# Patient Record
Sex: Male | Born: 1945 | Race: White | Hispanic: No | Marital: Married | Smoking: Never smoker
Health system: Southern US, Community
[De-identification: ages and names within clinical notes are randomized; demographics above are authoritative.]

## PROBLEM LIST (undated history)

## (undated) DIAGNOSIS — C801 Malignant (primary) neoplasm, unspecified: Secondary | ICD-10-CM

## (undated) DIAGNOSIS — I8290 Acute embolism and thrombosis of unspecified vein: Secondary | ICD-10-CM

## (undated) DIAGNOSIS — Z973 Presence of spectacles and contact lenses: Secondary | ICD-10-CM

## (undated) DIAGNOSIS — R600 Localized edema: Secondary | ICD-10-CM

## (undated) DIAGNOSIS — N189 Chronic kidney disease, unspecified: Secondary | ICD-10-CM

## (undated) DIAGNOSIS — M1990 Unspecified osteoarthritis, unspecified site: Secondary | ICD-10-CM

## (undated) DIAGNOSIS — D649 Anemia, unspecified: Secondary | ICD-10-CM

## (undated) DIAGNOSIS — I10 Essential (primary) hypertension: Secondary | ICD-10-CM

## (undated) DIAGNOSIS — N400 Enlarged prostate without lower urinary tract symptoms: Secondary | ICD-10-CM

## (undated) DIAGNOSIS — K219 Gastro-esophageal reflux disease without esophagitis: Secondary | ICD-10-CM

## (undated) HISTORY — DX: Localized edema: R60.0

## (undated) HISTORY — PX: COLONOSCOPY: SHX174

## (undated) HISTORY — PX: CYSTOSCOPY WITH BIOPSY: SHX5122

## (undated) HISTORY — PX: TONSILLECTOMY: SUR1361

## (undated) HISTORY — PX: SHOULDER ARTHROSCOPY W/ ROTATOR CUFF REPAIR: SHX2400

---

## 2001-10-24 HISTORY — PX: TOTAL KNEE ARTHROPLASTY: SHX125

## 2002-02-26 ENCOUNTER — Encounter: Payer: Self-pay | Admitting: Orthopedic Surgery

## 2002-02-28 ENCOUNTER — Inpatient Hospital Stay (HOSPITAL_COMMUNITY): Admission: RE | Admit: 2002-02-28 | Discharge: 2002-03-03 | Payer: Self-pay | Admitting: Orthopedic Surgery

## 2002-02-28 ENCOUNTER — Encounter: Payer: Self-pay | Admitting: Orthopedic Surgery

## 2002-07-23 ENCOUNTER — Ambulatory Visit (HOSPITAL_COMMUNITY): Admission: RE | Admit: 2002-07-23 | Discharge: 2002-07-23 | Payer: Self-pay | Admitting: Orthopedic Surgery

## 2002-07-23 ENCOUNTER — Encounter: Payer: Self-pay | Admitting: Orthopedic Surgery

## 2003-01-07 ENCOUNTER — Encounter: Payer: Self-pay | Admitting: Orthopedic Surgery

## 2003-01-07 ENCOUNTER — Ambulatory Visit (HOSPITAL_COMMUNITY): Admission: RE | Admit: 2003-01-07 | Discharge: 2003-01-07 | Payer: Self-pay | Admitting: Orthopedic Surgery

## 2003-09-08 ENCOUNTER — Ambulatory Visit (HOSPITAL_COMMUNITY): Admission: RE | Admit: 2003-09-08 | Discharge: 2003-09-08 | Payer: Self-pay | Admitting: Orthopedic Surgery

## 2005-10-24 HISTORY — PX: HERNIA REPAIR: SHX51

## 2006-10-24 HISTORY — PX: TOTAL KNEE ARTHROPLASTY: SHX125

## 2006-10-30 ENCOUNTER — Inpatient Hospital Stay (HOSPITAL_COMMUNITY): Admission: RE | Admit: 2006-10-30 | Discharge: 2006-11-02 | Payer: Self-pay | Admitting: Orthopedic Surgery

## 2008-10-03 ENCOUNTER — Encounter: Admission: RE | Admit: 2008-10-03 | Discharge: 2008-10-03 | Payer: Self-pay | Admitting: Orthopedic Surgery

## 2010-10-24 HISTORY — PX: CARPAL TUNNEL RELEASE: SHX101

## 2011-03-11 NOTE — Discharge Summary (Signed)
NAMEJENS, SIEMS                ACCOUNT NO.:  [id number]   MEDICAL RECORD NO.:  [id number]          PATIENT TYPE:  INP   LOCATION:  5033                         FACILITY:  [facility]   PHYSICIAN:  Loreta Ave, M.D. DATE OF BIRTH:  [birth year]   DATE OF ADMISSION:  10/30/2006  DATE OF DISCHARGE:  11/02/2006                               DISCHARGE SUMMARY   FINAL DIAGNOSES:  1. Status post right total knee replacement for end-stage degenerative      joint disease.  2. Hypertension.   HISTORY OF PRESENT ILLNESS:  A 65 year old white male with history of  end-stage DJD, right knee, and chronic pain, who presented to our office  for preop evaluation for a total knee replacement.  He had progressively  worsening pain with failed response with conservative treatment.  Significant decrease in his daily activities due to the ongoing  complaint.   HOSPITAL COURSE:  On October 30, 2006, the patient was taken to the [facility] operating room and a right total knee replacement procedure  performed.  Surgeon Loreta Ave, M.D., and assistant Genene Churn.  Barry Dienes, P.A.-C.  Anesthesia general with femoral nerve block.  There were  no specimens.  EBL minimal.  Tourniquet time 2 hours.  One Hemovac drain  placed.  No surgical or anesthesia complications, and the patient was  transferred to recovery in stable condition.  October 31, 2006, patient  doing well with good pain control.  Vital signs stable and afebrile.  Hemoglobin 11.3, hematocrit 32.2.  Sodium 136, potassium 4.0, chloride  105, CO2 28, BUN 14, creatinine 1.0, glucose 155.  INR 1.3.  The  dressing was clean, dry, and intact.  Calf nontender, neurovascularly  intact distally.  Started on Pepcid 20 mg p.o. daily.  Discontinued  Foley.  PT and OT consults.  Pharmacy protocol Coumadin started.  01 November 2006, the patient looked great, progressing well with therapy.  Good pain control.  Sodium 137, potassium 3.7, chloride 102, CO2 31,  BUN  14, creatinine 1.0, glucose 107.  INR 1.4.  The wound looked good with  staples intact.  No drainage or signs of infection.  Hemovac drain fell  out the night before.  Calf nontender, neurovascularly intact.  Discontinued PCA, O2.  Saline-locked IV.  November 02, 2006, the patient  doing great without complaints.  States he is ready to go home.  Vital  signs stable, afebrile.  WBC 6.3, hemoglobin 9.9, hematocrit 28.2,  platelets 118.  Sodium 140, potassium 4.0, chloride 104, CO2 30, BUN 10,  creatinine 1.0, glucose of 99.  INR 1.5.  The wound looked good with  staples intact.  No drainage or signs of infection.  Calf nontender,  neurovascularly intact.   CONDITION:  Good and stable.   DISPOSITION:  Discharge home.   DISCHARGE MEDICATIONS:  1. Percocet 7.5/325 mg one to two tablets p.o. q.4-6h. p.r.n. pain.  2. Lovenox 40 mg subcutaneous injection daily x3 days.  3. Coumadin pharmacy protocol.  4. Robaxin 500 mg one tablet p.o. q.6h. p.r.n. for spasms.  5. Resume previous home medications.  INSTRUCTIONS:  The patient will work with home health PT/OT to improve  range of motion, strengthening and ambulation.  He will be on Coumadin  x4 weeks postop for DVT prophylaxis.  Daily dry dressing changes.  He is  weightbearing as tolerated.  He will follow up in the office 2 weeks  postop for recheck and possible staple removal.  Return sooner if  needed.      Genene Churn. Denton Meek.      Loreta Ave, M.D.  Electronically Signed    JMO/MEDQ  D:  12/26/2006  T:  12/26/2006  Job:  [id number]

## 2011-03-11 NOTE — Op Note (Signed)
Marc Maldonado, Marc Maldonado NO.:  [id number]   MEDICAL RECORD NO.:  [id number]          PATIENT TYPE:  INP   LOCATION:  5033                         FACILITY:  [facility]   PHYSICIAN:  Loreta Ave, M.D. DATE OF BIRTH:  1946-01-23   DATE OF PROCEDURE:  10/30/2006  DATE OF DISCHARGE:                               OPERATIVE REPORT   PREOPERATIVE DIAGNOSIS:  1. Degenerative arthritis, right knee.  2. Mild flexion contracture.   POSTOPERATIVE DIAGNOSIS:  1. Degenerative arthritis, right knee.  2. Mild flexion contracture.   OPERATIVE PROCEDURE:  Right total knee replacement, minimally invasive  system.  Stryker triathlon component.  Cemented #6 femoral component,  pegged posterior stabilized.  Cemented #7 tibial component with a 9-mm  posterior stabilized polyethelene insert.  Resurfacing 38 x 10 mm medial  offset patellar component.  Soft tissue balancing and medial capsular  release.   SURGEON:  Loreta Ave, M.D.   ASSISTANT:  Genene Churn. Denton Meek., present throughout the entire case.   ANESTHESIA:  General.   BLOOD LOSS:  Minimal.   TOURNIQUET TIME:  Two hours.   SPECIMENS:  None.   CULTURES:  None.   COMPLICATIONS:  None.   DRESSINGS:  Soft compressive with knee immobilizer.   DRAINS:  Hemovac x1.   PROCEDURE:  Patient brought to the operating room and after adequate  anesthesia had been obtained, the right knee examined.  A 3-5 degree  flexion contracture, further flexion 100.  Stable ligaments but very  stiff.  Tourniquet applied.  Prepped and draped in the usual, sterile  fashion.  Exsanguinated with elevation Esmarch.  Tourniquet inflated to  330 mmHg.  A longitudinal incision above the patella down to the tibial  tubercle.  A medial arthrotomy up to the superomedial aspect of the  patella and then a vastus splitting incision for the minimally invasive  system.  Knee exposed.  Grade 4 changes throughout.  Ran into some  menisci and cruciate  ligaments periarticular spurs and loose bodies  removed throughout.  Distal femur exposed.  Intramedullary guide placed.  Twelve millimeter dissection set at 5 degrees of valgus, which restored  mechanical access.  Femur was sized to a #6 component.  Epicondylar  access marked.  The jig was put in place for distal femoral resection  after appropriate sizing for a #6 component.  Definitive cuts made with  appropriate jigs.  When this was complete, the trial was put in place  and found to fit well with excellent capturing and alignment of the  femur.   Attention turned to the tibia.  Extramedullary guide placed.  A 3-degree  posterior slope cut removing 15 mm off the peek of the spine.  Sized to  a #7 component.  All recess examined.  All loose fragments removed.  All  hypertrophic synovial tissue removed.  Care was taken to go up  posteriorly as well.  Patella was then measured and 10 mm resected off  of the posterior aspect.  Sized for a 38 mm component and drill holes  made for that component.  Trials were  then put in place throughout.  A  #6 on the femur, #7 on the tibia and the 38 mm on the patella.  With the  9 mm polyethylene insert attached to the tibia, I had full extension,  full flexion, nicely balanced knee and excellent patellofemoral  tracking.  The tibia was marked for rotation and then hand reamed.  All  trials were removed.  Copious irrigation with the posterior irrigating  device.  Cement prepared, placed on all components which was then firmly  seated removing excessive cement.  Polyethylene attached to the tibia.  Once the cement hardened, the knee was examined.  Full extension, full  flexion.  Good patellofemoral tracking.  As we had reached two hours,  the tourniquet was deflated.  Hemostasis with cautery.  Hemovac placed  and brought out through a separate stab wound.  Wound irrigated once  again.  Arthrotomy closed with #1 Vicryl.  Skin and subcutaneous tissue  with  Vicryl and staples.  Knee injected with Marcaine.  Hemovac clamped.  Sterile compressive dressing applied.  Tourniquet removed.  Knee  immobilizer applied.  Anesthesia reversed.  Brought to recovery room.  Tolerated the surgery well.  No complications.      Loreta Ave, M.D.  Electronically Signed     DFM/MEDQ  D:  10/30/2006  T:  10/31/2006  Job:  [id number]

## 2011-03-11 NOTE — Op Note (Signed)
[place]. [facility]  Patient:    Marc Maldonado, Marc Maldonado Visit Number: [id number] MRN: [id number]          Service Type: SUR Location: 5000 5010 [id number] Attending Physician:  Colbert Ewing Dictated by:   Kaylyn Layer Michelle Piper, M.D. Proc. Date: 02/28/02 Admit Date:  02/28/2002   CC:         Loreta Ave, M.D.   Operative Report  PROCEDURE:  Epidural catheter placement for postoperative pain relief.  DESCRIPTION OF PROCEDURE:  I was consulted by Dr. Eulah Pont to place an epidural catheter into [name] for postoperative pain relief.  I had the opportunity to discuss the risks and benefits with the patient preoperatively, and he agreed to the catheter placement.  At the end of the procedure prior to emergence from anesthesia, the patient was turned in the left lateral decubitus position.  His back was prepped with Betadine.  Using a #17 Tuohy needle, the epidural space was cannulated at the L3-4 interspace in the midline using loss of resistance technique.  The catheter was then threaded 5 cm into the epidural space and the needle removed.  After negative aspiration, 100 mcg of fentanyl and 10 cc of 0.5% Xylocaine was incrementally injected.  The catheter was firmly affixed to the patients back.  He was turned supine, extubated, and taken to recovery in stable condition.  In the PACU, a fentanyl/Marcaine infusion was begun.  He will be followed on the floor by the anesthesiology service. Dictated by:   Kaylyn Layer Michelle Piper, M.D. Attending Physician:  Colbert Ewing DD:  02/28/02 TD:  03/02/02 Job: [phone]

## 2011-03-11 NOTE — Discharge Summary (Signed)
[place]. [facility]  Patient:    Marc Maldonado, Marc Maldonado Visit Number: [id number] MRN: [id number]          Service Type: SUR Location: 5000 5010 [id number] Attending Physician:  Colbert Ewing Dictated by:   Oris Drone Petrarca, P.A.-C. Admit Date:  02/28/2002 Discharge Date: 03/03/2002                             Discharge Summary  ADMISSION DIAGNOSIS:  Advanced degenerative joint disease of the left knee.  DISCHARGE DIAGNOSES: 1. Advanced degenerative joint disease of the left knee. 2. Hypertension. 3. Posthemorrhagic anemia.  OPERATIVE PROCEDURE:  Left total knee replacement.  HISTORY:  A 65 year old male with progressive pain secondary to post-traumatic DJD of the left knee.  He is now having pain with activities of daily living, as well as with every step.  He has significant bone-on-bone, end-stage DJD radiographically.  Now indicated for left total knee replacement.  HOSPITAL COURSE:  This 65 year old white male admitted Feb 28, 2002 after appropriate laboratory studies were obtained as well as 1 gram Ancef IV on call to the operating room.  He was taken to the operating room where he underwent a left total knee replacement.  He tolerated the procedure well.  A Foley was placed intraoperatively.  Continued on Ancef 1 g IV q.8h. x3 doses. Epidural was placed for postoperative pain management.  Heparin 5000 units subcu q.12h. until his Coumadin became therapeutic.  Continued on Tiazac 240 mg daily for his blood pressure.  Hemovacs were placed.  CPM 0-30 degrees 8-10 hours a daily, increasing daily by 10 degrees.  Knee immobilizer for ambulation.  PT, OT, and rehabilitation consults were given.  He is allowed to be weight bearing as tolerated in physical therapy.  He was allowed out of bed to a chair the following day.  His epidural was discontinued on the 10th.  Dressing changes at that time revealed the wound to be benign.  No signs of infection.  The  remainder of his hospital course was uneventful, and he was discharged on May 11, to return back to the office in 10 days for stable removal.  EKG revealed sinus bradycardia with occasional premature atrial complexes, otherwise normal.  Preoperative hemoglobin 14.8, hematocrit 42.0%, white count 6500, platelets 193,000.  Discharge hemoglobin 9.5, hematocrit 26.6%. Chemistries Preop:  Sodium 140, potassium 4.3, chloride 107, CO2 27, glucose 96, BUN 15, creatinine 1.1, calcium 10.0, total protein 6.2, albumin 4.1, AST 27, ALT 24, ALP 56, bilirubin 0.9.  Discharge sodium 138, potassium 3.6, chloride 103, CO2 29, glucose 116, BUN 8, creatinine 1.1, calcium 8.2. Urinalysis was benign for a voided urine.  DISCHARGE MEDICATIONS: 1. He is to continue with his Tiazac as previous. 2. Percocet 5 mg 1-2 tabs q.4-6h. p.r.n. pain. 3. Trinsicon 1 tab 3 times a day with meals. 4. Coumadin 7.5 mg daily; Genevieve Norlander will continue to monitor this.  DISCHARGE INSTRUCTIONS:  He is allowed out of the bed and ambulate with his crutches, weight bearing as tolerated.  No restriction in his diet.  He is to keep his wound clean and dry.  Call if there are any signs of infection. Physical therapy will be started at home.  Blood draws by [place].  He will call is he has any difficulties.  Otherwise will follow up back in the office in about 10 days for staple removal. Dictated by:   Oris Drone. Petrarca, P.A.-C. Attending  Physician:  Colbert Ewing DD:  03/10/02 TD:  03/13/02 Job: [id number]

## 2011-03-11 NOTE — Op Note (Signed)
[place]. [facility]  Patient:    Marc Maldonado, Marc Maldonado Visit Number: [id number] MRN: [id number]          Service Type: SUR Location: 5000 5010 01 Attending Physician:  Colbert Ewing Dictated by:   Loreta Ave, M.D. Proc. Date: 02/28/02 Admit Date:  02/28/2002                             Operative Report  PREOPERATIVE DIAGNOSIS:  End-stage degenerative arthritis - left knee.  POSTOPERATIVE DIAGNOSIS:   End-stage degenerative arthritis - left knee.  OPERATIVE PROCEDURE:  Left total knee replacement with Osteonics prosthesis, spur and loose body removal, appropriate soft tissue balancing, a #11 posterior stabilizing femoral component, cemented #11 tibial component with 12 mm polyethylene insert, and cemented, recessed nonmetal-backed 30 mm patellar component.  SURGEON:  Loreta Ave, M.D.  ASSISTANT:  Arlys John D. Petrarca, P.A.-C.  ANESTHESIA:  General.  BLOOD LOSS:  Minimal.  SPECIMENS:  Excised bone and soft tissue.  CULTURES:  None.  COMPLICATIONS:  None.  DRESSING:  Soft compressive with knee immobilizer.  TOURNIQUET TIME:  80 minutes.  DRAINS:  Hemovac x 2.  DESCRIPTION OF PROCEDURE:  The patient was brought to the operating room and after adequate anesthesia had been obtained, the left knee was examined.  Five degree or more flexion contracture.  Alignment 5-10 degrees of varus barely correctable to neutral.  Stable ligaments.  Further flexion 100 degrees.  The tourniquet was applied, and prepped and draped in usual sterile fashion. Exsanguinated with elevation and Esmarch.  The tourniquet was inflated to 350 mmHg.  Straight incision above the patella down to the tibial tubercle. Medial parapatellar arthrotomy.  The knee was exposed.  Marked reactive effusion was drained.  Numerous loose bodies, hypertrophic synovitis, periarticular spurs were removed throughout.  Grade IV changes throughout. Medial capsular release in  order to achieve a balanced knee taking down the medial capsule off the tibia for the most part.  Distal femur was exposed. The intramedullary guide was placed.  Distal cut set at 5 degrees of valgus removing 10 mm.  Sized for a #11 component.  Jigs put in place, definitive cuts made for the posterior stabilizing component.  Trial put in place and found to fit well.  Trial removed.  Tibial spines were removed with the saw. Collaterals and posterior structures were protected.  The tibia was then cut proximally with the intramedullary guide placed removing 6 mm off the deficient medial side.  A parallel cut.  Five degree posterior slope.  Once complete, this was sized for a #11 component.  The patella was then sized, reamed, and drilled for a 30 mm component.  Marked spurring throughout debrided.  Trials were put on the femur, tibia, and patella, #11 on the femur, #11 on the tibia, and 30 mm on the patella.  A 12 mm spacer was placed.  Full extension, full flexion, good alignment, good stability, good tracking, nicely balanced knee.  The tibia was marked for appropriate rotation and then hand reamed for the component.  All trials were removed.  Copious irrigation with a pulse irrigating device.  Cement was prepared and placed on the tibial component, which was hammered in place.  Excess cement was removed. Polyethylene attached.  The femoral component was seated.  The patella component had cement applied and then was seated down into place and held with a clamp.  All excessive cement had been  removed.  The entire knee was once again was irrigated.  Once the cement had hardened, the knee was irrigated again.  Hemovacs were placed and brought out through separate stab wounds. Reexamined with good motion, good stability, good alignment with full extension and full flexion.  Hemovacs which had been placed were clamped. Arthrotomy closed with #1 Vicryl.  Skin and subcutaneous tissue with  Vicryl and staples.  Margins of the wound and knee injected with Marcaine.  Sterile compressive dressing applied.  Tourniquet deflated and removed.  Knee immobilizer applied.  Anesthesia was reversed, brought to recovery room. Tolerated surgery well, no complications. Dictated by:   Loreta Ave, M.D. Attending Physician:  Colbert Ewing DD:  02/28/02 TD:  03/02/02 Job: [id number]

## 2011-12-06 ENCOUNTER — Other Ambulatory Visit (HOSPITAL_COMMUNITY): Payer: Self-pay | Admitting: Orthopedic Surgery

## 2011-12-06 DIAGNOSIS — M25569 Pain in unspecified knee: Secondary | ICD-10-CM

## 2011-12-09 ENCOUNTER — Encounter (HOSPITAL_COMMUNITY)
Admission: RE | Admit: 2011-12-09 | Discharge: 2011-12-09 | Disposition: A | Discharge status: Home / Self Care | Payer: Medicare Other | Source: Ambulatory Visit | Attending: Orthopedic Surgery | Admitting: Orthopedic Surgery

## 2011-12-09 DIAGNOSIS — M25569 Pain in unspecified knee: Secondary | ICD-10-CM | POA: Insufficient documentation

## 2011-12-09 MED ORDER — TECHNETIUM TC 99M MEDRONATE IV KIT
25.6000 | PACK | Freq: Once | INTRAVENOUS | Status: AC | PRN
  Administered 2011-12-09: 25.6 via INTRAVENOUS

## 2012-05-14 ENCOUNTER — Other Ambulatory Visit: Payer: Self-pay | Admitting: Orthopedic Surgery

## 2012-05-14 DIAGNOSIS — M25512 Pain in left shoulder: Secondary | ICD-10-CM

## 2012-05-18 ENCOUNTER — Other Ambulatory Visit: Payer: Medicare Other

## 2012-05-21 ENCOUNTER — Ambulatory Visit
Admission: RE | Admit: 2012-05-21 | Discharge: 2012-05-21 | Disposition: A | Discharge status: Home / Self Care | Payer: Medicare Other | Source: Ambulatory Visit | Attending: Orthopedic Surgery | Admitting: Orthopedic Surgery

## 2012-05-21 DIAGNOSIS — M25512 Pain in left shoulder: Secondary | ICD-10-CM

## 2012-05-22 ENCOUNTER — Other Ambulatory Visit: Payer: Medicare Other

## 2012-06-01 ENCOUNTER — Encounter (HOSPITAL_BASED_OUTPATIENT_CLINIC_OR_DEPARTMENT_OTHER): Payer: Self-pay | Admitting: *Deleted

## 2012-06-01 NOTE — Progress Notes (Signed)
Pt has had multiple ortho procedures with dr Coralie Common last shoulder SCG-did well To come in early for istat and maybe ekg

## 2012-06-06 NOTE — H&P (Signed)
[place] [address]   [address] [place], [place] [address] [phone] A Division of [facility]  Loreta Ave, M.D.     Robert A. Thurston Hole, M.D.     Lunette Stands, M.D. Eulas Post, M.D.    Buford Dresser, M.D. Estell Harpin, M.D. Genene Churn. Barry Dienes, PA-C            Kirstin A. Shepperson, PA-C [place], OPA-C   RE: [place], [place]                                [phone]      DOB: 04-26-1946 PROGRESS NOTE: 04-24-12 [age] year-old white male who is one week status post right carpal tunnel release.  Returns.  States that his hand is doing well.  He does have a new complaint today of left shoulder pain.  About three weeks ago he put his left hand against a wall to help support himself and he felt a sharp pain in the anterior and lateral aspect of his shoulder.  No previous problems before onset.  Currently has pain with overhead activity and reaching behind his back.  It does bother him at night when he lies on his left side.  No cervical spine or radicular component.  He does have some feeling of popping in his shoulder with movement, but no feeling of instability.    EXAMINATION: Pleasant white male, alert and oriented x 3 and in no acute distress.  Cervical spine unremarkable.  Left shoulder he has good range of motion, but with discomfort.  Positive impingement test.  He is tender over the proximal biceps tendon, but I do not feel an obvious defect.  No swelling or bruising noted.  Right hand wound looks good and sutures intact.  No drainage or signs of infection.  Not much swelling.      X-RAYS: Left shoulder, AP, outlet and axillary views, show a Type II acromion with some AC degenerative changes.  No acute findings.    DISPOSITION:  To try to help give him some relief with his shoulder pain we offered injection.  He will follow up in one week for recheck of his right hand and suture removal.  We will  reevaluate his left shoulder at that time.  States that he does have a wrist splint at home and he will use this.  Do gentle range of motion of his wrist and fingers.    PROCEDURE NOTE: The patient's clinical condition is marked by substantial pain and/or significant functional disability.  Other conservative therapy has not provided relief, is contraindicated, or not appropriate.  There is a reasonable likelihood that injection will significantly improve the patient's pain and/or functional disability. After patient consent the left shoulder was prepped with Betadine after using 3 cc of 1% Xylocaine for local anesthetic, subacromial 1:4 Depo-Medrol/Marcaine injection performed from a posterolateral approach.  Tolerated procedure well without complication.   Genene Churn. Barry Dienes, PA-C   Electronically verified by Loreta Ave, M.D. JMO:jjh   [facility] [address]   [address] , [place] [address] [phone] A Division of [facility]  Loreta Ave, M.D.     Robert A. Thurston Hole, M.D.     Lunette Stands, M.D. Eulas Post, M.D.    Buford Dresser, M.D. Estell Harpin, M.D. Genene Churn. Barry Dienes, PA-C  Kirstin A. Shepperson, PA-C [place], OPA-C   RE: [place], [place]                                [phone]      DOB: 10-Jul-1946 PROGRESS NOTE: 05-29-12 Kinsley comes in for follow up.  Left carpal tunnel release by me on May 14, 2012.  Doing well.  No complaints.  Coming in for suture removal.   The other issue is his left shoulder.  Documented issues in the past.  I have gone over his MRI and report.  Shown and discussed it with him.  Full thickness cuff tear, 2 centimeters retraction, but only mild atrophy.  Of note, when we did his right shoulder back in 2009 he had a similar picture, but 4 centimeters of atrophy.  That was repaired arthroscopically with very good outcome.  No symptoms there.  Good motion.   Good strength.  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  He has no symptoms or complaints there.  I have discussed elective excision at the time of his rotator cuff repair, but he and I both agree there are not enough symptoms to warrant adding another incision there.     DISPOSITION:  1. In regards to carpal tunnel, sutures have been removed.  His wound looks good.  Rehab on his own.  Formal recheck in four weeks. 2. In regards to his shoulder, more than 25 minutes spent face-to-face covering his workup and treatment to date.  Going over his scan and report and showing it to him.  Going through his exam as well.  Proceed with definitive treatment.  Exam under anesthesia, arthroscopy, decompression and cuff repair.  We are not going to do an open excision of his lipoma, as it is not indicated.  He understands and agrees.    Loreta Ave, M.D.   Electronically verified by Loreta Ave, M.D. DFM:jjh

## 2012-06-07 ENCOUNTER — Encounter (HOSPITAL_BASED_OUTPATIENT_CLINIC_OR_DEPARTMENT_OTHER)
Admission: RE | Disposition: A | Discharge status: Home / Self Care | Payer: Self-pay | Source: Ambulatory Visit | Attending: Orthopedic Surgery

## 2012-06-07 ENCOUNTER — Encounter (HOSPITAL_BASED_OUTPATIENT_CLINIC_OR_DEPARTMENT_OTHER): Payer: Self-pay | Admitting: Anesthesiology

## 2012-06-07 ENCOUNTER — Ambulatory Visit (HOSPITAL_BASED_OUTPATIENT_CLINIC_OR_DEPARTMENT_OTHER)
Admission: RE | Admit: 2012-06-07 | Discharge: 2012-06-07 | Disposition: A | Discharge status: Home / Self Care | Payer: Medicare Other | Source: Ambulatory Visit | Attending: Orthopedic Surgery | Admitting: Orthopedic Surgery

## 2012-06-07 ENCOUNTER — Ambulatory Visit (HOSPITAL_BASED_OUTPATIENT_CLINIC_OR_DEPARTMENT_OTHER): Payer: Medicare Other | Admitting: Anesthesiology

## 2012-06-07 ENCOUNTER — Encounter (HOSPITAL_BASED_OUTPATIENT_CLINIC_OR_DEPARTMENT_OTHER): Payer: Self-pay

## 2012-06-07 DIAGNOSIS — M19019 Primary osteoarthritis, unspecified shoulder: Secondary | ICD-10-CM | POA: Insufficient documentation

## 2012-06-07 DIAGNOSIS — I10 Essential (primary) hypertension: Secondary | ICD-10-CM | POA: Insufficient documentation

## 2012-06-07 DIAGNOSIS — M75120 Complete rotator cuff tear or rupture of unspecified shoulder, not specified as traumatic: Secondary | ICD-10-CM | POA: Insufficient documentation

## 2012-06-07 DIAGNOSIS — M25819 Other specified joint disorders, unspecified shoulder: Secondary | ICD-10-CM | POA: Insufficient documentation

## 2012-06-07 DIAGNOSIS — Z4789 Encounter for other orthopedic aftercare: Secondary | ICD-10-CM

## 2012-06-07 DIAGNOSIS — M66329 Spontaneous rupture of flexor tendons, unspecified upper arm: Secondary | ICD-10-CM | POA: Insufficient documentation

## 2012-06-07 HISTORY — DX: Essential (primary) hypertension: I10

## 2012-06-07 HISTORY — DX: Benign prostatic hyperplasia without lower urinary tract symptoms: N40.0

## 2012-06-07 HISTORY — DX: Presence of spectacles and contact lenses: Z97.3

## 2012-06-07 HISTORY — DX: Unspecified osteoarthritis, unspecified site: M19.90

## 2012-06-07 SURGERY — SHOULDER ARTHROSCOPY WITH ROTATOR CUFF REPAIR AND SUBACROMIAL DECOMPRESSION
Anesthesia: General | Site: Shoulder | Laterality: Left | Wound class: Clean

## 2012-06-07 MED ORDER — ONDANSETRON HCL 4 MG/2ML IJ SOLN
INTRAMUSCULAR | Status: DC | PRN
  Administered 2012-06-07: 4 mg via INTRAVENOUS

## 2012-06-07 MED ORDER — FENTANYL CITRATE 0.05 MG/ML IJ SOLN
50.0000 ug | INTRAMUSCULAR | Status: DC | PRN
  Administered 2012-06-07: 100 ug via INTRAVENOUS

## 2012-06-07 MED ORDER — EPHEDRINE SULFATE 50 MG/ML IJ SOLN
INTRAMUSCULAR | Status: DC | PRN
  Administered 2012-06-07 (×3): 10 mg via INTRAVENOUS

## 2012-06-07 MED ORDER — PROPOFOL 10 MG/ML IV EMUL
INTRAVENOUS | Status: DC | PRN
  Administered 2012-06-07: 250 mg via INTRAVENOUS

## 2012-06-07 MED ORDER — MIDAZOLAM HCL 2 MG/2ML IJ SOLN
1.0000 mg | INTRAMUSCULAR | Status: DC | PRN
  Administered 2012-06-07: 2 mg via INTRAVENOUS

## 2012-06-07 MED ORDER — GLYCOPYRROLATE 0.2 MG/ML IJ SOLN
INTRAMUSCULAR | Status: DC | PRN
  Administered 2012-06-07: 0.2 mg via INTRAVENOUS

## 2012-06-07 MED ORDER — CEFAZOLIN SODIUM 1-5 GM-% IV SOLN
1.0000 g | INTRAVENOUS | Status: AC
  Administered 2012-06-07: 2 g via INTRAVENOUS

## 2012-06-07 MED ORDER — LACTATED RINGERS IV SOLN
INTRAVENOUS | Status: DC
  Administered 2012-06-07 (×2): via INTRAVENOUS

## 2012-06-07 MED ORDER — OXYCODONE HCL 5 MG PO TABS: 5.0000 mg | ORAL_TABLET | Freq: Once | ORAL | Status: DC | PRN

## 2012-06-07 MED ORDER — METOCLOPRAMIDE HCL 5 MG/ML IJ SOLN: 10.0000 mg | Freq: Once | INTRAMUSCULAR | Status: DC | PRN

## 2012-06-07 MED ORDER — HYDROMORPHONE HCL PF 1 MG/ML IJ SOLN: 0.2500 mg | INTRAMUSCULAR | Status: DC | PRN

## 2012-06-07 MED ORDER — OXYCODONE HCL 5 MG/5ML PO SOLN: 5.0000 mg | Freq: Once | ORAL | Status: DC | PRN

## 2012-06-07 MED ORDER — DEXAMETHASONE SODIUM PHOSPHATE 4 MG/ML IJ SOLN
INTRAMUSCULAR | Status: DC | PRN
  Administered 2012-06-07: 10 mg via INTRAVENOUS

## 2012-06-07 MED ORDER — SODIUM CHLORIDE 0.9 % IR SOLN
Status: DC | PRN
  Administered 2012-06-07: 15000 mL

## 2012-06-07 MED ORDER — BUPIVACAINE HCL (PF) 0.5 % IJ SOLN
INTRAMUSCULAR | Status: DC | PRN
  Administered 2012-06-07: 15 mL

## 2012-06-07 SURGICAL SUPPLY — 73 items
ANCH SUT SWLK 19.1X5.5 CLS EL (Anchor) ×2 IMPLANT
ANCHOR PEEK SWIVEL LOCK 5.5 (Anchor) ×2 IMPLANT
APL SKNCLS STERI-STRIP NONHPOA (GAUZE/BANDAGES/DRESSINGS)
BENZOIN TINCTURE PRP APPL 2/3 (GAUZE/BANDAGES/DRESSINGS) IMPLANT
BLADE CUTTER GATOR 3.5 (BLADE) ×3 IMPLANT
BLADE CUTTER MENIS 5.5 (BLADE) IMPLANT
BLADE GREAT WHITE 4.2 (BLADE) ×2 IMPLANT
BLADE SURG 15 STRL LF DISP TIS (BLADE) IMPLANT
BLADE SURG 15 STRL SS (BLADE)
BUR OVAL 6.0 (BURR) ×2 IMPLANT
CANISTER OMNI JUG 16 LITER (MISCELLANEOUS) ×2 IMPLANT
CANISTER SUCTION 2500CC (MISCELLANEOUS) IMPLANT
CANNULA TWIST IN 8.25X7CM (CANNULA) ×1 IMPLANT
CLOTH BEACON ORANGE TIMEOUT ST (SAFETY) ×2 IMPLANT
DECANTER SPIKE VIAL GLASS SM (MISCELLANEOUS) IMPLANT
DRAPE OEC MINIVIEW 54X84 (DRAPES) IMPLANT
DRAPE STERI 35X30 U-POUCH (DRAPES) ×2 IMPLANT
DRAPE U-SHAPE 47X51 STRL (DRAPES) ×2 IMPLANT
DRAPE U-SHAPE 76X120 STRL (DRAPES) ×4 IMPLANT
DRSG PAD ABDOMINAL 8X10 ST (GAUZE/BANDAGES/DRESSINGS) ×2 IMPLANT
DURAPREP 26ML APPLICATOR (WOUND CARE) ×2 IMPLANT
ELECT MENISCUS 165MM 90D (ELECTRODE) ×2 IMPLANT
ELECT NDL TIP 2.8 STRL (NEEDLE) IMPLANT
ELECT NEEDLE TIP 2.8 STRL (NEEDLE) IMPLANT
ELECT REM PT RETURN 9FT ADLT (ELECTROSURGICAL) ×2
ELECTRODE REM PT RTRN 9FT ADLT (ELECTROSURGICAL) ×1 IMPLANT
GAUZE XEROFORM 1X8 LF (GAUZE/BANDAGES/DRESSINGS) ×2 IMPLANT
GLOVE BIOGEL M STRL SZ7.5 (GLOVE) ×1 IMPLANT
GLOVE BIOGEL PI IND STRL 8 (GLOVE) ×1 IMPLANT
GLOVE BIOGEL PI INDICATOR 8 (GLOVE) ×2
GLOVE ORTHO TXT STRL SZ7.5 (GLOVE) ×4 IMPLANT
GOWN PREVENTION PLUS XLARGE (GOWN DISPOSABLE) ×2 IMPLANT
GOWN PREVENTION PLUS XXLARGE (GOWN DISPOSABLE) ×1 IMPLANT
GOWN STRL REIN 2XL XLG LVL4 (GOWN DISPOSABLE) ×2 IMPLANT
NDL SCORPION MULTI FIRE (NEEDLE) IMPLANT
NDL SUT 6 .5 CRC .975X.05 MAYO (NEEDLE) IMPLANT
NEEDLE MAYO TAPER (NEEDLE)
NEEDLE SCORPION MULTI FIRE (NEEDLE) ×2 IMPLANT
NS IRRIG 1000ML POUR BTL (IV SOLUTION) IMPLANT
PACK ARTHROSCOPY DSU (CUSTOM PROCEDURE TRAY) ×2 IMPLANT
PACK BASIN DAY SURGERY FS (CUSTOM PROCEDURE TRAY) ×2 IMPLANT
PASSER SUT SWANSON 36MM LOOP (INSTRUMENTS) IMPLANT
PENCIL BUTTON HOLSTER BLD 10FT (ELECTRODE) ×2 IMPLANT
SET ARTHROSCOPY TUBING (MISCELLANEOUS) ×2
SET ARTHROSCOPY TUBING LN (MISCELLANEOUS) ×1 IMPLANT
SLEEVE SCD COMPRESS KNEE MED (MISCELLANEOUS) ×1 IMPLANT
SLING ARM FOAM STRAP LRG (SOFTGOODS) ×1 IMPLANT
SLING ARM FOAM STRAP MED (SOFTGOODS) IMPLANT
SLING ARM FOAM STRAP XLG (SOFTGOODS) IMPLANT
SLING ARM IMMOBILIZER LRG (SOFTGOODS) IMPLANT
SLING ARM IMMOBILIZER MED (SOFTGOODS) IMPLANT
SPONGE GAUZE 4X4 12PLY (GAUZE/BANDAGES/DRESSINGS) ×3 IMPLANT
SPONGE LAP 4X18 X RAY DECT (DISPOSABLE) IMPLANT
STRIP CLOSURE SKIN 1/2X4 (GAUZE/BANDAGES/DRESSINGS) IMPLANT
SUCTION FRAZIER TIP 10 FR DISP (SUCTIONS) IMPLANT
SUT ETHIBOND 2 OS 4 DA (SUTURE) IMPLANT
SUT ETHILON 2 0 FS 18 (SUTURE) IMPLANT
SUT ETHILON 3 0 PS 1 (SUTURE) ×1 IMPLANT
SUT FIBERWIRE #2 38 T-5 BLUE (SUTURE)
SUT RETRIEVER MED (INSTRUMENTS) IMPLANT
SUT STEEL 4 (SUTURE) IMPLANT
SUT STEEL 5 (SUTURE) IMPLANT
SUT TIGER TAPE 7 IN WHITE (SUTURE) ×1 IMPLANT
SUT VIC AB 0 CT1 27 (SUTURE)
SUT VIC AB 0 CT1 27XBRD ANBCTR (SUTURE) IMPLANT
SUT VIC AB 2-0 SH 27 (SUTURE)
SUT VIC AB 2-0 SH 27XBRD (SUTURE) IMPLANT
SUT VIC AB 3-0 FS2 27 (SUTURE) IMPLANT
SUTURE FIBERWR #2 38 T-5 BLUE (SUTURE) IMPLANT
TAPE FIBER 2MM 7IN #2 BLUE (SUTURE) ×1 IMPLANT
TOWEL OR 17X24 6PK STRL BLUE (TOWEL DISPOSABLE) ×2 IMPLANT
WATER STERILE IRR 1000ML POUR (IV SOLUTION) ×2 IMPLANT
YANKAUER SUCT BULB TIP NO VENT (SUCTIONS) IMPLANT

## 2012-06-07 NOTE — Anesthesia Postprocedure Evaluation (Signed)
Anesthesia Post Note  Patient: Marc Maldonado  Procedure(s) Performed: Procedure(s) (LRB): SHOULDER ARTHROSCOPY WITH ROTATOR CUFF REPAIR AND SUBACROMIAL DECOMPRESSION (Left)  Anesthesia type: General  Patient location: PACU  Post pain: Pain level controlled  Post assessment: Patient's Cardiovascular Status Stable  Last Vitals:  Filed Vitals:   06/07/12 1315  BP: 112/67  Pulse: 62  Temp:   Resp: 10    Post vital signs: Reviewed and stable  Level of consciousness: alert  Complications: No apparent anesthesia complications

## 2012-06-07 NOTE — Anesthesia Preprocedure Evaluation (Signed)
Anesthesia Evaluation  Patient identified by MRN, date of birth, ID band Patient awake    Reviewed: Allergy & Precautions, H&P , NPO status , Patient's Chart, lab work & pertinent test results, reviewed documented beta blocker date and time   Airway Mallampati: II TM Distance: >3 FB Neck ROM: full    Dental   Pulmonary neg pulmonary ROS,  breath sounds clear to auscultation        Cardiovascular hypertension, Pt. on medications Rhythm:regular     Neuro/Psych negative neurological ROS  negative psych ROS   GI/Hepatic negative GI ROS, Neg liver ROS,   Endo/Other  negative endocrine ROS  Renal/GU negative Renal ROS  negative genitourinary   Musculoskeletal   Abdominal   Peds  Hematology negative hematology ROS (+)   Anesthesia Other Findings See surgeon's H&P   Reproductive/Obstetrics negative OB ROS                           Anesthesia Physical Anesthesia Plan  ASA: II  Anesthesia Plan: General   Post-op Pain Management:    Induction: Intravenous  Airway Management Planned: Oral ETT  Additional Equipment:   Intra-op Plan:   Post-operative Plan: Extubation in OR  Informed Consent: I have reviewed the patients History and Physical, chart, labs and discussed the procedure including the risks, benefits and alternatives for the proposed anesthesia with the patient or authorized representative who has indicated his/her understanding and acceptance.   Dental Advisory Given  Plan Discussed with: CRNA and Surgeon  Anesthesia Plan Comments:         Anesthesia Quick Evaluation

## 2012-06-07 NOTE — Transfer of Care (Signed)
Immediate Anesthesia Transfer of Care Note  Patient: Marc Maldonado  Procedure(s) Performed: Procedure(s) (LRB): SHOULDER ARTHROSCOPY WITH ROTATOR CUFF REPAIR AND SUBACROMIAL DECOMPRESSION (Left)  Patient Location: PACU  Anesthesia Type: GA combined with regional for post-op pain  Level of Consciousness: sedated  Airway & Oxygen Therapy: Patient Spontanous Breathing and Patient connected to face mask oxygen  Post-op Assessment: Report given to PACU RN and Post -op Vital signs reviewed and stable  Post vital signs: Reviewed and stable  Complications: No apparent anesthesia complications

## 2012-06-07 NOTE — Anesthesia Procedure Notes (Addendum)
Anesthesia Regional Block:  Interscalene brachial plexus block  Pre-Anesthetic Checklist: ,, timeout performed, Correct Patient, Correct Site, Correct Laterality, Correct Procedure, Correct Position, site marked, Risks and benefits discussed,  Surgical consent,  Pre-op evaluation,  At surgeon's request and post-op pain management  Laterality: Left  Prep: chloraprep       Needles:   Needle Type: Other   (Arrow Echogenic)   Needle Length: 9cm  Needle Gauge: 21    Additional Needles:  Procedures: ultrasound guided Interscalene brachial plexus block Narrative:  Start time: 06/07/2012 10:41 AM End time: 06/07/2012 10:48 AM Injection made incrementally with aspirations every 5 mL.  Performed by: Personally  Anesthesiologist: Aldona Lento, MD  Additional Notes: Ultrasound guidance used to: id relevant anatomy, confirm needle position, local anesthetic spread, avoidance of vascular puncture. Picture saved. No complications. Block performed personally by Janetta Hora. Gelene Mink, MD    Interscalene brachial plexus block Procedure Name: Intubation Date/Time: 06/07/2012 11:04 AM Performed by: Gar Gibbon Pre-anesthesia Checklist: Patient identified, Emergency Drugs available, Suction available and Patient being monitored Patient Re-evaluated:Patient Re-evaluated prior to inductionOxygen Delivery Method: Circle System Utilized Preoxygenation: Pre-oxygenation with 100% oxygen Intubation Type: IV induction Ventilation: Mask ventilation without difficulty Laryngoscope Size: Miller and 3 Grade View: Grade III Tube type: Oral Number of attempts: 1 Airway Equipment and Method: stylet and oral airway Placement Confirmation: ETT inserted through vocal cords under direct vision,  positive ETCO2 and breath sounds checked- equal and bilateral Secured at: 22 cm Tube secured with: Tape Dental Injury: Teeth and Oropharynx as per pre-operative assessment

## 2012-06-07 NOTE — Progress Notes (Signed)
Assisted Dr. Frederick with left, ultrasound guided, supraclavicular block. Side rails up, monitors on throughout procedure. See vital signs in flow sheet. Tolerated Procedure well. 

## 2012-06-07 NOTE — Interval H&P Note (Signed)
History and Physical Interval Note:  06/07/2012 7:39 AM  Marc Maldonado  has presented today for surgery, with the diagnosis of left shoulder impengement syndrome, degenerative arthristis, ac joint complete rupture of rotator cuff  The various methods of treatment have been discussed with the patient and family. After consideration of risks, benefits and other options for treatment, the patient has consented to  Procedure(s) (LRB): SHOULDER ARTHROSCOPY WITH ROTATOR CUFF REPAIR AND SUBACROMIAL DECOMPRESSION (Left) as a surgical intervention .  The patient's history has been reviewed, patient examined, no change in status, stable for surgery.  I have reviewed the patient's chart and labs.  Questions were answered to the patient's satisfaction.     [name]

## 2012-06-07 NOTE — Brief Op Note (Signed)
06/07/2012  12:31 PM  PATIENT:  Marc Maldonado  66 y.o. male  PRE-OPERATIVE DIAGNOSIS:  left shoulder impengement syndrome, degenerative arthristis, ac joint complete rupture of rotator cuff  POST-OPERATIVE DIAGNOSIS:  Left Shoulder Impingement, Degenerative ARthritis, Rotator Cuff Tear  PROCEDURE:  Procedure(s) (LRB): SHOULDER ARTHROSCOPY WITH ROTATOR CUFF REPAIR AND SUBACROMIAL DECOMPRESSION (Left), DCE  SURGEON:  Surgeon(s) and Role:    * Loreta Ave, MD - Primary  PHYSICIAN ASSISTANT: Zonia Kief M   ANESTHESIA:   regional and general  EBL:  Total I/O In: 1000 [I.V.:1000] Out: -   SPECIMEN:  No Specimen  DISPOSITION OF SPECIMEN:  N/A  COUNTS:  YES  TOURNIQUET:  * No tourniquets in log *   PATIENT DISPOSITION:  PACU - hemodynamically stable.

## 2012-06-08 NOTE — Op Note (Signed)
Marc Maldonado, Marc Maldonado NO.:  [id number]  MEDICAL RECORD NO.:  [id number]  LOCATION:                                 FACILITY:  PHYSICIAN:  Loreta Ave, M.D. DATE OF BIRTH:  Jul 11, 1946  DATE OF PROCEDURE:  06/07/2012 DATE OF DISCHARGE:                              OPERATIVE REPORT   PREOPERATIVE DIAGNOSES:  Left shoulder full-thickness tear rotator cuff. Impingement.  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 Degenerative joint disease acromioclavicular joint.  POSTOPERATIVE DIAGNOSES:  Left shoulder full-thickness tear rotator cuff.  Impingement.  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 Degenerative joint disease acromioclavicular joint with a torn partially retracted supraspinatus tendon throughout the crescent region, and anterior cable unroofing the biceps.  Marked intertendinous tearing, but reparable.  Degenerative circumferential tearing labrum.  Grade 4 changes Left shoulder full-thickness tear rotator cuff.  Impingement. 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  Degenerative joint disease acromioclavicular joint with spurring.  PROCEDURE:  Left shoulder exam under anesthesia, arthroscopy. Debridement of rotator cuff with mobilization.  Debridement of labrum. Bursectomy, acromioplasty, CA ligament release.  Excision of distal clavicle.  Arthroscopic-assisted rotator cuff repair, fiber weave suture x2, SwiveLock anchors x2.  SURGEON:  Loreta Ave, M.D.  ASSISTANT:  Genene Churn. Denton Meek., present throughout the entire case, necessary for timely completion of procedure.  ANESTHESIA:  General.  BLOOD LOSS:  Minimal.  SPECIMENS:  None.  CULTURES:  None.  COMPLICATIONS:  None.  DRESSINGS:  Soft compressive with shoulder immobilizer.  PROCEDURE:  The patient was brought to operating room, placed on the operating table in supine position.  After adequate anesthesia had been obtained, shoulder examined.  Full  motion, stable shoulder.  Placed in beach-chair position on the shoulder positioner, prepped and draped in usual sterile fashion.  Three portals anterior, posterior, and lateral. Arthroscope introduced, shoulder distended and inspected. Circumferential tearing labrum debrided.  Biceps tendon completely absent from the shoulder.  Rotator cuff torn throughout the entire crescent region, anterior cable, supraspinatus debrided.  Articular cartilage and the shoulder otherwise looked good.  Cannula redirected subacromially.  Cuff was debrided and mobilized.  Type 2 acromion. Bursa was resected.  Acromioplasty to a type 1 acromion releasing CA ligament.  Distal clavicle exposed.  Grade 4 changes spurring. Periarticular spurs lateral centimeter of the clavicle resected. Adequacy of decompression clavicle excision confirmed viewing from all portals.  I then placed a cannula in the lateral portal.  Cuff was captured with 2 horizontal mattress sutures.  Tuberosity roughened. Both sutures were then firmly anchored down into the tuberosity with 2 SwiveLock anchors yielding a nice firm watertight closure of the cuff. Entire shoulder examined.  No other findings appreciated.  I did add a forth posterolateral portal to facilitate visualization during cuff repair.  Portals were injected with Marcaine and closed with nylon. Sterile compressive dressing applied.  Shoulder immobilizer applied. Anesthesia reversed.  Brought to the recovery room.  Tolerated surgery well.  No complications.     Loreta Ave, M.D.     DFM/MEDQ  D:  06/07/2012  T:  06/08/2012  Job:  [id number]

## 2013-07-09 ENCOUNTER — Other Ambulatory Visit: Payer: Self-pay | Admitting: Orthopedic Surgery

## 2013-07-15 ENCOUNTER — Encounter (HOSPITAL_COMMUNITY): Payer: Self-pay | Admitting: Pharmacy Technician

## 2013-07-15 ENCOUNTER — Other Ambulatory Visit: Payer: Self-pay | Admitting: Orthopedic Surgery

## 2013-07-15 NOTE — H&P (Signed)
TOTAL KNEE REVISION ADMISSION H&P  Patient is being admitted for right revision total knee arthroplasty.  Subjective:  Chief Complaint:right knee pain.  HPI: Marc Maldonado, 67 y.o. male, has a history of pain and functional disability in the right knee(s) due to failed previous arthroplasty and patient has failed non-surgical conservative treatments for greater than 12 weeks to include NSAID's and/or analgesics, corticosteriod injections, supervised PT with diminished ADL's post treatment, use of assistive devices and activity modification. The indications for the revision of the total knee arthroplasty are loosening of one or more components. Onset of symptoms was gradual starting 2 years ago with gradually worsening course since that time.  Prior procedures on the right knee(s) include arthroplasty.  Patient currently rates pain in the right knee(s) at 10 out of 10 with activity. There is night pain, worsening of pain with activity and weight bearing, pain that interferes with activities of daily living and joint swelling.  Patient has evidence of prosthetic loosening by imaging studies. This condition presents safety issues increasing the risk of falls.  There is no current active infection.  There are no active problems to display for this patient.  Past Medical History  Diagnosis Date  . Arthritis   . BPH (benign prostatic hyperplasia)   . DJD (degenerative joint disease)   . Wears glasses   . Hypertension     Past Surgical History  Procedure Laterality Date  . Total knee arthroplasty  2008    rt  . Total knee arthroplasty  2003    left  . Tonsillectomy    . Carpal tunnel release  2012    rt and lt  . Hernia repair  2007    umb hr  . Cystoscopy with biopsy      bladder tumor  . Colonoscopy    . Shoulder arthroscopy w/ rotator cuff repair  2010    rt     (Not in a hospital admission) No Known Allergies  History  Substance Use Topics  . Smoking status: Never Smoker   .  Smokeless tobacco: Not on file  . Alcohol Use: No     Comment: not in 48yr    No family history on file.    Review of Systems  Musculoskeletal: Positive for joint pain.  All other systems reviewed and are negative.     Objective:  Physical Exam  Constitutional: He is oriented to person, place, and time. He appears well-developed and well-nourished.  HENT:  Head: Normocephalic and atraumatic.  Eyes: Conjunctivae and EOM are normal. Pupils are equal, round, and reactive to light.  Neck: Neck supple.  Cardiovascular: Normal rate, regular rhythm and normal heart sounds.   Respiratory: Effort normal and breath sounds normal.  GI: Soft. Bowel sounds are normal.  Musculoskeletal:  Examination of the right knee reveals good range of motion.  There is some tenderness to palpation of the right knee.  Good strength bilaterally.  Collateral ligaments appear stable.  Patient is neurovascularly intact.    Neurological: He is alert and oriented to person, place, and time.  Skin: Skin is warm and dry.  Psychiatric: He has a normal mood and affect. His behavior is normal.    Vital signs in last 24 hours: Temperature: 97.7.  Blood pressure: 143/84.  Pulse: 73.  O2 SAT: 97% on room air.   Labs:  Estimated body mass index is 35.63 kg/(m^2) as calculated from the following:   Height as of 06/07/12: 6\' 1"  (1.854 m).  Weight as of 06/07/12: 122.471 kg (270 lb).  Imaging Review Plain radiographs demonstrate severe degenerative joint disease of the right knee(s). The overall alignment is mild varus.There is evidence of loosening of the tibial components. The bone quality appears to be good for age and reported activity level.   Assessment/Plan:  End stage arthritis, right knee(s) with failed previous arthroplasty.   The patient history, physical examination, clinical judgment of the provider and imaging studies are consistent with end stage degenerative joint disease of the right knee(s),  previous total knee arthroplasty. Revision total knee arthroplasty is deemed medically necessary. The treatment options including medical management, injection therapy, arthroscopy and revision arthroplasty were discussed at length. The risks and benefits of revision total knee arthroplasty were presented and reviewed. The risks due to aseptic loosening, infection, stiffness, patella tracking problems, thromboembolic complications and other imponderables were discussed. The patient acknowledged the explanation, agreed to proceed with the plan and consent was signed. Patient is being admitted for inpatient treatment for surgery, pain control, PT, OT, prophylactic antibiotics, VTE prophylaxis, progressive ambulation and ADL's and discharge planning.The patient is planning to be discharged home with home health services

## 2013-07-16 ENCOUNTER — Ambulatory Visit (HOSPITAL_COMMUNITY)
Admission: RE | Admit: 2013-07-16 | Discharge: 2013-07-16 | Disposition: A | Discharge status: Home / Self Care | Payer: Medicare Other | Source: Ambulatory Visit | Attending: Orthopedic Surgery | Admitting: Orthopedic Surgery

## 2013-07-16 ENCOUNTER — Encounter (HOSPITAL_COMMUNITY): Payer: Self-pay

## 2013-07-16 ENCOUNTER — Encounter (HOSPITAL_COMMUNITY)
Admission: RE | Admit: 2013-07-16 | Discharge: 2013-07-16 | Disposition: A | Discharge status: Home / Self Care | Payer: Medicare Other | Source: Ambulatory Visit | Attending: Orthopedic Surgery | Admitting: Orthopedic Surgery

## 2013-07-16 DIAGNOSIS — Z01818 Encounter for other preprocedural examination: Secondary | ICD-10-CM | POA: Insufficient documentation

## 2013-07-16 DIAGNOSIS — Z01810 Encounter for preprocedural cardiovascular examination: Secondary | ICD-10-CM | POA: Insufficient documentation

## 2013-07-16 DIAGNOSIS — M25569 Pain in unspecified knee: Secondary | ICD-10-CM | POA: Insufficient documentation

## 2013-07-16 DIAGNOSIS — Z96659 Presence of unspecified artificial knee joint: Secondary | ICD-10-CM | POA: Insufficient documentation

## 2013-07-16 DIAGNOSIS — N189 Chronic kidney disease, unspecified: Secondary | ICD-10-CM | POA: Insufficient documentation

## 2013-07-16 DIAGNOSIS — Z01812 Encounter for preprocedural laboratory examination: Secondary | ICD-10-CM | POA: Insufficient documentation

## 2013-07-16 DIAGNOSIS — I129 Hypertensive chronic kidney disease with stage 1 through stage 4 chronic kidney disease, or unspecified chronic kidney disease: Secondary | ICD-10-CM | POA: Insufficient documentation

## 2013-07-16 HISTORY — DX: Gastro-esophageal reflux disease without esophagitis: K21.9

## 2013-07-16 HISTORY — DX: Malignant (primary) neoplasm, unspecified: C80.1

## 2013-07-16 HISTORY — DX: Chronic kidney disease, unspecified: N18.9

## 2013-07-16 LAB — CBC WITH DIFFERENTIAL/PLATELET
Eosinophils Relative: 1 % (ref 0–5)
HCT: 40.4 % (ref 39.0–52.0)
Hemoglobin: 14.4 g/dL (ref 13.0–17.0)
Lymphocytes Relative: 18 % (ref 12–46)
Lymphs Abs: 1.2 10*3/uL (ref 0.7–4.0)
MCV: 95.3 fL (ref 78.0–100.0)
Monocytes Absolute: 0.5 10*3/uL (ref 0.1–1.0)
Monocytes Relative: 7 % (ref 3–12)
Neutro Abs: 5.1 10*3/uL (ref 1.7–7.7)
RBC: 4.24 MIL/uL (ref 4.22–5.81)
WBC: 6.8 10*3/uL (ref 4.0–10.5)

## 2013-07-16 LAB — COMPREHENSIVE METABOLIC PANEL
ALT: 19 U/L (ref 0–53)
AST: 23 U/L (ref 0–37)
Alkaline Phosphatase: 69 U/L (ref 39–117)
CO2: 28 mEq/L (ref 19–32)
Calcium: 9.9 mg/dL (ref 8.4–10.5)
Chloride: 103 mEq/L (ref 96–112)
Creatinine, Ser: 1.21 mg/dL (ref 0.50–1.35)
GFR calc Af Amer: 70 mL/min — ABNORMAL LOW (ref >90)
GFR calc non Af Amer: 61 mL/min — ABNORMAL LOW (ref >90)
Glucose, Bld: 103 mg/dL — ABNORMAL HIGH (ref 70–99)
Sodium: 141 mEq/L (ref 135–145)
Total Bilirubin: 0.8 mg/dL (ref 0.3–1.2)

## 2013-07-16 LAB — APTT: aPTT: 30 seconds (ref 24–37)

## 2013-07-16 LAB — SURGICAL PCR SCREEN: MRSA, PCR: NEGATIVE

## 2013-07-16 LAB — URINE MICROSCOPIC-ADD ON

## 2013-07-16 LAB — PROTIME-INR: INR: 1.28 (ref 0.00–1.49)

## 2013-07-16 LAB — URINALYSIS, ROUTINE W REFLEX MICROSCOPIC
Bilirubin Urine: NEGATIVE
Ketones, ur: 15 mg/dL — AB
Leukocytes, UA: NEGATIVE
Nitrite: NEGATIVE
Specific Gravity, Urine: 1.027 (ref 1.005–1.030)
Urobilinogen, UA: 0.2 mg/dL (ref 0.0–1.0)
pH: 5.5 (ref 5.0–8.0)

## 2013-07-16 LAB — TYPE AND SCREEN: ABO/RH(D): A POS

## 2013-07-16 NOTE — Pre-Procedure Instructions (Signed)
LEVELL TAVANO  07/16/2013   Your procedure is scheduled on:  07/24/2013  Report to [facility] , entrance A   at 8:00 AM.  Call this number if you have problems the morning of surgery: [phone]   Remember:   Do not eat food or drink liquids after midnight. On Tuesday  Take these medicines the morning of surgery with A SIP OF WATER: Flomax, pepcid, if needed    Do not wear jewelry  Do not wear lotions, powders, or perfumes. You may wear deodorant.             Men may shave face and neck.  Do not bring valuables to the hospital.  [facility] is not responsible                   for any belongings or valuables.  Contacts, dentures or bridgework may not be worn into surgery.  Leave suitcase in the car. After surgery it may be brought to your room.  For patients admitted to the hospital, checkout time is 11:00 AM the day of  discharge.   Patients discharged the day of surgery will not be allowed to drive  home.  Name and phone number of your driver: with spouse   Special Instructions: Shower using CHG 2 nights before surgery and the night before surgery.  If you shower the day of surgery use CHG.  Use special wash - you have one bottle of CHG for all showers.  You should use approximately 1/3 of the bottle for each shower.   Please read over the following fact sheets that you were given: Pain Booklet, Coughing and Deep Breathing, Blood Transfusion Information, MRSA Information and Surgical Site Infection Prevention

## 2013-07-17 NOTE — Progress Notes (Signed)
Anesthesia chart review:  Patient is a 67 year old male scheduled for revision of right TKR on 07/24/13 by Dr. Eulah Pont.  History includes non-smoker, obesity, BPH, HTN, GERD, CKD, arthritis, DJD, colon cancer s/p resection (stage 1?), left TKR '03, right TKR '08, right rotator cuff repair '10, left rotator cuff repair '13, bilateral CTR '13. OSA screen was 4. PCP is Dr. Lesli Albee in [place].  Preoperative EKG, CXR, and labs noted. Urine culture is pending.  Preoperative studies appear acceptable from an anesthesia standpoint.  Velna Ochs [facility] Short Stay Center/Anesthesiology Phone [phone] 07/17/2013 12:24 PM

## 2013-07-18 LAB — URINE CULTURE
Colony Count: NO GROWTH
Culture: NO GROWTH

## 2013-07-23 MED ORDER — CEFAZOLIN SODIUM-DEXTROSE 2-3 GM-% IV SOLR
2.0000 g | INTRAVENOUS | Status: AC
  Administered 2013-07-24: 2 g via INTRAVENOUS
  Filled 2013-07-23: qty 50

## 2013-07-24 ENCOUNTER — Inpatient Hospital Stay (HOSPITAL_COMMUNITY): Payer: Medicare Other

## 2013-07-24 ENCOUNTER — Encounter (HOSPITAL_COMMUNITY): Payer: Self-pay | Admitting: Vascular Surgery

## 2013-07-24 ENCOUNTER — Encounter (HOSPITAL_COMMUNITY): Payer: Self-pay | Admitting: *Deleted

## 2013-07-24 ENCOUNTER — Inpatient Hospital Stay (HOSPITAL_COMMUNITY): Payer: Medicare Other | Admitting: Anesthesiology

## 2013-07-24 ENCOUNTER — Encounter (HOSPITAL_COMMUNITY)
Admission: RE | Disposition: A | Discharge status: Home Health | Payer: Self-pay | Source: Ambulatory Visit | Attending: Orthopedic Surgery

## 2013-07-24 ENCOUNTER — Inpatient Hospital Stay (HOSPITAL_COMMUNITY)
Admission: RE | Admit: 2013-07-24 | Discharge: 2013-07-25 | DRG: 468 | Disposition: A | Discharge status: Home Health | Payer: Medicare Other | Source: Ambulatory Visit | Attending: Orthopedic Surgery | Admitting: Orthopedic Surgery

## 2013-07-24 DIAGNOSIS — N529 Male erectile dysfunction, unspecified: Secondary | ICD-10-CM | POA: Diagnosis present

## 2013-07-24 DIAGNOSIS — T84039A Mechanical loosening of unspecified internal prosthetic joint, initial encounter: Principal | ICD-10-CM | POA: Diagnosis present

## 2013-07-24 DIAGNOSIS — M1710 Unilateral primary osteoarthritis, unspecified knee: Secondary | ICD-10-CM | POA: Diagnosis present

## 2013-07-24 DIAGNOSIS — Z96659 Presence of unspecified artificial knee joint: Secondary | ICD-10-CM

## 2013-07-24 DIAGNOSIS — Z79899 Other long term (current) drug therapy: Secondary | ICD-10-CM

## 2013-07-24 DIAGNOSIS — Y831 Surgical operation with implant of artificial internal device as the cause of abnormal reaction of the patient, or of later complication, without mention of misadventure at the time of the procedure: Secondary | ICD-10-CM | POA: Diagnosis present

## 2013-07-24 DIAGNOSIS — K5900 Constipation, unspecified: Secondary | ICD-10-CM | POA: Diagnosis not present

## 2013-07-24 DIAGNOSIS — N400 Enlarged prostate without lower urinary tract symptoms: Secondary | ICD-10-CM | POA: Diagnosis present

## 2013-07-24 DIAGNOSIS — I10 Essential (primary) hypertension: Secondary | ICD-10-CM | POA: Diagnosis present

## 2013-07-24 DIAGNOSIS — Z7982 Long term (current) use of aspirin: Secondary | ICD-10-CM

## 2013-07-24 DIAGNOSIS — K219 Gastro-esophageal reflux disease without esophagitis: Secondary | ICD-10-CM | POA: Diagnosis present

## 2013-07-24 HISTORY — PX: TOTAL KNEE REVISION: SHX996

## 2013-07-24 SURGERY — TOTAL KNEE REVISION
Anesthesia: General | Site: Knee | Laterality: Right | Wound class: Clean

## 2013-07-24 MED ORDER — ONDANSETRON HCL 4 MG/2ML IJ SOLN
INTRAMUSCULAR | Status: DC | PRN
  Administered 2013-07-24: 4 mg via INTRAVENOUS

## 2013-07-24 MED ORDER — PROPOFOL 10 MG/ML IV BOLUS
INTRAVENOUS | Status: DC | PRN
  Administered 2013-07-24: 200 mg via INTRAVENOUS

## 2013-07-24 MED ORDER — NEOSTIGMINE METHYLSULFATE 1 MG/ML IJ SOLN
INTRAMUSCULAR | Status: DC | PRN
  Administered 2013-07-24: 3 mg via INTRAVENOUS

## 2013-07-24 MED ORDER — ROCURONIUM BROMIDE 100 MG/10ML IV SOLN
INTRAVENOUS | Status: DC | PRN
  Administered 2013-07-24: 50 mg via INTRAVENOUS

## 2013-07-24 MED ORDER — ASPIRIN EC 325 MG PO TBEC
325.0000 mg | DELAYED_RELEASE_TABLET | Freq: Every day | ORAL | Status: DC
  Administered 2013-07-25: 325 mg via ORAL
  Filled 2013-07-24 (×2): qty 1

## 2013-07-24 MED ORDER — FAMOTIDINE-CA CARB-MAG HYDROX 10-800-165 MG PO CHEW: 1.0000 | CHEWABLE_TABLET | Freq: Every morning | ORAL | Status: DC

## 2013-07-24 MED ORDER — METOCLOPRAMIDE HCL 5 MG/ML IJ SOLN: 10.0000 mg | Freq: Once | INTRAMUSCULAR | Status: DC | PRN

## 2013-07-24 MED ORDER — BUPIVACAINE LIPOSOME 1.3 % IJ SUSP
20.0000 mL | Freq: Once | INTRAMUSCULAR | Status: DC
  Filled 2013-07-24: qty 20

## 2013-07-24 MED ORDER — HYDROMORPHONE HCL PF 1 MG/ML IJ SOLN
INTRAMUSCULAR | Status: AC
  Filled 2013-07-24: qty 1

## 2013-07-24 MED ORDER — FENTANYL CITRATE 0.05 MG/ML IJ SOLN
INTRAMUSCULAR | Status: DC | PRN
  Administered 2013-07-24: 50 ug via INTRAVENOUS
  Administered 2013-07-24 (×2): 100 ug via INTRAVENOUS

## 2013-07-24 MED ORDER — HYDROMORPHONE HCL PF 1 MG/ML IJ SOLN
0.5000 mg | INTRAMUSCULAR | Status: DC | PRN
  Administered 2013-07-25: 1 mg via INTRAVENOUS
  Filled 2013-07-24: qty 1

## 2013-07-24 MED ORDER — LACTATED RINGERS IV SOLN
INTRAVENOUS | Status: DC
  Administered 2013-07-24: 09:00:00 via INTRAVENOUS

## 2013-07-24 MED ORDER — ZOLPIDEM TARTRATE 5 MG PO TABS: 5.0000 mg | ORAL_TABLET | Freq: Every evening | ORAL | Status: DC | PRN

## 2013-07-24 MED ORDER — BUPIVACAINE LIPOSOME 1.3 % IJ SUSP
INTRAMUSCULAR | Status: DC | PRN
  Administered 2013-07-24: 20 mL

## 2013-07-24 MED ORDER — MENTHOL 3 MG MT LOZG: 1.0000 | LOZENGE | OROMUCOSAL | Status: DC | PRN

## 2013-07-24 MED ORDER — HYDROMORPHONE HCL PF 1 MG/ML IJ SOLN
INTRAMUSCULAR | Status: DC | PRN
  Administered 2013-07-24: .2 mg via INTRAVENOUS

## 2013-07-24 MED ORDER — OXYCODONE HCL 5 MG PO TABS
5.0000 mg | ORAL_TABLET | Freq: Once | ORAL | Status: AC | PRN
  Administered 2013-07-24: 5 mg via ORAL

## 2013-07-24 MED ORDER — CEFAZOLIN SODIUM-DEXTROSE 2-3 GM-% IV SOLR
2.0000 g | Freq: Four times a day (QID) | INTRAVENOUS | Status: AC
  Administered 2013-07-24 – 2013-07-25 (×2): 2 g via INTRAVENOUS
  Filled 2013-07-24 (×2): qty 50

## 2013-07-24 MED ORDER — DOCUSATE SODIUM 100 MG PO CAPS
100.0000 mg | ORAL_CAPSULE | Freq: Two times a day (BID) | ORAL | Status: DC
  Administered 2013-07-24 – 2013-07-25 (×2): 100 mg via ORAL
  Filled 2013-07-24 (×2): qty 1

## 2013-07-24 MED ORDER — LACTATED RINGERS IV SOLN
INTRAVENOUS | Status: DC | PRN
  Administered 2013-07-24 (×2): via INTRAVENOUS

## 2013-07-24 MED ORDER — ACETAMINOPHEN 325 MG PO TABS: 650.0000 mg | ORAL_TABLET | Freq: Four times a day (QID) | ORAL | Status: DC | PRN

## 2013-07-24 MED ORDER — ARTIFICIAL TEARS OP OINT
TOPICAL_OINTMENT | OPHTHALMIC | Status: DC | PRN
  Administered 2013-07-24: 1 via OPHTHALMIC

## 2013-07-24 MED ORDER — METOCLOPRAMIDE HCL 5 MG/ML IJ SOLN: 5.0000 mg | Freq: Three times a day (TID) | INTRAMUSCULAR | Status: DC | PRN

## 2013-07-24 MED ORDER — OXYCODONE HCL 5 MG PO TABS
ORAL_TABLET | ORAL | Status: AC
  Filled 2013-07-24: qty 3

## 2013-07-24 MED ORDER — ONDANSETRON HCL 4 MG/2ML IJ SOLN
4.0000 mg | Freq: Four times a day (QID) | INTRAMUSCULAR | Status: DC | PRN
  Administered 2013-07-24: 4 mg via INTRAVENOUS
  Filled 2013-07-24: qty 2

## 2013-07-24 MED ORDER — CALCIUM-MAGNESIUM-VITAMIN D 500-250-125 MG-MG-UNIT PO TABS: 1.0000 | ORAL_TABLET | Freq: Every day | ORAL | Status: DC

## 2013-07-24 MED ORDER — HYDROMORPHONE HCL PF 1 MG/ML IJ SOLN
0.2500 mg | INTRAMUSCULAR | Status: DC | PRN
  Administered 2013-07-24: 1 mg via INTRAVENOUS

## 2013-07-24 MED ORDER — LISINOPRIL 20 MG PO TABS
20.0000 mg | ORAL_TABLET | Freq: Every day | ORAL | Status: DC
  Administered 2013-07-25: 20 mg via ORAL
  Filled 2013-07-24: qty 1

## 2013-07-24 MED ORDER — CELECOXIB 200 MG PO CAPS
200.0000 mg | ORAL_CAPSULE | Freq: Two times a day (BID) | ORAL | Status: DC
  Administered 2013-07-24 – 2013-07-25 (×2): 200 mg via ORAL
  Filled 2013-07-24 (×3): qty 1

## 2013-07-24 MED ORDER — POTASSIUM CHLORIDE IN NACL 20-0.9 MEQ/L-% IV SOLN
INTRAVENOUS | Status: DC
  Administered 2013-07-24 – 2013-07-25 (×2): via INTRAVENOUS
  Filled 2013-07-24 (×7): qty 1000

## 2013-07-24 MED ORDER — LIDOCAINE HCL (CARDIAC) 20 MG/ML IV SOLN
INTRAVENOUS | Status: DC | PRN
  Administered 2013-07-24: 80 mg via INTRAVENOUS
  Administered 2013-07-24: 20 mg via INTRAVENOUS

## 2013-07-24 MED ORDER — CALCIUM CARBONATE-VITAMIN D 500-200 MG-UNIT PO TABS
1.0000 | ORAL_TABLET | Freq: Every day | ORAL | Status: DC
  Administered 2013-07-25: 08:00:00 1 via ORAL
  Filled 2013-07-24 (×2): qty 1

## 2013-07-24 MED ORDER — DIPHENHYDRAMINE HCL 12.5 MG/5ML PO ELIX
12.5000 mg | ORAL_SOLUTION | ORAL | Status: DC | PRN
  Administered 2013-07-24: 25 mg via ORAL
  Filled 2013-07-24: qty 10

## 2013-07-24 MED ORDER — GLYCOPYRROLATE 0.2 MG/ML IJ SOLN
INTRAMUSCULAR | Status: DC | PRN
  Administered 2013-07-24: 0.2 mg via INTRAVENOUS
  Administered 2013-07-24: .6 mg via INTRAVENOUS

## 2013-07-24 MED ORDER — EPHEDRINE SULFATE 50 MG/ML IJ SOLN
INTRAMUSCULAR | Status: DC | PRN
  Administered 2013-07-24 (×2): 5 mg via INTRAVENOUS

## 2013-07-24 MED ORDER — METOCLOPRAMIDE HCL 10 MG PO TABS: 5.0000 mg | ORAL_TABLET | Freq: Three times a day (TID) | ORAL | Status: DC | PRN

## 2013-07-24 MED ORDER — OXYCODONE HCL 5 MG PO TABS
5.0000 mg | ORAL_TABLET | ORAL | Status: DC | PRN
  Administered 2013-07-24 (×2): 10 mg via ORAL
  Administered 2013-07-25: 5 mg via ORAL
  Filled 2013-07-24: qty 2
  Filled 2013-07-24: qty 1

## 2013-07-24 MED ORDER — BUPIVACAINE HCL (PF) 0.25 % IJ SOLN
INTRAMUSCULAR | Status: DC | PRN
  Administered 2013-07-24: 10 mL

## 2013-07-24 MED ORDER — DEXAMETHASONE SODIUM PHOSPHATE 10 MG/ML IJ SOLN
INTRAMUSCULAR | Status: DC | PRN
  Administered 2013-07-24: 10 mg via INTRAVENOUS

## 2013-07-24 MED ORDER — ACETAMINOPHEN 650 MG RE SUPP: 650.0000 mg | Freq: Four times a day (QID) | RECTAL | Status: DC | PRN

## 2013-07-24 MED ORDER — SODIUM CHLORIDE 0.9 % IR SOLN
Status: DC | PRN
  Administered 2013-07-24: 3000 mL

## 2013-07-24 MED ORDER — FAMOTIDINE 20 MG PO TABS
20.0000 mg | ORAL_TABLET | Freq: Every day | ORAL | Status: DC
  Administered 2013-07-25: 20 mg via ORAL
  Filled 2013-07-24: qty 1

## 2013-07-24 MED ORDER — ONDANSETRON HCL 4 MG PO TABS
4.0000 mg | ORAL_TABLET | Freq: Four times a day (QID) | ORAL | Status: DC | PRN
  Administered 2013-07-25: 4 mg via ORAL
  Filled 2013-07-24: qty 1

## 2013-07-24 MED ORDER — CHLORHEXIDINE GLUCONATE 4 % EX LIQD: 60.0000 mL | Freq: Once | CUTANEOUS | Status: DC

## 2013-07-24 MED ORDER — MULTI-VITAMIN/MINERALS PO TABS: 1.0000 | ORAL_TABLET | Freq: Every day | ORAL | Status: DC

## 2013-07-24 MED ORDER — PHENOL 1.4 % MT LIQD: 1.0000 | OROMUCOSAL | Status: DC | PRN

## 2013-07-24 MED ORDER — METHOCARBAMOL 100 MG/ML IJ SOLN: 500.0000 mg | Freq: Four times a day (QID) | INTRAVENOUS | Status: DC | PRN

## 2013-07-24 MED ORDER — TAMSULOSIN HCL 0.4 MG PO CAPS
0.4000 mg | ORAL_CAPSULE | Freq: Every day | ORAL | Status: DC
  Administered 2013-07-25: 0.4 mg via ORAL
  Filled 2013-07-24 (×2): qty 1

## 2013-07-24 MED ORDER — VERAPAMIL HCL ER 240 MG PO TBCR
240.0000 mg | EXTENDED_RELEASE_TABLET | Freq: Every day | ORAL | Status: DC
  Administered 2013-07-25: 240 mg via ORAL
  Filled 2013-07-24 (×2): qty 1

## 2013-07-24 MED ORDER — OXYCODONE HCL 5 MG/5ML PO SOLN: 5.0000 mg | Freq: Once | ORAL | Status: AC | PRN

## 2013-07-24 MED ORDER — ADULT MULTIVITAMIN W/MINERALS CH
1.0000 | ORAL_TABLET | Freq: Every day | ORAL | Status: DC
  Administered 2013-07-25: 1 via ORAL
  Filled 2013-07-24 (×2): qty 1

## 2013-07-24 MED ORDER — MIDAZOLAM HCL 5 MG/5ML IJ SOLN
INTRAMUSCULAR | Status: DC | PRN
  Administered 2013-07-24: 2 mg via INTRAVENOUS

## 2013-07-24 MED ORDER — METHOCARBAMOL 500 MG PO TABS
500.0000 mg | ORAL_TABLET | Freq: Four times a day (QID) | ORAL | Status: DC | PRN
  Administered 2013-07-24: 500 mg via ORAL
  Filled 2013-07-24: qty 1

## 2013-07-24 SURGICAL SUPPLY — 72 items
APL SKNCLS STERI-STRIP NONHPOA (GAUZE/BANDAGES/DRESSINGS) ×1
BANDAGE ELASTIC 4 VELCRO ST LF (GAUZE/BANDAGES/DRESSINGS) ×1 IMPLANT
BANDAGE ELASTIC 6 VELCRO ST LF (GAUZE/BANDAGES/DRESSINGS) ×2 IMPLANT
BANDAGE ESMARK 6X9 LF (GAUZE/BANDAGES/DRESSINGS) ×1 IMPLANT
BEARING TIBIAL INSERT SZ7 (Orthopedic Implant) IMPLANT
BENZOIN TINCTURE PRP APPL 2/3 (GAUZE/BANDAGES/DRESSINGS) ×1 IMPLANT
BLADE SAG 18X100X1.27 (BLADE) ×2 IMPLANT
BLADE SAW SGTL 13.0X1.19X90.0M (BLADE) ×2 IMPLANT
BLADE SURG ROTATE 9660 (MISCELLANEOUS) IMPLANT
BNDG CMPR 9X6 STRL LF SNTH (GAUZE/BANDAGES/DRESSINGS) ×1
BNDG ESMARK 6X9 LF (GAUZE/BANDAGES/DRESSINGS) ×2
BOOTCOVER CLEANROOM LRG (PROTECTIVE WEAR) ×4 IMPLANT
BOWL SMART MIX CTS (DISPOSABLE) IMPLANT
CEMENT BONE SIMPLEX SPEEDSET (Cement) ×3 IMPLANT
CLOTH BEACON ORANGE TIMEOUT ST (SAFETY) ×2 IMPLANT
COVER BACK TABLE 24X17X13 BIG (DRAPES) IMPLANT
COVER SURGICAL LIGHT HANDLE (MISCELLANEOUS) ×2 IMPLANT
CUFF TOURNIQUET SINGLE 34IN LL (TOURNIQUET CUFF) IMPLANT
DRAPE EXTREMITY T 121X128X90 (DRAPE) ×2 IMPLANT
DRAPE U-SHAPE 47X51 STRL (DRAPES) ×2 IMPLANT
DRSG PAD ABDOMINAL 8X10 ST (GAUZE/BANDAGES/DRESSINGS) ×1 IMPLANT
DURAPREP 26ML APPLICATOR (WOUND CARE) ×2 IMPLANT
ELECT REM PT RETURN 9FT ADLT (ELECTROSURGICAL) ×2
ELECTRODE REM PT RTRN 9FT ADLT (ELECTROSURGICAL) ×1 IMPLANT
EVACUATOR 1/8 PVC DRAIN (DRAIN) IMPLANT
FACESHIELD LNG OPTICON STERILE (SAFETY) ×6 IMPLANT
GAUZE XEROFORM 1X8 LF (GAUZE/BANDAGES/DRESSINGS) ×2 IMPLANT
GAUZE XEROFORM 5X9 LF (GAUZE/BANDAGES/DRESSINGS) ×1 IMPLANT
GLOVE BIO SURGEON STRL SZ 6.5 (GLOVE) ×1 IMPLANT
GLOVE BIO SURGEON STRL SZ8 (GLOVE) ×3 IMPLANT
GLOVE BIOGEL PI IND STRL 8 (GLOVE) ×1 IMPLANT
GLOVE BIOGEL PI INDICATOR 8 (GLOVE) ×1
GLOVE ORTHO TXT STRL SZ7.5 (GLOVE) ×4 IMPLANT
GOWN PREVENTION PLUS XXLARGE (GOWN DISPOSABLE) ×2 IMPLANT
GOWN STRL NON-REIN LRG LVL3 (GOWN DISPOSABLE) ×6 IMPLANT
HANDPIECE INTERPULSE COAX TIP (DISPOSABLE)
IMMOBILIZER KNEE 24 THIGH 36 (MISCELLANEOUS) IMPLANT
IMMOBILIZER KNEE 24 UNIV (MISCELLANEOUS) ×2
KIT BASIN OR (CUSTOM PROCEDURE TRAY) ×2 IMPLANT
KIT ROOM TURNOVER OR (KITS) ×2 IMPLANT
MANIFOLD NEPTUNE II (INSTRUMENTS) ×2 IMPLANT
NS IRRIG 1000ML POUR BTL (IV SOLUTION) ×2 IMPLANT
PACK TOTAL JOINT (CUSTOM PROCEDURE TRAY) ×2 IMPLANT
PAD ARMBOARD 7.5X6 YLW CONV (MISCELLANEOUS) ×4 IMPLANT
PAD CAST 4YDX4 CTTN HI CHSV (CAST SUPPLIES) ×1 IMPLANT
PADDING CAST COTTON 4X4 STRL (CAST SUPPLIES)
PADDING CAST COTTON 6X4 STRL (CAST SUPPLIES) ×2 IMPLANT
PATELLA A40 11MM (Orthopedic Implant) ×1 IMPLANT
SET HNDPC FAN SPRY TIP SCT (DISPOSABLE) IMPLANT
SPONGE GAUZE 4X4 12PLY (GAUZE/BANDAGES/DRESSINGS) ×2 IMPLANT
STAPLER VISISTAT 35W (STAPLE) ×1 IMPLANT
STRIP CLOSURE SKIN 1/2X4 (GAUZE/BANDAGES/DRESSINGS) ×1 IMPLANT
SUCTION FRAZIER TIP 10 FR DISP (SUCTIONS) ×2 IMPLANT
SUT VIC AB 0 CT1 27 (SUTURE) ×4
SUT VIC AB 0 CT1 27XBRD ANBCTR (SUTURE) ×2 IMPLANT
SUT VIC AB 1 CTX 36 (SUTURE) ×4
SUT VIC AB 1 CTX36XBRD ANBCTR (SUTURE) ×2 IMPLANT
SUT VIC AB 2-0 CT1 27 (SUTURE) ×2
SUT VIC AB 2-0 CT1 TAPERPNT 27 (SUTURE) IMPLANT
SUT VIC AB 2-0 FS1 27 (SUTURE) ×4 IMPLANT
SUT VIC AB 2-0 SH 27 (SUTURE)
SUT VIC AB 2-0 SH 27XBRD (SUTURE) IMPLANT
SUT VIC AB 3-0 SH 27 (SUTURE)
SUT VIC AB 3-0 SH 27X BRD (SUTURE) IMPLANT
TIBIAL BEARING INSERT SZ7 (Orthopedic Implant) ×2 IMPLANT
TOWEL OR 17X24 6PK STRL BLUE (TOWEL DISPOSABLE) ×2 IMPLANT
TOWEL OR 17X26 10 PK STRL BLUE (TOWEL DISPOSABLE) ×2 IMPLANT
TRAY FOLEY CATH 16FRSI W/METER (SET/KITS/TRAYS/PACK) IMPLANT
TUBE ANAEROBIC SPECIMEN COL (MISCELLANEOUS) ×2 IMPLANT
TUBE CONNECTING 12X1/4 (SUCTIONS) ×1 IMPLANT
WATER STERILE IRR 1000ML POUR (IV SOLUTION) ×6 IMPLANT
YANKAUER SUCT BULB TIP NO VENT (SUCTIONS) ×1 IMPLANT

## 2013-07-24 NOTE — Preoperative (Signed)
Beta Blockers   Reason not to administer Beta Blockers:Not Applicable 

## 2013-07-24 NOTE — Interval H&P Note (Signed)
History and Physical Interval Note:  07/24/2013 8:24 AM  Marc Maldonado  has presented today for surgery, with the diagnosis of DJD RT KNEE, MECHANICAL COMPLICATIONS  The various methods of treatment have been discussed with the patient and family. After consideration of risks, benefits and other options for treatment, the patient has consented to  Procedure(s): TOTAL KNEE REVISION (Right) as a surgical intervention .  The patient's history has been reviewed, patient examined, no change in status, stable for surgery.  I have reviewed the patient's chart and labs.  Questions were answered to the patient's satisfaction.     Deaunna Olarte F

## 2013-07-24 NOTE — OR Nursing (Signed)
Ate 1/3 of regular meal

## 2013-07-24 NOTE — OR Nursing (Signed)
Off monitor

## 2013-07-24 NOTE — Anesthesia Procedure Notes (Signed)
Procedure Name: Intubation Date/Time: 07/24/2013 10:17 AM Performed by: Ellin Goodie Pre-anesthesia Checklist: Patient identified, Emergency Drugs available, Suction available, Patient being monitored and Timeout performed Patient Re-evaluated:Patient Re-evaluated prior to inductionOxygen Delivery Method: Circle system utilized Preoxygenation: Pre-oxygenation with 100% oxygen Intubation Type: IV induction Ventilation: Mask ventilation without difficulty Laryngoscope Size: Mac and 4 Grade View: Grade II Tube type: Oral Tube size: 7.5 mm Number of attempts: 1 Airway Equipment and Method: Stylet Placement Confirmation: ETT inserted through vocal cords under direct vision,  positive ETCO2 and breath sounds checked- equal and bilateral Secured at: 22 cm Tube secured with: Tape Dental Injury: Teeth and Oropharynx as per pre-operative assessment  Comments: Easy atraumatic induction and intubation with MAC 4 blade.  Dr. Gelene Mink verified placement of ETT.  Carlynn Herald, CRNA

## 2013-07-24 NOTE — Anesthesia Postprocedure Evaluation (Signed)
Anesthesia Post Note  Patient: Marc Maldonado  Procedure(s) Performed: Procedure(s) (LRB): TOTAL KNEE REVISION (Right)  Anesthesia type: General  Patient location: PACU  Post pain: Pain level controlled  Post assessment: Patient's Cardiovascular Status Stable  Last Vitals:  Filed Vitals:   07/24/13 1345  BP: 110/70  Pulse:   Temp: 36.3 C  Resp:     Post vital signs: Reviewed and stable  Level of consciousness: alert  Complications: No apparent anesthesia complications

## 2013-07-24 NOTE — Anesthesia Preprocedure Evaluation (Addendum)
Anesthesia Evaluation  Patient identified by MRN, date of birth, ID band Patient awake    Reviewed: Allergy & Precautions, H&P , NPO status , Patient's Chart, lab work & pertinent test results, reviewed documented beta blocker date and time   Airway Mallampati: I TM Distance: >3 FB Neck ROM: full    Dental  (+) Teeth Intact and Dental Advisory Given   Pulmonary neg pulmonary ROS,  breath sounds clear to auscultation        Cardiovascular hypertension, Pt. on medications Rhythm:Regular     Neuro/Psych negative neurological ROS  negative psych ROS   GI/Hepatic Neg liver ROS, GERD-  Controlled,  Endo/Other  negative endocrine ROS  Renal/GU Renal disease  negative genitourinary   Musculoskeletal   Abdominal (+)  Abdomen: soft. Bowel sounds: normal.  Peds  Hematology negative hematology ROS (+)   Anesthesia Other Findings See surgeon's H&P   Reproductive/Obstetrics negative OB ROS                        Anesthesia Physical Anesthesia Plan  ASA: II  Anesthesia Plan: General   Post-op Pain Management:    Induction: Intravenous  Airway Management Planned: Oral ETT  Additional Equipment:   Intra-op Plan:   Post-operative Plan: Extubation in OR  Informed Consent: I have reviewed the patients History and Physical, chart, labs and discussed the procedure including the risks, benefits and alternatives for the proposed anesthesia with the patient or authorized representative who has indicated his/her understanding and acceptance.   Dental Advisory Given  Plan Discussed with: CRNA and Surgeon  Anesthesia Plan Comments:         Anesthesia Quick Evaluation

## 2013-07-24 NOTE — H&P (View-Only) (Signed)
TOTAL KNEE REVISION ADMISSION H&P  Patient is being admitted for right revision total knee arthroplasty.  Subjective:  Chief Complaint:right knee pain.  HPI: Marc Maldonado, 67 y.o. male, has a history of pain and functional disability in the right knee(s) due to failed previous arthroplasty and patient has failed non-surgical conservative treatments for greater than 12 weeks to include NSAID's and/or analgesics, corticosteriod injections, supervised PT with diminished ADL's post treatment, use of assistive devices and activity modification. The indications for the revision of the total knee arthroplasty are loosening of one or more components. Onset of symptoms was gradual starting 2 years ago with gradually worsening course since that time.  Prior procedures on the right knee(s) include arthroplasty.  Patient currently rates pain in the right knee(s) at 10 out of 10 with activity. There is night pain, worsening of pain with activity and weight bearing, pain that interferes with activities of daily living and joint swelling.  Patient has evidence of prosthetic loosening by imaging studies. This condition presents safety issues increasing the risk of falls.  There is no current active infection.  There are no active problems to display for this patient.  Past Medical History  Diagnosis Date  . Arthritis   . BPH (benign prostatic hyperplasia)   . DJD (degenerative joint disease)   . Wears glasses   . Hypertension     Past Surgical History  Procedure Laterality Date  . Total knee arthroplasty  2008    rt  . Total knee arthroplasty  2003    left  . Tonsillectomy    . Carpal tunnel release  2012    rt and lt  . Hernia repair  2007    umb hr  . Cystoscopy with biopsy      bladder tumor  . Colonoscopy    . Shoulder arthroscopy w/ rotator cuff repair  2010    rt     (Not in a hospital admission) No Known Allergies  History  Substance Use Topics  . Smoking status: Never Smoker   .  Smokeless tobacco: Not on file  . Alcohol Use: No     Comment: not in 85yr    No family history on file.    Review of Systems  Musculoskeletal: Positive for joint pain.  All other systems reviewed and are negative.     Objective:  Physical Exam  Constitutional: He is oriented to person, place, and time. He appears well-developed and well-nourished.  HENT:  Head: Normocephalic and atraumatic.  Eyes: Conjunctivae and EOM are normal. Pupils are equal, round, and reactive to light.  Neck: Neck supple.  Cardiovascular: Normal rate, regular rhythm and normal heart sounds.   Respiratory: Effort normal and breath sounds normal.  GI: Soft. Bowel sounds are normal.  Musculoskeletal:  Examination of the right knee reveals good range of motion.  There is some tenderness to palpation of the right knee.  Good strength bilaterally.  Collateral ligaments appear stable.  Patient is neurovascularly intact.    Neurological: He is alert and oriented to person, place, and time.  Skin: Skin is warm and dry.  Psychiatric: He has a normal mood and affect. His behavior is normal.    Vital signs in last 24 hours: Temperature: 97.7.  Blood pressure: 143/84.  Pulse: 73.  O2 SAT: 97% on room air.   Labs:  Estimated body mass index is 35.63 kg/(m^2) as calculated from the following:   Height as of 06/07/12: 6\' 1"  (1.854 m).  Weight as of 06/07/12: 122.471 kg (270 lb).  Imaging Review Plain radiographs demonstrate severe degenerative joint disease of the right knee(s). The overall alignment is mild varus.There is evidence of loosening of the tibial components. The bone quality appears to be good for age and reported activity level.   Assessment/Plan:  End stage arthritis, right knee(s) with failed previous arthroplasty.   The patient history, physical examination, clinical judgment of the provider and imaging studies are consistent with end stage degenerative joint disease of the right knee(s),  previous total knee arthroplasty. Revision total knee arthroplasty is deemed medically necessary. The treatment options including medical management, injection therapy, arthroscopy and revision arthroplasty were discussed at length. The risks and benefits of revision total knee arthroplasty were presented and reviewed. The risks due to aseptic loosening, infection, stiffness, patella tracking problems, thromboembolic complications and other imponderables were discussed. The patient acknowledged the explanation, agreed to proceed with the plan and consent was signed. Patient is being admitted for inpatient treatment for surgery, pain control, PT, OT, prophylactic antibiotics, VTE prophylaxis, progressive ambulation and ADL's and discharge planning.The patient is planning to be discharged home with home health services

## 2013-07-24 NOTE — Progress Notes (Signed)
Orthopedic Tech Progress Note Patient Details:  Marc Maldonado Oct 06, 1946 [id number]  CPM Right Knee CPM Right Knee: On Right Knee Flexion (Degrees): 60 Right Knee Extension (Degrees): 0 Additional Comments: Trapeze bar and foot roll   Cammer, Mickie Bail 07/24/2013, 2:10 PM

## 2013-07-24 NOTE — Transfer of Care (Signed)
Immediate Anesthesia Transfer of Care Note  Patient: Marc Maldonado  Procedure(s) Performed: Procedure(s): TOTAL KNEE REVISION (Right)  Patient Location: PACU  Anesthesia Type:General  Level of Consciousness: awake, alert  and oriented  Airway & Oxygen Therapy: Patient connected to face mask  Post-op Assessment: Report given to PACU RN  Post vital signs: stable  Complications: No apparent anesthesia complications

## 2013-07-24 NOTE — OR Nursing (Signed)
Moved to room  16 off monitor to allow pt to watch tv , eat and relax

## 2013-07-25 LAB — BASIC METABOLIC PANEL
BUN: 17 mg/dL (ref 6–23)
Creatinine, Ser: 1.19 mg/dL (ref 0.50–1.35)
GFR calc Af Amer: 72 mL/min — ABNORMAL LOW (ref >90)
GFR calc non Af Amer: 62 mL/min — ABNORMAL LOW (ref >90)
Potassium: 4.8 mEq/L (ref 3.5–5.1)
Sodium: 139 mEq/L (ref 135–145)

## 2013-07-25 LAB — CBC
Hemoglobin: 12.4 g/dL — ABNORMAL LOW (ref 13.0–17.0)
MCHC: 35.3 g/dL (ref 30.0–36.0)
Platelets: 125 10*3/uL — ABNORMAL LOW (ref 150–400)
RDW: 12.8 % (ref 11.5–15.5)

## 2013-07-25 MED ORDER — METHOCARBAMOL 500 MG PO TABS: 500.0000 mg | ORAL_TABLET | Freq: Four times a day (QID) | ORAL | Status: DC

## 2013-07-25 MED ORDER — ASPIRIN 325 MG PO TABS: 325.0000 mg | ORAL_TABLET | Freq: Every day | ORAL | Status: DC

## 2013-07-25 MED ORDER — OXYCODONE-ACETAMINOPHEN 10-325 MG PO TABS: 1.0000 | ORAL_TABLET | ORAL | Status: DC | PRN

## 2013-07-25 NOTE — Evaluation (Signed)
Physical Therapy Evaluation Patient Details Name: Marc Maldonado MRN: [id number] DOB: 1946-08-15 Today's Date: 07/25/2013 Time: 7829-5621 PT Time Calculation (min): 48 min  PT Assessment / Plan / Recommendation History of Present Illness  Pt adm for Rt total knee revision  Clinical Impression  Pt is s/p TKrevision resulting in the deficits listed below (see PT Problem List).  Pt will benefit from skilled PT to increase their independence and safety with mobility to allow discharge to the venue listed below. (pt doing extremely well, will not need HHPT). To begin OPPT when MD okays     PT Assessment  Patient needs continued PT services    Follow Up Recommendations  Outpatient PT;Supervision for mobility/OOB (OPPT when MD okays)    Does the patient have the potential to tolerate intense rehabilitation      Barriers to Discharge        Equipment Recommendations  None recommended by PT    Recommendations for Other Services     Frequency 7X/week    Precautions / Restrictions Precautions Precautions: Knee Precaution Comments: educated on no pillow under knee and no twisting on RLE Required Braces or Orthoses: Knee Immobilizer - Right Knee Immobilizer - Right: Discontinue post op day 2 Restrictions RLE Weight Bearing: Weight bearing as tolerated   Pertinent Vitals/Pain 2/10 knee discomfort; patient repositioned for comfort      Mobility  Bed Mobility Bed Mobility: Supine to Sit;Sitting - Scoot to Edge of Bed Supine to Sit: 5: Supervision;HOB flat Sitting - Scoot to Edge of Bed: 7: Independent Transfers Transfers: Sit to Stand;Stand to Sit Sit to Stand: 4: Min guard;With upper extremity assist Stand to Sit: 4: Min guard;With upper extremity assist Details for Transfer Assistance: vc for safe use of RW/safe hand placement Ambulation/Gait Ambulation/Gait Assistance: 4: Min guard;5: Supervision Ambulation Distance (Feet): 300 Feet Assistive device: Rolling  walker Ambulation/Gait Assistance Details: pt not bearing weight through hands on RW, attempted crutches (pt preference), and ultimately pt wanted to walk with no device; very minimal antalgic gait Gait Pattern: Step-through pattern;Antalgic Gait velocity: slightly decr    Exercises Total Joint Exercises Ankle Circles/Pumps: AROM;Right;10 reps;Supine Quad Sets: AROM;Right;5 reps;Supine Short Arc Quad: AROM;Right;10 reps;Supine Heel Slides: AROM;Right;10 reps;Supine Hip ABduction/ADduction: AROM;Right;10 reps;Supine Straight Leg Raises: AROM;Right;10 reps;Supine Knee Flexion: AROM;Right;10 reps;Seated Goniometric ROM: 0-110   PT Diagnosis: Difficulty walking;Acute pain  PT Problem List: Decreased strength;Decreased range of motion;Decreased activity tolerance;Decreased mobility;Decreased knowledge of use of DME;Decreased knowledge of precautions;Pain PT Treatment Interventions: DME instruction;Gait training;Stair training;Functional mobility training;Therapeutic exercise;Patient/family education     PT Goals(Current goals can be found in the care plan section) Acute Rehab PT Goals Patient Stated Goal: go home today PT Goal Formulation: With patient Time For Goal Achievement: 07/26/13 Potential to Achieve Goals: Good  Visit Information  Last PT Received On: 07/25/13 Assistance Needed: +1 History of Present Illness: Pt adm for Rt total knee revision       Prior Functioning  Home Living Family/patient expects to be discharged to:: Private residence Living Arrangements: Spouse/significant other Available Help at Discharge: Family;Available 24 hours/day Type of Home: House Home Access: Stairs to enter [employer] of Steps: 14 (garage up to first floor) Entrance Stairs-Rails: Right Home Layout: Multi-level;Bed/bath upstairs Alternate Level Stairs-Number of Steps: 14 Alternate Level Stairs-Rails: Right Home Equipment: Crutches Prior Function Level of Independence:  Independent Communication Communication: No difficulties    Cognition  Cognition Arousal/Alertness: Awake/alert Behavior During Therapy: WFL for tasks assessed/performed Overall Cognitive Status: Within Functional Limits for tasks  assessed    Extremity/Trunk Assessment Upper Extremity Assessment Upper Extremity Assessment: Overall WFL for tasks assessed Lower Extremity Assessment Lower Extremity Assessment: RLE deficits/detail RLE Deficits / Details: hip/knee extension at least 3/5 (did 10 SLR without lag); knee AROM 0-110   Balance    End of Session PT - End of Session Equipment Utilized During Treatment: Gait belt Activity Tolerance: Patient tolerated treatment well Patient left: in chair;with call bell/phone within reach Nurse Communication: Mobility status CPM Right Knee CPM Right Knee: Off  GP     [name] 07/25/2013, 12:22 PM Pager [phone]

## 2013-07-25 NOTE — Progress Notes (Signed)
UR COMPLETED  

## 2013-07-25 NOTE — Op Note (Signed)
NAMEEMILO, GRAS NO.:  [id number]  MEDICAL RECORD NO.:  [id number]  LOCATION:  [id number]                        FACILITY:  [facility]  PHYSICIAN:  Loreta Ave, M.D. 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  Traumatic injury with loosening and possible breakage of patellar component.  Some mild-to-moderate wear of the tibial polyethylene component.  No gross loosening.  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  Traumatic injury with loosening and possible breakage of patellar component.  Some mild-to-moderate wear of the tibial polyethylene component.  No gross loosening with a very loose patellar component with one of the polyethylene PEGs fractured off.  Mild-to- moderate wear of the tibial component.  Intra-articular adhesions scar tissue.  PROCEDURE:  Revision right total knee replacement.  Removal and revision of patellar component to a PEG cemented 40 mm component.  Revision of the polyethylene portion of tibial component with replacement of the insert with a new 9 mm polyethylene component.  The components were well fixed.  SURGEON:  Loreta Ave, M.D.  ASSISTANT:  Domingo Cocking, PA, present throughout the entire case, necessary for timely completion of procedure.  ANESTHESIA:  General.  ESTIMATED BLOOD LOSS:  Minimal.  SPECIMENS:  None.  CULTURES:  None.  COMPLICATION:  None.  DRESSINGS:  Soft compressive knee immobilizer.  DRAINS:  Hemovac x1.  TOURNIQUET TIME:  One hour.  PROCEDURE IN DETAIL:  The patient was brought to operating room, placed on the operating table in supine position.  After adequate anesthesia had been obtained, knee examined.  Still fairly good motion and stability.  Very abnormal feeling of the  patella with what felt like loosening the patellar component.  Tourniquet applied.  Prepped and draped in usual sterile fashion.  Exsanguinated with elevation of Esmarch.  Tourniquet inflated to 350 mmHg.  Previous incision was opened.  Skin and subcutaneous tissue was divided.  Hemostasis with cautery.  Medial arthrotomy, vastus splitting, preserving quad tendon. A lot of intra-articular lesions and scar tissue debrided throughout. Patella exposed.  Patellar component almost fell off after I cleared up soft tissue around this.  The lateral PEG had broken off and the component was loose creating a __________ affect.  That was removed. Patella exposed.  Adhesions were removed.  I removed about a 1 mm of bone and remaining cement.  All abnormal fibrous tissue was curetted out.  This was size drilled and fitted for a 40 mm component.  I removed the polyethylene from the tibia.  Mild-to-moderate, were nothing extreme.  After appropriate trials, a new 9 mm insert was inserted.  The knee reduced.  Patella was then copiously irrigated.  A new 40 mm component cemented.  Once cement hardened, the knee was examined.  Good stability, good tracking confirmed.  Soft tissues injected with Exparel. Hemovac was placed.  Arthrotomy closed with #1 Vicryl.  Skin and subcutaneous tissue with Vicryl and staples.  Sterile compressive dressing applied.  Tourniquet deflated and removed.  Knee immobilizer applied.  Anesthesia reversed.  Brought to the recovery room.  Tolerated the surgery well with no complications.     Loreta Ave, M.D.     DFM/MEDQ  D:  07/24/2013  T:  07/25/2013  Job:  [id number]

## 2013-07-25 NOTE — Progress Notes (Signed)
Patient ID: Marc Maldonado, male   DOB: 19-Oct-1946, 67 y.o.   MRN: [id number]  PROGRESS NOTE  Subjective:  negative for Chest Pain  negative for Shortness of Breath  negative for Nausea/Vomiting   negative for Calf Pain  negative for Bowel Movement   Tolerating Diet: yes         Patient reports pain as 0 on 0-10 scale.     Pt denies any knee pain. C/O some issues with constipation. No other complaints.   Objective: Vital signs in last 24 hours:   Patient Vitals for the past 24 hrs:  BP Temp Temp src Pulse Resp SpO2  07/25/13 0651 130/60 mmHg 98 F (36.7 C) Oral 70 18 97 %  07/25/13 0400 - - - - 18 98 %  07/25/13 0257 122/59 mmHg 98.3 F (36.8 C) Oral 64 16 95 %  07/25/13 0000 - - - - 18 96 %  07/24/13 2152 142/92 mmHg 98.6 F (37 C) Oral 77 16 96 %  07/24/13 2000 - - - - 16 97 %  07/24/13 1520 - - - - - 95 %  07/24/13 1345 110/70 mmHg 97.3 F (36.3 C) - - - 100 %  07/24/13 1320 119/76 mmHg 97.6 F (36.4 C) - 59 16 97 %  07/24/13 1300 109/70 mmHg - - 57 14 100 %  07/24/13 1245 121/73 mmHg - - 103 15 100 %  07/24/13 1230 104/62 mmHg 97.5 F (36.4 C) - 107 14 100 %  07/24/13 0720 153/96 mmHg 97.3 F (36.3 C) Oral 66 20 99 %      Intake/Output from previous day:   10/01 0701 - 10/02 0700 In: 3260 [P.O.:360; I.V.:2700] Out: 575 [Urine:100; Drains:425]   Intake/Output this shift:   10/01 1901 - 10/02 0700 In: 1560 [P.O.:360; I.V.:1200] Out: 250 [Urine:100; Drains:150]   Intake/Output     10/01 0701 - 10/02 0700   P.O. 360   I.V. 2700   Other 150   IV Piggyback 50   Total Intake 3260   Urine 100   Drains 425   Blood 50   Total Output 575   Net +2685       Urine Occurrence 3 x      LABORATORY DATA:  Recent Labs  07/25/13 0340  WBC 12.6*  HGB 12.4*  HCT 35.1*  PLT 125*    Recent Labs  07/25/13 0340  NA 139  K 4.8  CL 104  CO2 24  BUN 17  CREATININE 1.19  GLUCOSE 135*  CALCIUM 8.5   Lab Results  Component Value Date   INR 1.28 07/16/2013     Examination:  General appearance: alert, cooperative and no distress  Wound Exam: dressing in place  Drainage:  No drainage noted through dressing, hemovac removed  Motor Exam: grossly intact bilateral LE  Sensory Exam: grossly intact bilateral LE  Vascular Exam: Normal  Assessment:    1 Day Post-Op  Procedure(s) (LRB): TOTAL KNEE REVISION (Right)  ADDITIONAL DIAGNOSIS:  Active Problems:   * No active hospital problems. *    Plan: Physical Therapy as ordered Weight Bearing as Tolerated (WBAT)  DVT Prophylaxis:  Aspirin  DISCHARGE PLAN: Home  DISCHARGE NEEDS: HHPT and CPM, DME rec per PT/OT         Eduin Friedel JAMES 07/25/2013, 6:57 AM

## 2013-07-25 NOTE — Discharge Summary (Signed)
PATIENT ID: Marc Maldonado        MRN:  [id number]          DOB/AGE: 1946-08-21 / 67 y.o.    DISCHARGE SUMMARY  ADMISSION DATE:    07/24/2013 DISCHARGE DATE:   07/25/2013   ADMISSION DIAGNOSIS: DJD RT KNEE, MECHANICAL COMPLICATIONS    DISCHARGE DIAGNOSIS:  DJD RT KNEE, MECHANICAL COMPLICATIONS    ADDITIONAL DIAGNOSIS: Active Problems:   * No active hospital problems. *  Past Medical History  Diagnosis Date  . BPH (benign prostatic hyperplasia)   . Wears glasses   . Hypertension   . GERD (gastroesophageal reflux disease)     occas. indigestion   . Chronic kidney disease     renal stone-(2) tumors removed fr. bladder  . 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 Arthritis     low back & neck,- DDD  . DJD (degenerative joint disease)     PROCEDURE: Procedure(s): TOTAL KNEE REVISION Right on 07/24/2013  CONSULTS: PT/OT, Care management     HISTORY:  See H&P in chart  HOSPITAL COURSE:  Marc Maldonado is a 67 y.o. admitted on 07/24/2013 and found to have a diagnosis of DJD RT KNEE, MECHANICAL COMPLICATIONS.  After appropriate laboratory studies were obtained  they were taken to the operating room on 07/24/2013 and underwent  Procedure(s): TOTAL KNEE REVISION  Right.   They were given perioperative antibiotics:  Anti-infectives   Start     Dose/Rate Route Frequency Ordered Stop   07/24/13 1730  ceFAZolin (ANCEF) IVPB 2 g/50 mL premix     2 g 100 mL/hr over 30 Minutes Intravenous Every 6 hours 07/24/13 1620 07/25/13 0042   07/24/13 0600  ceFAZolin (ANCEF) IVPB 2 g/50 mL premix     2 g 100 mL/hr over 30 Minutes Intravenous 60 min pre-op 07/23/13 1428 07/24/13 1016    .  Tolerated the procedure well.  Placed with a foley intraoperatively.      POD #1, allowed out of bed to a chair.  PT for ambulation and exercise program.  Foley D/C'd in morning.  IV saline locked.  O2 discontionued. Hemovac pulled. .  The remainder of the  hospital course was dedicated to ambulation and strengthening.   The patient was discharged on 1 Day Post-Op in  Stable condition.  Blood products given:none  DIAGNOSTIC STUDIES: Recent vital signs: Patient Vitals for the past 24 hrs:  BP Temp Temp src Pulse Resp SpO2  07/25/13 1245 135/65 mmHg 98.5 F (36.9 C) - 63 18 99 %  07/25/13 0927 130/60 mmHg - - - - -  07/25/13 0751 130/60 mmHg - - - - -  07/25/13 0651 130/60 mmHg 98 F (36.7 C) Oral 70 18 97 %  07/25/13 0400 - - - - 18 98 %  07/25/13 0257 122/59 mmHg 98.3 F (36.8 C) Oral 64 16 95 %  07/25/13 0000 - - - - 18 96 %  07/24/13 2152 142/92 mmHg 98.6 F (37 C) Oral 77 16 96 %  07/24/13 2000 - - - - 16 97 %  07/24/13 1520 - - - - - 95 %       Recent laboratory studies:  Recent Labs  07/25/13 0340  WBC 12.6*  HGB 12.4*  HCT 35.1*  PLT 125*    Recent Labs  07/25/13 0340  NA 139  K 4.8  CL 104  CO2 24  BUN 17  CREATININE 1.19  GLUCOSE 135*  CALCIUM 8.5   Lab Results  Component Value Date   INR 1.28 07/16/2013     Recent Radiographic Studies :  Dg Chest 2 View  07/16/2013   CLINICAL DATA:  Right knee pain  EXAM: CHEST  2 VIEW  COMPARISON:  10/23/2006  FINDINGS: Mild hyperinflation. Negative for heart failure or pneumonia. Lungs are clear without infiltrate or effusion. Calcified granulomata left lower lobe. Calcified left hilar lymph nodes.  IMPRESSION: COPD. No acute abnormality apparent   Electronically Signed   By: Marlan Palau M.D.   On: 07/16/2013 16:35   Dg Knee 1-2 Views Right  07/24/2013   CLINICAL DATA:  Postop knee arthroplasty  EXAM: RIGHT KNEE - 1-2 VIEW  COMPARISON:  10/30/2006  FINDINGS: Total knee replacement in satisfactory position and alignment. Femoral and tibial prostheses appear unchanged. There is a new patellar prosthesis.  IMPRESSION: Total knee replacement without acute abnormality.   Electronically Signed   By: Marlan Palau M.D.   On: 07/24/2013 16:43    DISCHARGE  INSTRUCTIONS: Discharge Orders   Future Orders Complete By Expires   Call MD / Call 911  As directed    Comments:     If you experience chest pain or shortness of breath, CALL 911 and be transported to the hospital emergency room.  If you develope a fever above 101 F, pus (white drainage) or increased drainage or redness at the wound, or calf pain, call your surgeon's office.   Call MD / Call 911  As directed    Comments:     If you experience chest pain or shortness of breath, CALL 911 and be transported to the hospital emergency room.  If you develope a fever above 101 F, pus (white drainage) or increased drainage or redness at the wound, or calf pain, call your surgeon's office.   Change dressing  As directed    Comments:     change the dressing daily with sterile 4 x 4 inch gauze dressing and apply TED hose.  You may clean the incision with alcohol prior to redressing.   Change dressing  As directed    Comments:     change the dressing daily with sterile 4 x 4 inch gauze dressing and apply TED hose.  You may clean the incision with alcohol prior to redressing.   Constipation Prevention  As directed    Comments:     Drink plenty of fluids.  Prune juice may be helpful.  You may use a stool softener, such as Colace (over the counter) 100 mg twice a day.  Use MiraLax (over the counter) for constipation as needed.   Constipation Prevention  As directed    Comments:     Drink plenty of fluids.  Prune juice may be helpful.  You may use a stool softener, such as Colace (over the counter) 100 mg twice a day.  Use MiraLax (over the counter) for constipation as needed.   CPM  As directed    Comments:     Continuous passive motion machine (CPM):      Use the CPM from 0 to 90 for 6 hours per day.           Use CPM for 2 weeks or until you are told to stop.   CPM  As directed    Comments:     Continuous passive motion machine (CPM):      Use the CPM from  0 to 90 for 6 hours per day.           Use  CPM for 2 weeks or until you are told to stop.   Diet - low sodium heart healthy  As directed    Diet - low sodium heart healthy  As directed    Discharge instructions  As directed    Comments:     Diet: You may resume  the diet you were on prior to surgery  Activity: Continue physical therapy protocol. Increase activity slowly as tolerated. No  lifting until further notice.   Driving: No driving until further notice.  CPM: Continuous passive motion machine (CPM):       Use the CPM from 0 to 70 degrees for 6-8 hours per day.       You may increase by 10 degrees per day as tolerated.  You may break it up into  2 or 3 sessions per day.       Use CPM for 3-4 weeks or until you are told to stop.  TED hose: Use stockings (TED hose) for 3-4 weeks on both leg(s).  You may remove  them at night for sleeping.  Shower:  May shower without a dressing once there is no drainage from your wound.                 Do NOT wash over the wound.  If drainage remains, cover wound with saran                  Wrap and then shower.  Clean incision with betadine and change dressing                        After saran wrap removed.  Do not apply any creams or ointments to incision.   Dressing:  You may change your dressing on Monday                    Then change the dressing daily with sterile 4"x4"s gauze dressing                     And TED hose for knees.  Use paper tape to hold dressing in place                     For hips.  You may clean the incision with alcohol prior to redressing.  Weight Bearing:   weight bearing as taught in physical therapy.  Use a walker or                    Crutches as instructed.  To prevent constipation: you may use a stool softener such as -               Colace ( over the counter) 100 mg by mouth twice a day                Drink plenty of fluids ( prune juice may be helpful) and high fiber foods                Miralax ( over the counter) for constipation as needed.     Medications: Scripts for percocet, Aspirin, and robaxin provided or called in to  Pharmacy, will need to remain on ASA for 4 weeks post-op  Precautions:  SEEK MEDICAL CARE IF:  You have swelling of your calf or leg.  You develop shortness of breath or chest pain.   You have redness, swelling, or increasing pain in the wound.   There is pus or any unusual drainage coming from the surgical site.   You notice a bad smell coming from the surgical site or dressing.   The surgical site breaks open after sutures or staples have been removed.   There is persistent bleeding from the suture or staple line.   You are getting worse or are not improving.   You have any other questions or concerns.  SEEK IMMEDIATE MEDICAL CARE IF:   You develop chest pain  You have a fever.   You develop a rash.   You have difficulty breathing.   You develop any reaction or side effects to medicines given.   Your knee motion is decreasing rather than improving.  MAKE SURE YOU:   Understand these instructions.   Will watch your condition.   Will get help right away if you are not doing well or get worse.   Discharge instructions  As directed    Comments:     Diet: You may resume  the diet you were on prior to surgery  Activity: Continue physical therapy protocol. Increase activity slowly as tolerated. No  lifting until further notice.   Driving: No driving until further notice.  CPM: Continuous passive motion machine (CPM):       Use the CPM from 0 to 70 degrees for 6-8 hours per day.       You may increase by 10 degrees per day as tolerated.  You may break it up into  2 or 3 sessions per day.       Use CPM for 3-4 weeks or until you are told to stop.  TED hose: Use stockings (TED hose) for 3-4 weeks on both leg(s).  You may remove  them at night for sleeping.  Shower:  May shower without a dressing once there is no drainage from your wound.                 Do NOT wash over the wound.  If drainage  remains, cover wound with saran                  Wrap and then shower.  Clean incision with betadine and change dressing                        After saran wrap removed.  Do not apply any creams or ointments to incision.   Dressing:  You may change your dressing on Saturday                    Then change the dressing daily with sterile 4"x4"s gauze dressing                     And TED hose for knees.  Use paper tape to hold dressing in place                     For hips.  You may clean the incision with alcohol prior to redressing.  Weight Bearing:   weight bearing as taught in physical therapy.  Use a walker or                    Crutches as instructed.  To prevent constipation: you may use a stool softener such as -  Colace ( over the counter) 100 mg by mouth twice a day                Drink plenty of fluids ( prune juice may be helpful) and high fiber foods                Miralax ( over the counter) for constipation as needed.    Medications: Scripts for percocet, Aspirin and robaxin, provided or called in to  Pharmacy. Will need to remain of ASA for 4 weeks post-op.  Precautions:  SEEK MEDICAL CARE IF:  You have swelling of your calf or leg.   You develop shortness of breath or chest pain.   You have redness, swelling, or increasing pain in the wound.   There is pus or any unusual drainage coming from the surgical site.   You notice a bad smell coming from the surgical site or dressing.   The surgical site breaks open after sutures or staples have been removed.   There is persistent bleeding from the suture or staple line.   You are getting worse or are not improving.   You have any other questions or concerns.  SEEK IMMEDIATE MEDICAL CARE IF:   You develop chest pain  You have a fever.   You develop a rash.   You have difficulty breathing.   You develop any reaction or side effects to medicines given.   Your knee motion is decreasing rather than improving.  MAKE  SURE YOU:   Understand these instructions.   Will watch your condition.   Will get help right away if you are not doing well or get worse.   Do not put a pillow under the knee. Place it under the heel.  As directed    Do not put a pillow under the knee. Place it under the heel.  As directed    Increase activity slowly as tolerated  As directed    Increase activity slowly as tolerated  As directed    Patient may shower  As directed    Comments:     You may shower without a dressing once there is no drainage x 2 days. If drainage remains, cover wound with plastic wrap and then shower.   Patient may shower  As directed    Comments:     You may shower without a dressing once there is no drainage x 2 days. If drainage remains, cover wound with plastic wrap and then shower.   TED hose  As directed    Comments:     Use stockings (TED hose) for 2 weeks on both leg(s).  You may remove them at night for sleeping.   TED hose  As directed    Comments:     Use stockings (TED hose) for 2 weeks on both leg(s).  You may remove them at night for sleeping.   Weight bearing as tolerated  As directed    Weight bearing as tolerated  As directed       DISCHARGE MEDICATIONS:     Medication List         ALEVE 220 MG Caps  Generic drug:  Naproxen Sodium  Take 440 mg by mouth daily as needed (pain).     aspirin 325 MG tablet  Take 1 tablet (325 mg total) by mouth daily.     b complex vitamins tablet  Take 1 tablet by mouth daily.     Calcium-Magnesium-Vitamin D 500-250-125 MG-MG-UNIT Tabs  Take 1 tablet by mouth daily.     cholecalciferol 1000 UNITS tablet  Commonly known as:  VITAMIN D  Take 1,000 Units by mouth daily.     co-enzyme Q-10 30 MG capsule  Take 30 mg by mouth daily.     diphenhydramine-acetaminophen 25-500 MG Tabs  Commonly known as:  TYLENOL PM  Take 2 tablets by mouth at bedtime.     famotidine-calcium carbonate-magnesium hydroxide 10-800-165 MG Chew chewable tablet   Commonly known as:  PEPCID COMPLETE  Chew 1 tablet by mouth daily as needed.     Garlic 600 MG Caps  Take 600 mg by mouth daily.     Glucosamine Sulfate 500 MG Caps  Take 500 mg by mouth daily.     lisinopril 20 MG tablet  Commonly known as:  PRINIVIL,ZESTRIL  Take 20 mg by mouth daily.     methocarbamol 500 MG tablet  Commonly known as:  ROBAXIN  Take 1 tablet (500 mg total) by mouth 4 (four) times daily.     multivitamin with minerals tablet  Take 1 tablet by mouth daily.     OVER THE COUNTER MEDICATION  Lecithin 20 mg with 30 i.u. Vitamin E     OVER THE COUNTER MEDICATION  Take 1 tablet by mouth daily. Glucose health     oxyCODONE-acetaminophen 10-325 MG per tablet  Commonly known as:  PERCOCET  Take 1-2 tablets by mouth every 4 (four) hours as needed for pain.     sildenafil 100 MG tablet  Commonly known as:  VIAGRA  Take 100 mg by mouth daily as needed for erectile dysfunction.     tadalafil 5 MG tablet  Commonly known as:  CIALIS  Take 2.5 mg by mouth daily after supper.     tamsulosin 0.4 MG Caps capsule  Commonly known as:  FLOMAX  Take 0.4 mg by mouth daily before breakfast.     verapamil 240 MG CR tablet  Commonly known as:  CALAN-SR  Take 240 mg by mouth daily before breakfast.     vitamin C 250 MG tablet  Commonly known as:  ASCORBIC ACID  Take 250 mg by mouth daily.     zolpidem 5 MG tablet  Commonly known as:  AMBIEN  Take 5 mg by mouth at bedtime.        FOLLOW UP VISIT:       Follow-up Information   Follow up with Loreta Ave, MD. (2 weeks as scheduled)    Specialty:  Orthopedic Surgery   Contact information:   [address] [place] [address] [phone]       DISPOSITION:   Home  CONDITION:  Stable   Wilkie Aye 07/25/2013, 2:52 PM

## 2013-07-25 NOTE — Progress Notes (Signed)
Orthopedic Tech Progress Note Patient Details:  Marc Maldonado [birth year] [id number] Patient about to be discharged. CPM not applied.  Patient ID: Marc Maldonado, male   DOB: 30-Jun-1946, 67 y.o.   MRN: [id number]   [name] 07/25/2013, 3:16 PM

## 2013-07-25 NOTE — Progress Notes (Signed)
07/25/13 Patient set up with HHPT with [facility] by MD offfice. [employer] providing CPM, no other equipment needs identified. Jacquelynn Cree RN, BSN, CCM

## 2013-07-25 NOTE — Progress Notes (Signed)
OT Cancellation Note and Discharge  Patient Details Name: Marc Maldonado MRN: [id number] DOB: Dec 14, 1945   Cancelled Treatment:    Reason Eval/Treat Not Completed: OT screened, no needs identified, will sign off  Evette Georges [phone] 07/25/2013, 2:46 PM

## 2013-07-26 ENCOUNTER — Encounter (HOSPITAL_COMMUNITY): Payer: Self-pay | Admitting: Orthopedic Surgery

## 2013-09-18 ENCOUNTER — Other Ambulatory Visit: Payer: Self-pay | Admitting: Physician Assistant

## 2013-09-23 ENCOUNTER — Encounter (HOSPITAL_COMMUNITY): Payer: Self-pay | Admitting: Pharmacy Technician

## 2013-09-23 ENCOUNTER — Other Ambulatory Visit: Payer: Self-pay | Admitting: Physician Assistant

## 2013-09-23 DIAGNOSIS — T84093A Other mechanical complication of internal left knee prosthesis, initial encounter: Secondary | ICD-10-CM | POA: Insufficient documentation

## 2013-09-23 DIAGNOSIS — T84093S Other mechanical complication of internal left knee prosthesis, sequela: Secondary | ICD-10-CM

## 2013-09-23 DIAGNOSIS — I10 Essential (primary) hypertension: Secondary | ICD-10-CM | POA: Insufficient documentation

## 2013-09-23 DIAGNOSIS — K219 Gastro-esophageal reflux disease without esophagitis: Secondary | ICD-10-CM | POA: Insufficient documentation

## 2013-09-23 NOTE — H&P (Signed)
[facility] [address]   [address] [name], [name] [address] [phone] A Division of [facility]  Loreta Ave, M.D.   Robert A. Thurston Hole, M.D.   Burnell Blanks, M.D.   Eulas Post, M.D.   Lunette Stands, M.D Jewel Baize. Eulah Pont, M.D.  Buford Dresser, M.D.  Estell Harpin, M.D.    Melina Fiddler, M.D. Mary L. Isidoro Donning, PA-C  Kirstin A. 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  Chronic pain.  Returns for follow up.  Symptoms are unchanged from previous visits and he wishes to proceed with revision of left total knee arthroplasty as scheduled.  He has significant pain with activities that is affecting his quality of life and ability to perform ADLs. He has failed to respond to conservative treatment options.   Current medications: Verapamil 240 mg, Lisinopril 20 mg, Tamsulosin 0.4 mg, Tadalafil 2.5 mg and Zolpidem 5 mg at bedtime.  He is also taking Vitamin C 250 mg, natural B 12 mg, Calcium, magnesium D 250 mg, Vitamin E 290 mg, Vitamin E 30 I.U., CoQ10 30 mg, glucose health garlic 600 mg, D3 and Glucosamine Sulfate.   Allergies: No known drug allergies.  Past medical history: Positive for tinnitus, high blood pressure, cancerous colon polyps, kidney stones, previous bladder infection, blood in urine and nightly urination.   Past surgical history: Significant for left total knee replacement in 2003, right total knee replacement in 2008, right shoulder surgery in 2010, right wrist surgery in 2013, left wrist surgery in 2013, left shoulder surgery in 2013 and right knee surgery in 2014. Review of systems: Patient currently denies lightheadedness, dizziness, fevers, chills,  chest pain, pressure, palpitations, shortness of breath, or any other pulmonary issues, GI, GU or neuro issues.  Family history: Significant for heart attack in his father and high blood pressure in his mother and his father.         Social history: Does not smoke or drink.  He is married and lives with his wife.      EXAMINATION: Height: 5?10.  Weight: 243 pounds.  Temperature: 99.0.  Blood pressure: 177/96.  Pulse: 64. O2 SAT: 98% on room air.  Alert and oriented x 3 and in no acute distress.  Does not have an antalgic at this time.  Head is normocephalic, a traumatic.  PERRLA, EOMI.  Neck: Unremarkable.  Lungs: CTA bilaterally.  No wheezes, rales or rhonchi.  Heart: RRR.  No murmurs appreciated.  Abdomen: Soft and non-tender.  NBS x 4.  Calves: Soft and non-tender bilaterally.  Neuro: Grossly intact to bilateral upper and lower extremities.  Skin: Warm and dry.  Examination of his left knee reveals good range of motion from about 0 degrees extension to about 120 degrees of extension.  He does have some varus alignment, more so in the left knee than the right.  He does have some tenderness to palpation in the medial joint line. He is neurovascularly intact distally.     X-RAYS: Previous imaging of his left knee reveals loosening of  the tibial component of his left total knee replacement.    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   Continued  [facility] [address]   [address] [name], [name] [address] [phone] A Division of [facility]  Loreta Ave, M.D.   Robert A. Thurston Hole, M.D.   Burnell Blanks, M.D.   Eulas Post, M.D.   Lunette Stands, M.D Jewel Baize. Eulah Pont, M.D.  Buford Dresser, M.D.  Estell Harpin, M.D.    Melina Fiddler, M.D. Mary L. Isidoro Donning, PA-C  Kirstin A. Shepperson, PA-C  Josh  [place]ath, PA-C [name], [name]   [name], Marc Maldonado                                [phone]      DOB: 03-31-1946 PROGRESS NOTE: 09-17-13 PLAN: We will proceed with left knee revision surgery as scheduled.  Discussed the risks, benefits and possible complications of surgery.  Rehab and recovery time was discussed.  All questions were answered.  Patient will need home health at the time of discharge.  We will use Aspirin 325 mg for DVT prophylaxis.   At his last surgery he did require some Benadryl due to a possible reaction of pain medications given at that time.    Loreta Ave, M.D.   Electronically verified by Loreta Ave, M.D. DFM(MLA):jjh D 09-17-13 T 09-23-13

## 2013-09-24 ENCOUNTER — Encounter (HOSPITAL_COMMUNITY)
Admission: RE | Admit: 2013-09-24 | Discharge: 2013-09-24 | Disposition: A | Discharge status: Home / Self Care | Payer: Medicare Other | Source: Ambulatory Visit | Attending: Orthopedic Surgery | Admitting: Orthopedic Surgery

## 2013-09-24 ENCOUNTER — Encounter (HOSPITAL_COMMUNITY): Payer: Self-pay

## 2013-09-24 DIAGNOSIS — Z01818 Encounter for other preprocedural examination: Secondary | ICD-10-CM | POA: Insufficient documentation

## 2013-09-24 DIAGNOSIS — Z01812 Encounter for preprocedural laboratory examination: Secondary | ICD-10-CM | POA: Insufficient documentation

## 2013-09-24 LAB — CBC WITH DIFFERENTIAL/PLATELET
Basophils Absolute: 0 10*3/uL (ref 0.0–0.1)
Basophils Relative: 1 % (ref 0–1)
HCT: 40.9 % (ref 39.0–52.0)
Hemoglobin: 14.1 g/dL (ref 13.0–17.0)
Lymphocytes Relative: 22 % (ref 12–46)
MCH: 33.6 pg (ref 26.0–34.0)
Monocytes Absolute: 0.3 10*3/uL (ref 0.1–1.0)
Monocytes Relative: 7 % (ref 3–12)
Neutro Abs: 3.2 10*3/uL (ref 1.7–7.7)
Neutrophils Relative %: 69 % (ref 43–77)
Platelets: 146 10*3/uL — ABNORMAL LOW (ref 150–400)
RBC: 4.2 MIL/uL — ABNORMAL LOW (ref 4.22–5.81)
RDW: 12.6 % (ref 11.5–15.5)
WBC: 4.7 10*3/uL (ref 4.0–10.5)

## 2013-09-24 LAB — COMPREHENSIVE METABOLIC PANEL
ALT: 16 U/L (ref 0–53)
AST: 22 U/L (ref 0–37)
Albumin: 4.1 g/dL (ref 3.5–5.2)
Alkaline Phosphatase: 69 U/L (ref 39–117)
BUN: 16 mg/dL (ref 6–23)
CO2: 26 mEq/L (ref 19–32)
Chloride: 103 mEq/L (ref 96–112)
Creatinine, Ser: 1.1 mg/dL (ref 0.50–1.35)
GFR calc Af Amer: 78 mL/min — ABNORMAL LOW (ref >90)
GFR calc non Af Amer: 68 mL/min — ABNORMAL LOW (ref >90)
Potassium: 3.9 mEq/L (ref 3.5–5.1)
Total Bilirubin: 0.8 mg/dL (ref 0.3–1.2)

## 2013-09-24 LAB — TYPE AND SCREEN

## 2013-09-24 LAB — URINALYSIS, ROUTINE W REFLEX MICROSCOPIC
Glucose, UA: NEGATIVE mg/dL
Leukocytes, UA: NEGATIVE
Nitrite: NEGATIVE
Protein, ur: NEGATIVE mg/dL
Specific Gravity, Urine: 1.018 (ref 1.005–1.030)
pH: 6.5 (ref 5.0–8.0)

## 2013-09-24 LAB — SURGICAL PCR SCREEN
MRSA, PCR: NEGATIVE
Staphylococcus aureus: NEGATIVE

## 2013-09-24 LAB — PROTIME-INR
INR: 1.17 (ref 0.00–1.49)
Prothrombin Time: 14.7 seconds (ref 11.6–15.2)

## 2013-09-24 LAB — APTT: aPTT: 30 seconds (ref 24–37)

## 2013-09-24 NOTE — Progress Notes (Signed)
09/24/13 0909  OBSTRUCTIVE SLEEP APNEA  Have you ever been diagnosed with sleep apnea through a sleep study? No  Do you snore loudly (loud enough to be heard through closed doors)?  0  Do you often feel tired, fatigued, or sleepy during the daytime? 0  Has anyone observed you stop breathing during your sleep? 0  Do you have, or are you being treated for high blood pressure? 1  BMI more than 35 kg/m2? 1  Age over 67 years old? 1  Neck circumference greater than 40 cm/18 inches? 0  Gender: 1  Obstructive Sleep Apnea Score 4  Score 4 or greater  Results sent to PCP

## 2013-09-24 NOTE — Pre-Procedure Instructions (Signed)
Marc Maldonado  09/24/2013   Your procedure is scheduled on:  Wednesday, October 02, 2013  Report to [facility] Short Stay (use Main Entrance "A'') at 11:00 AM.  Call this number if you have problems the morning of surgery: [phone]   Remember:   Do not eat food or drink liquids after midnight.   Take these medicines the morning of surgery with A SIP OF WATER: Tamsulosin HCl (FLOMAX) 0.4 MG CAPS, verapamil (CALAN-SR) 240 MG CR tablet Stop taking Aspirin, Multivitamins and herbal medications (co-enzyme Q-10 30 MG capsule, Garlic 600 MG CAPS, Glucosamine Sulfate 500 MG CAPS, Lecithin 20 mg with 30 i.u. Vitamin E,   Do not take any NSAIDs ie: Ibuprofen, Advil, Naproxen or any medication containing Aspirin.  Do not wear jewelry, make-up or nail polish.  Do not wear lotions, powders, or perfumes. You may wear deodorant.  Do not shave 48 hours prior to surgery. Men may shave face and neck.  Do not bring valuables to the hospital.  [facility] is not responsible for any belongings or valuables.               Contacts, dentures or bridgework may not be worn into surgery.  Leave suitcase in the car. After surgery it may be brought to your room.  For patients admitted to the hospital, discharge time is determined by your  treatment team.               Patients discharged the day of surgery will not be allowed to drive home.  Name and phone number of your driver:   Special Instructions: Shower using CHG 2 nights before surgery and the night before surgery.  If you shower the day of surgery use CHG.  Use special wash - you have one bottle of CHG for all showers.  You should use approximately 1/3 of the bottle for each shower.   Please read over the following fact sheets that you were given: Pain Booklet, Coughing and Deep Breathing, Blood Transfusion Information, MRSA Information and Surgical Site Infection Prevention

## 2013-09-25 LAB — URINE CULTURE
Colony Count: NO GROWTH
Culture: NO GROWTH

## 2013-10-01 MED ORDER — CEFAZOLIN SODIUM-DEXTROSE 2-3 GM-% IV SOLR
2.0000 g | INTRAVENOUS | Status: AC
  Administered 2013-10-02: 2 g via INTRAVENOUS
  Filled 2013-10-01: qty 50

## 2013-10-01 NOTE — Progress Notes (Signed)
Time change noted pt to be here at 0845 on Dec 10th  For surgery at 1145.  Message left for pt to return call to let us know he got this message.

## 2013-10-01 NOTE — Progress Notes (Signed)
Pt called back, is aware to arrive at 0845 on 10/02/13 for surgery time change.

## 2013-10-02 ENCOUNTER — Inpatient Hospital Stay (HOSPITAL_COMMUNITY): Payer: Medicare Other | Admitting: Anesthesiology

## 2013-10-02 ENCOUNTER — Encounter (HOSPITAL_COMMUNITY): Payer: Medicare Other | Admitting: Anesthesiology

## 2013-10-02 ENCOUNTER — Inpatient Hospital Stay (HOSPITAL_COMMUNITY): Payer: Medicare Other

## 2013-10-02 ENCOUNTER — Encounter (HOSPITAL_COMMUNITY)
Admission: RE | Disposition: A | Discharge status: Home Health | Payer: Self-pay | Source: Ambulatory Visit | Attending: Orthopedic Surgery

## 2013-10-02 ENCOUNTER — Inpatient Hospital Stay (HOSPITAL_COMMUNITY)
Admission: RE | Admit: 2013-10-02 | Discharge: 2013-10-03 | DRG: 468 | Disposition: A | Discharge status: Home Health | Payer: Medicare Other | Source: Ambulatory Visit | Attending: Orthopedic Surgery | Admitting: Orthopedic Surgery

## 2013-10-02 ENCOUNTER — Encounter (HOSPITAL_COMMUNITY): Payer: Self-pay | Admitting: Anesthesiology

## 2013-10-02 DIAGNOSIS — T84093D Other mechanical complication of internal left knee prosthesis, subsequent encounter: Secondary | ICD-10-CM

## 2013-10-02 DIAGNOSIS — N400 Enlarged prostate without lower urinary tract symptoms: Secondary | ICD-10-CM | POA: Diagnosis present

## 2013-10-02 DIAGNOSIS — Z96659 Presence of unspecified artificial knee joint: Secondary | ICD-10-CM

## 2013-10-02 DIAGNOSIS — T84039A Mechanical loosening of unspecified internal prosthetic joint, initial encounter: Principal | ICD-10-CM | POA: Diagnosis present

## 2013-10-02 DIAGNOSIS — Y92009 Unspecified place in unspecified non-institutional (private) residence as the place of occurrence of the external cause: Secondary | ICD-10-CM

## 2013-10-02 DIAGNOSIS — M1710 Unilateral primary osteoarthritis, unspecified knee: Secondary | ICD-10-CM | POA: Diagnosis present

## 2013-10-02 DIAGNOSIS — N189 Chronic kidney disease, unspecified: Secondary | ICD-10-CM | POA: Diagnosis present

## 2013-10-02 DIAGNOSIS — Z8249 Family history of ischemic heart disease and other diseases of the circulatory system: Secondary | ICD-10-CM

## 2013-10-02 DIAGNOSIS — Y831 Surgical operation with implant of artificial internal device as the cause of abnormal reaction of the patient, or of later complication, without mention of misadventure at the time of the procedure: Secondary | ICD-10-CM | POA: Diagnosis present

## 2013-10-02 DIAGNOSIS — I129 Hypertensive chronic kidney disease with stage 1 through stage 4 chronic kidney disease, or unspecified chronic kidney disease: Secondary | ICD-10-CM | POA: Diagnosis present

## 2013-10-02 DIAGNOSIS — M179 Osteoarthritis of knee, unspecified: Secondary | ICD-10-CM | POA: Diagnosis present

## 2013-10-02 DIAGNOSIS — M129 Arthropathy, unspecified: Secondary | ICD-10-CM | POA: Diagnosis present

## 2013-10-02 DIAGNOSIS — K219 Gastro-esophageal reflux disease without esophagitis: Secondary | ICD-10-CM | POA: Diagnosis present

## 2013-10-02 HISTORY — PX: TOTAL KNEE REVISION: SHX996

## 2013-10-02 SURGERY — TOTAL KNEE REVISION
Anesthesia: General | Site: Knee | Laterality: Left

## 2013-10-02 MED ORDER — MENTHOL 3 MG MT LOZG: 1.0000 | LOZENGE | OROMUCOSAL | Status: DC | PRN

## 2013-10-02 MED ORDER — POTASSIUM CHLORIDE IN NACL 20-0.9 MEQ/L-% IV SOLN
INTRAVENOUS | Status: DC
  Administered 2013-10-03: 02:00:00 via INTRAVENOUS
  Filled 2013-10-02 (×4): qty 1000

## 2013-10-02 MED ORDER — TAMSULOSIN HCL 0.4 MG PO CAPS
0.4000 mg | ORAL_CAPSULE | Freq: Every day | ORAL | Status: DC
  Administered 2013-10-03: 0.4 mg via ORAL
  Filled 2013-10-02 (×2): qty 1

## 2013-10-02 MED ORDER — DEXAMETHASONE SODIUM PHOSPHATE 4 MG/ML IJ SOLN
INTRAMUSCULAR | Status: DC | PRN
  Administered 2013-10-02: 8 mg via INTRAVENOUS

## 2013-10-02 MED ORDER — NEOSTIGMINE METHYLSULFATE 1 MG/ML IJ SOLN
INTRAMUSCULAR | Status: DC | PRN
  Administered 2013-10-02: 2 mg via INTRAVENOUS

## 2013-10-02 MED ORDER — ROCURONIUM BROMIDE 100 MG/10ML IV SOLN
INTRAVENOUS | Status: DC | PRN
  Administered 2013-10-02: 50 mg via INTRAVENOUS

## 2013-10-02 MED ORDER — BUPIVACAINE HCL (PF) 0.25 % IJ SOLN
INTRAMUSCULAR | Status: AC
  Filled 2013-10-02: qty 30

## 2013-10-02 MED ORDER — LACTATED RINGERS IV SOLN
INTRAVENOUS | Status: DC | PRN
  Administered 2013-10-02 (×2): via INTRAVENOUS

## 2013-10-02 MED ORDER — METOCLOPRAMIDE HCL 10 MG PO TABS: 5.0000 mg | ORAL_TABLET | Freq: Three times a day (TID) | ORAL | Status: DC | PRN

## 2013-10-02 MED ORDER — CELECOXIB 200 MG PO CAPS
200.0000 mg | ORAL_CAPSULE | Freq: Two times a day (BID) | ORAL | Status: DC
  Administered 2013-10-02 – 2013-10-03 (×2): 200 mg via ORAL
  Filled 2013-10-02 (×3): qty 1

## 2013-10-02 MED ORDER — CEFAZOLIN SODIUM 1-5 GM-% IV SOLN
1.0000 g | Freq: Four times a day (QID) | INTRAVENOUS | Status: AC
  Administered 2013-10-02 (×2): 1 g via INTRAVENOUS
  Filled 2013-10-02 (×2): qty 50

## 2013-10-02 MED ORDER — DEXAMETHASONE 6 MG PO TABS
10.0000 mg | ORAL_TABLET | Freq: Three times a day (TID) | ORAL | Status: AC
  Administered 2013-10-02 – 2013-10-03 (×3): 10 mg via ORAL
  Filled 2013-10-02 (×3): qty 1

## 2013-10-02 MED ORDER — METHOCARBAMOL 500 MG PO TABS
500.0000 mg | ORAL_TABLET | Freq: Four times a day (QID) | ORAL | Status: DC | PRN
  Administered 2013-10-02: 500 mg via ORAL

## 2013-10-02 MED ORDER — ASPIRIN EC 325 MG PO TBEC
325.0000 mg | DELAYED_RELEASE_TABLET | Freq: Every day | ORAL | Status: DC
  Administered 2013-10-03: 325 mg via ORAL
  Filled 2013-10-02 (×2): qty 1

## 2013-10-02 MED ORDER — TADALAFIL 5 MG PO TABS: 2.5000 mg | ORAL_TABLET | Freq: Every day | ORAL | Status: DC

## 2013-10-02 MED ORDER — HYDROMORPHONE HCL PF 1 MG/ML IJ SOLN
INTRAMUSCULAR | Status: AC
  Filled 2013-10-02: qty 1

## 2013-10-02 MED ORDER — LACTATED RINGERS IV SOLN
INTRAVENOUS | Status: DC
  Administered 2013-10-02: 09:00:00 via INTRAVENOUS

## 2013-10-02 MED ORDER — DIPHENHYDRAMINE HCL 12.5 MG/5ML PO ELIX: 12.5000 mg | ORAL_SOLUTION | ORAL | Status: DC | PRN

## 2013-10-02 MED ORDER — METHOCARBAMOL 500 MG PO TABS
ORAL_TABLET | ORAL | Status: AC
  Filled 2013-10-02: qty 1

## 2013-10-02 MED ORDER — LIDOCAINE HCL (CARDIAC) 20 MG/ML IV SOLN
INTRAVENOUS | Status: DC | PRN
  Administered 2013-10-02: 100 mg via INTRAVENOUS

## 2013-10-02 MED ORDER — PHENOL 1.4 % MT LIQD: 1.0000 | OROMUCOSAL | Status: DC | PRN

## 2013-10-02 MED ORDER — LISINOPRIL 20 MG PO TABS
20.0000 mg | ORAL_TABLET | Freq: Every day | ORAL | Status: DC
Start: 2013-10-02 — End: 2013-10-03
  Administered 2013-10-02: 20 mg via ORAL
  Filled 2013-10-02 (×2): qty 1

## 2013-10-02 MED ORDER — ONDANSETRON HCL 4 MG PO TABS
4.0000 mg | ORAL_TABLET | Freq: Four times a day (QID) | ORAL | Status: DC | PRN
  Administered 2013-10-02: 4 mg via ORAL
  Filled 2013-10-02: qty 1

## 2013-10-02 MED ORDER — HYDROMORPHONE HCL PF 1 MG/ML IJ SOLN: 0.5000 mg | INTRAMUSCULAR | Status: DC | PRN

## 2013-10-02 MED ORDER — MIDAZOLAM HCL 5 MG/5ML IJ SOLN
INTRAMUSCULAR | Status: DC | PRN
  Administered 2013-10-02: 2 mg via INTRAVENOUS

## 2013-10-02 MED ORDER — METOCLOPRAMIDE HCL 5 MG/ML IJ SOLN
5.0000 mg | Freq: Three times a day (TID) | INTRAMUSCULAR | Status: DC | PRN
Start: 2013-10-02 — End: 2013-10-03

## 2013-10-02 MED ORDER — OXYCODONE-ACETAMINOPHEN 5-325 MG PO TABS
1.0000 | ORAL_TABLET | ORAL | Status: DC | PRN
  Administered 2013-10-02 – 2013-10-03 (×4): 2 via ORAL
  Filled 2013-10-02 (×4): qty 2

## 2013-10-02 MED ORDER — PROPOFOL 10 MG/ML IV BOLUS
INTRAVENOUS | Status: DC | PRN
  Administered 2013-10-02: 200 mg via INTRAVENOUS

## 2013-10-02 MED ORDER — DEXAMETHASONE SODIUM PHOSPHATE 10 MG/ML IJ SOLN
10.0000 mg | Freq: Three times a day (TID) | INTRAMUSCULAR | Status: AC
  Filled 2013-10-02 (×3): qty 1

## 2013-10-02 MED ORDER — ONDANSETRON HCL 4 MG/2ML IJ SOLN
4.0000 mg | Freq: Four times a day (QID) | INTRAMUSCULAR | Status: DC | PRN
  Administered 2013-10-03: 4 mg via INTRAVENOUS
  Filled 2013-10-02: qty 2

## 2013-10-02 MED ORDER — CHLORHEXIDINE GLUCONATE 4 % EX LIQD: 60.0000 mL | Freq: Once | CUTANEOUS | Status: DC

## 2013-10-02 MED ORDER — GLYCOPYRROLATE 0.2 MG/ML IJ SOLN
INTRAMUSCULAR | Status: DC | PRN
  Administered 2013-10-02: 0.2 mg via INTRAVENOUS

## 2013-10-02 MED ORDER — ZOLPIDEM TARTRATE 5 MG PO TABS: 5.0000 mg | ORAL_TABLET | Freq: Every evening | ORAL | Status: DC | PRN

## 2013-10-02 MED ORDER — DOCUSATE SODIUM 100 MG PO CAPS
100.0000 mg | ORAL_CAPSULE | Freq: Two times a day (BID) | ORAL | Status: DC
  Administered 2013-10-02 – 2013-10-03 (×2): 100 mg via ORAL
  Filled 2013-10-02 (×3): qty 1

## 2013-10-02 MED ORDER — SODIUM CHLORIDE 0.9 % IJ SOLN
INTRAMUSCULAR | Status: DC | PRN
  Administered 2013-10-02: 14:00:00

## 2013-10-02 MED ORDER — METHOCARBAMOL 100 MG/ML IJ SOLN
500.0000 mg | Freq: Four times a day (QID) | INTRAVENOUS | Status: DC | PRN
  Filled 2013-10-02: qty 5

## 2013-10-02 MED ORDER — BUPIVACAINE LIPOSOME 1.3 % IJ SUSP
20.0000 mL | INTRAMUSCULAR | Status: DC
  Filled 2013-10-02: qty 20

## 2013-10-02 MED ORDER — FENTANYL CITRATE 0.05 MG/ML IJ SOLN
INTRAMUSCULAR | Status: DC | PRN
  Administered 2013-10-02 (×5): 50 ug via INTRAVENOUS

## 2013-10-02 MED ORDER — ONDANSETRON HCL 4 MG/2ML IJ SOLN: 4.0000 mg | Freq: Once | INTRAMUSCULAR | Status: DC | PRN

## 2013-10-02 MED ORDER — SODIUM CHLORIDE 0.9 % IR SOLN
Status: DC | PRN
  Administered 2013-10-02: 3000 mL

## 2013-10-02 MED ORDER — VERAPAMIL HCL ER 240 MG PO TBCR
240.0000 mg | EXTENDED_RELEASE_TABLET | Freq: Every day | ORAL | Status: DC
  Filled 2013-10-02 (×2): qty 1

## 2013-10-02 MED ORDER — ONDANSETRON HCL 4 MG/2ML IJ SOLN
INTRAMUSCULAR | Status: DC | PRN
  Administered 2013-10-02: 4 mg via INTRAVENOUS

## 2013-10-02 MED ORDER — HYDROMORPHONE HCL PF 1 MG/ML IJ SOLN
0.2500 mg | INTRAMUSCULAR | Status: DC | PRN
  Administered 2013-10-02 (×4): 0.5 mg via INTRAVENOUS

## 2013-10-02 MED ORDER — BUPIVACAINE HCL 0.25 % IJ SOLN
INTRAMUSCULAR | Status: DC | PRN
  Administered 2013-10-02: 10 mL

## 2013-10-02 SURGICAL SUPPLY — 64 items
BANDAGE ELASTIC 4 VELCRO ST LF (GAUZE/BANDAGES/DRESSINGS) ×2 IMPLANT
BANDAGE ELASTIC 6 VELCRO ST LF (GAUZE/BANDAGES/DRESSINGS) ×2 IMPLANT
BANDAGE ESMARK 6X9 LF (GAUZE/BANDAGES/DRESSINGS) ×1 IMPLANT
BLADE SAG 18X100X1.27 (BLADE) ×2 IMPLANT
BLADE SAW SGTL 13.0X1.19X90.0M (BLADE) ×1 IMPLANT
BLADE SURG ROTATE 9660 (MISCELLANEOUS) IMPLANT
BNDG CMPR 9X6 STRL LF SNTH (GAUZE/BANDAGES/DRESSINGS) ×1
BNDG ESMARK 6X9 LF (GAUZE/BANDAGES/DRESSINGS) ×2
BOWL SMART MIX CTS (DISPOSABLE) ×1 IMPLANT
CEMENT BONE SIMPLEX SPEEDSET (Cement) ×3 IMPLANT
CLOTH BEACON ORANGE TIMEOUT ST (SAFETY) ×2 IMPLANT
COVER BACK TABLE 24X17X13 BIG (DRAPES) IMPLANT
COVER SURGICAL LIGHT HANDLE (MISCELLANEOUS) ×2 IMPLANT
CUFF TOURNIQUET SINGLE 34IN LL (TOURNIQUET CUFF) ×1 IMPLANT
DRAPE EXTREMITY T 121X128X90 (DRAPE) ×2 IMPLANT
DRAPE U-SHAPE 47X51 STRL (DRAPES) ×2 IMPLANT
DURAPREP 26ML APPLICATOR (WOUND CARE) ×3 IMPLANT
ELECT REM PT RETURN 9FT ADLT (ELECTROSURGICAL) ×2
ELECTRODE REM PT RTRN 9FT ADLT (ELECTROSURGICAL) ×1 IMPLANT
EVACUATOR 1/8 PVC DRAIN (DRAIN) ×2 IMPLANT
FACESHIELD LNG OPTICON STERILE (SAFETY) ×5 IMPLANT
GAUZE XEROFORM 1X8 LF (GAUZE/BANDAGES/DRESSINGS) ×1 IMPLANT
GLOVE BIO SURGEON STRL SZ8 (GLOVE) ×1 IMPLANT
GLOVE BIOGEL PI IND STRL 8.5 (GLOVE) ×1 IMPLANT
GLOVE BIOGEL PI INDICATOR 8.5 (GLOVE)
GLOVE ORTHO TXT STRL SZ7.5 (GLOVE) ×2 IMPLANT
GOWN PREVENTION PLUS XXLARGE (GOWN DISPOSABLE) ×2 IMPLANT
GOWN STRL NON-REIN LRG LVL3 (GOWN DISPOSABLE) ×6 IMPLANT
HANDPIECE INTERPULSE COAX TIP (DISPOSABLE) ×2
IMMOBILIZER KNEE 22 UNIV (SOFTGOODS) ×1 IMPLANT
INSERT TIBIA SCORPIO FLX 11X18 (Orthopedic Implant) ×1 IMPLANT
KIT BASIN OR (CUSTOM PROCEDURE TRAY) ×2 IMPLANT
KIT ROOM TURNOVER OR (KITS) ×2 IMPLANT
MANIFOLD NEPTUNE II (INSTRUMENTS) ×2 IMPLANT
NS IRRIG 1000ML POUR BTL (IV SOLUTION) ×2 IMPLANT
PACK TOTAL JOINT (CUSTOM PROCEDURE TRAY) ×2 IMPLANT
PAD ARMBOARD 7.5X6 YLW CONV (MISCELLANEOUS) ×4 IMPLANT
PAD CAST 4YDX4 CTTN HI CHSV (CAST SUPPLIES) ×1 IMPLANT
PADDING CAST COTTON 4X4 STRL (CAST SUPPLIES) ×2
PADDING CAST COTTON 6X4 STRL (CAST SUPPLIES) ×2 IMPLANT
PATELLA OSTEO MDOME (Orthopedic Implant) ×1 IMPLANT
SET HNDPC FAN SPRY TIP SCT (DISPOSABLE) IMPLANT
SPONGE GAUZE 4X4 12PLY (GAUZE/BANDAGES/DRESSINGS) ×2 IMPLANT
STEM FLUTED REGULAR 15X80MM (Stem) ×1 IMPLANT
SUCTION FRAZIER TIP 10 FR DISP (SUCTIONS) ×1 IMPLANT
SUT VIC AB 0 CT1 27 (SUTURE) ×4
SUT VIC AB 0 CT1 27XBRD ANBCTR (SUTURE) IMPLANT
SUT VIC AB 1 CTX 36 (SUTURE) ×4
SUT VIC AB 1 CTX36XBRD ANBCTR (SUTURE) ×1 IMPLANT
SUT VIC AB 2-0 CT1 27 (SUTURE) ×4
SUT VIC AB 2-0 CT1 TAPERPNT 27 (SUTURE) ×1 IMPLANT
SUT VIC AB 2-0 FS1 27 (SUTURE) ×2 IMPLANT
SUT VIC AB 2-0 SH 27 (SUTURE)
SUT VIC AB 2-0 SH 27XBRD (SUTURE) IMPLANT
SUT VIC AB 3-0 SH 27 (SUTURE)
SUT VIC AB 3-0 SH 27X BRD (SUTURE) IMPLANT
SUT VLOC 180 0 24IN GS25 (SUTURE) ×1 IMPLANT
SYR 50ML LL SCALE MARK (SYRINGE) ×2 IMPLANT
TOWEL OR 17X24 6PK STRL BLUE (TOWEL DISPOSABLE) ×2 IMPLANT
TOWEL OR 17X26 10 PK STRL BLUE (TOWEL DISPOSABLE) ×2 IMPLANT
TRAY FOLEY CATH 16FRSI W/METER (SET/KITS/TRAYS/PACK) IMPLANT
TRAY TIBIAL  MOD (Orthopedic Implant) ×1 IMPLANT
TUBE ANAEROBIC SPECIMEN COL (MISCELLANEOUS) ×1 IMPLANT
WATER STERILE IRR 1000ML POUR (IV SOLUTION) ×6 IMPLANT

## 2013-10-02 NOTE — Progress Notes (Signed)
Orthopedic Tech Progress Note Patient Details:  Marc Maldonado August 05, 1946 [id number]  CPM Left Knee CPM Left Knee: On Left Knee Flexion (Degrees): 60 Left Knee Extension (Degrees): 0 Additional Comments: put on ohf and footsie roll  Ortho Devices Ortho Device/Splint Interventions: Ordered;Application   [name] 10/02/2013, 3:31 PM

## 2013-10-02 NOTE — Interval H&P Note (Signed)
History and Physical Interval Note:  10/02/2013 8:31 AM  Marc Maldonado  has presented today for surgery, with the diagnosis of Mechanical complication of internal orthopedic device  The various methods of treatment have been discussed with the patient and family. After consideration of risks, benefits and other options for treatment, the patient has consented to  Procedure(s): TOTAL KNEE REVISION (Left) as a surgical intervention .  The patient's history has been reviewed, patient examined, no change in status, stable for surgery.  I have reviewed the patient's chart and labs.  Questions were answered to the patient's satisfaction.     Carman Essick F

## 2013-10-02 NOTE — Anesthesia Preprocedure Evaluation (Addendum)
Anesthesia Evaluation  Patient identified by MRN, date of birth, ID band Patient awake    Reviewed: Allergy & Precautions, H&P , NPO status , Patient's Chart, lab work & pertinent test results  History of Anesthesia Complications Negative for: history of anesthetic complications  Airway       Dental   Pulmonary neg pulmonary ROS,          Cardiovascular hypertension, Pt. on medications     Neuro/Psych negative neurological ROS  negative psych ROS   GI/Hepatic GERD-  Medicated and Controlled,  Endo/Other    Renal/GU Renal disease     Musculoskeletal   Abdominal   Peds  Hematology   Anesthesia Other Findings   Reproductive/Obstetrics                          Anesthesia Physical Anesthesia Plan  ASA: II  Anesthesia Plan: General   Post-op Pain Management:    Induction: Intravenous  Airway Management Planned: Oral ETT  Additional Equipment:   Intra-op Plan:   Post-operative Plan: Extubation in OR  Informed Consent: I have reviewed the patients History and Physical, chart, labs and discussed the procedure including the risks, benefits and alternatives for the proposed anesthesia with the patient or authorized representative who has indicated his/her understanding and acceptance.     Plan Discussed with:   Anesthesia Plan Comments:         Anesthesia Quick Evaluation

## 2013-10-02 NOTE — Progress Notes (Signed)
Dr. Eulah Pont notified of x ray report showing possible tibial fracture. He does not see much concern at this time but will do repeat xray full length of his tibia.

## 2013-10-02 NOTE — H&P (View-Only) (Signed)
[facility] [address]   [address] , [place] [address] [phone] A Division of [facility]  Daniel F. Murphy, M.D.   Robert A. Wainer, M.D.   W. Dan Caffrey, M.D.   Joshua P. Landau, M.D.   Anna Voytek, M.D Timothy D. Murphy, M.D.  [place] R. Ibazebo, M.D.  James S. Kramer, M.D.    Rebecca S. Bassett, M.D. Kerra Guilfoil L. Anton, PA-C  Kirstin A. 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  Chronic pain.  Returns for follow up.  Symptoms are unchanged from previous visits and he wishes to proceed with revision of left total knee arthroplasty as scheduled.  He has significant pain with activities that is affecting his quality of life and ability to perform ADLs. He has failed to respond to conservative treatment options.   Current medications: Verapamil 240 mg, Lisinopril 20 mg, Tamsulosin 0.4 mg, Tadalafil 2.5 mg and Zolpidem 5 mg at bedtime.  He is also taking Vitamin C 250 mg, natural B 12 mg, Calcium, magnesium D 250 mg, Vitamin E 290 mg, Vitamin E 30 I.U., CoQ10 30 mg, glucose health garlic 600 mg, D3 and Glucosamine Sulfate.   Allergies: No known drug allergies.  Past medical history: Positive for tinnitus, high blood pressure, cancerous colon polyps, kidney stones, previous bladder infection, blood in urine and nightly urination.   Past surgical history: Significant for left total knee replacement in 2003, right total knee replacement in 2008, right shoulder surgery in 2010, right wrist surgery in 2013, left wrist surgery in 2013, left shoulder surgery in 2013 and right knee surgery in 2014. Review of systems: Patient currently denies lightheadedness, dizziness, fevers, chills,  chest pain, pressure, palpitations, shortness of breath, or any other pulmonary issues, GI, GU or neuro issues.  Family history: Significant for heart attack in his father and high blood pressure in his mother and his father.         Social history: Does not smoke or drink.  He is married and lives with his wife.      EXAMINATION: Height: 5?10.  Weight: 243 pounds.  Temperature: 99.0.  Blood pressure: 177/96.  Pulse: 64. O2 SAT: 98% on room air.  Alert and oriented x 3 and in no acute distress.  Does not have an antalgic at this time.  Head is normocephalic, a traumatic.  PERRLA, EOMI.  Neck: Unremarkable.  Lungs: CTA bilaterally.  No wheezes, rales or rhonchi.  Heart: RRR.  No murmurs appreciated.  Abdomen: Soft and non-tender.  NBS x 4.  Calves: Soft and non-tender bilaterally.  Neuro: Grossly intact to bilateral upper and lower extremities.  Skin: Warm and dry.  Examination of his left knee reveals good range of motion from about 0 degrees extension to about 120 degrees of extension.  He does have some varus alignment, more so in the left knee than the right.  He does have some tenderness to palpation in the medial joint line. He is neurovascularly intact distally.     X-RAYS: Previous imaging of his left knee reveals loosening of   the tibial component of his left total knee replacement.    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   Continued  [facility] [address]   [address] , [place] [address] [phone] A Division of [facility]  Daniel F. Murphy, M.D.   Robert A. Wainer, M.D.   W. Dan Caffrey, M.D.   Joshua P. Landau, M.D.   Anna Voytek, M.D Timothy D. Murphy, M.D.  [place] R. Ibazebo, M.D.  James S. Kramer, M.D.    Rebecca S. Bassett, M.D. Shashana Fullington L. Anton, PA-C  Kirstin A. Shepperson, PA-C  Josh  Chadwell, PA-C Brandon Parry, OPA-C   RE: Pattillo, Warren                                [phone]      DOB: 06/08/1946 PROGRESS NOTE: 09-17-13 PLAN: We will proceed with left knee revision surgery as scheduled.  Discussed the risks, benefits and possible complications of surgery.  Rehab and recovery time was discussed.  All questions were answered.  Patient will need home health at the time of discharge.  We will use Aspirin 325 mg for DVT prophylaxis.   At his last surgery he did require some Benadryl due to a possible reaction of pain medications given at that time.    Daniel F. Murphy, M.D.   Electronically verified by Daniel F. Murphy, M.D. DFM(MLA):jjh D 09-17-13 T 09-23-13  

## 2013-10-02 NOTE — Evaluation (Signed)
Physical Therapy Evaluation Patient Details Name: Marc Maldonado MRN: [id number] DOB: Aug 16, 1946 Today's Date: 10/02/2013 Time: 1700-1730 PT Time Calculation (min): 30 min  PT Assessment / Plan / Recommendation History of Present Illness  67 y.o. male admitted to [facility] on 10/02/13 s/p elective L TKA.  Orders written 10/02/13 post-op for tibial x-ray to r/o tibial fx.  x-rays still pending.    Clinical Impression  Pt is POD #0.  Per RN MD is confident that there is no tibial fx, x-rays ordered as a precaution.  RN ok with PT proceeding with evaluation.  PT kept pt TDWB and donned KI as precautions and kept pt in bed after as portable x-ray ordered and pt needs to be in bed not chair.  Pt just recently had his R TKA done and went home on crutches on POD #1 (crutches brought to room to use for tomorrow's therapy session).  He has quite a few stairs and will need to show proficiency prior to d/c home.  I anticipate that he will progress well enough for HHPT at discharge.   PT to follow acutely for deficits listed below.       PT Assessment  Patient needs continued PT services    Follow Up Recommendations  Home health PT;Supervision/Assistance - 24 hour    Does the patient have the potential to tolerate intense rehabilitation     NA  Barriers to Discharge   None      Equipment Recommendations  None recommended by PT    Recommendations for Other Services    None  Frequency 7X/week    Precautions / Restrictions Precautions Precautions: Knee Required Braces or Orthoses: Knee Immobilizer - Left Knee Immobilizer - Left: Other (comment) (until discontinued) Restrictions Weight Bearing Restrictions: Yes LLE Weight Bearing: Weight bearing as tolerated   Pertinent Vitals/Pain See vitals flow sheet.      Mobility  Bed Mobility Bed Mobility: Supine to Sit;Sitting - Scoot to Edge of Bed;Sit to Supine Supine to Sit: 5: Supervision;With rails;HOB elevated Sitting - Scoot to Edge of Bed:  5: Supervision;With rail Sit to Supine: 4: Min guard;With rail;HOB flat Details for Bed Mobility Assistance: min guard assist only to help bring left leg over the side of the bed to get back to supine.  Supervision for safety.  Transfers Transfers: Sit to Stand;Stand to Sit Sit to Stand: 4: Min assist;With upper extremity assist;With armrests;From bed Stand to Sit: 4: Min assist;With upper extremity assist;With armrests;To bed Details for Transfer Assistance: Encouraged pt to put more weight through his right leg for now due to f/o fx left tibia.  Pt needed to stand to attempt to urinate EOB.  Min assist to steady pt for balance as he was using both hands to manage the urinal.  Ambulation/Gait Ambulation/Gait Assistance: Not tested (comment) (due to r/o fx)    Exercises Total Joint Exercises Ankle Circles/Pumps: AROM;Both;10 reps;Supine (encouraged for antiembolic purposes.)   PT Diagnosis: Difficulty walking;Abnormality of gait;Generalized weakness;Acute pain  PT Problem List: Decreased strength;Decreased range of motion;Decreased activity tolerance;Decreased balance;Decreased mobility;Pain PT Treatment Interventions: DME instruction;Gait training;Stair training;Functional mobility training;Therapeutic exercise;Therapeutic activities;Balance training;Neuromuscular re-education;Patient/family education;Modalities     PT Goals(Current goals can be found in the care plan section) Acute Rehab PT Goals Patient Stated Goal: to do as well with this knee as he did with his other knee PT Goal Formulation: With patient/family Time For Goal Achievement: 10/09/13 Potential to Achieve Goals: Good  Visit Information  Last PT Received On: 10/02/13 Assistance Needed: +  1 Reason Eval/Treat Not Completed: Medical issues which prohibited therapy History of Present Illness: 67 y.o. male admitted to [facility] on 10/02/13 s/p elective L TKA.  Orders written 10/02/13 post-op for tibial x-ray to r/o tibial fx.   x-rays still pending.         Prior Functioning  Home Living Family/patient expects to be discharged to:: Private residence Living Arrangements: Spouse/significant other Available Help at Discharge: Family;Available 24 hours/day Type of Home: House Home Access: Stairs to enter [employer] of Steps: 14 Entrance Stairs-Rails: Right Home Layout: Multi-level;Bed/bath upstairs Alternate Level Stairs-Number of Steps: 14 Alternate Level Stairs-Rails: Right Home Equipment: Crutches Additional Comments: per pt he has elevated toilets Prior Function Level of Independence: Independent Comments: per pt he and his wife are semi retired.  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  He had his right knee replaced ~2 months ago Communication Communication: No difficulties Dominant Hand: Right    Cognition  Cognition Arousal/Alertness: Awake/alert Behavior During Therapy: WFL for tasks assessed/performed Overall Cognitive Status: Within Functional Limits for tasks assessed    Extremity/Trunk Assessment Upper Extremity Assessment Upper Extremity Assessment: Overall WFL for tasks assessed Lower Extremity Assessment Lower Extremity Assessment: RLE deficits/detail;LLE deficits/detail RLE Deficits / Details: R TKA ~2 months ago, flexion ROM 120.  Strength 5/5 LLE Deficits / Details: normal post op pain and weakness, ankle 4/5, knee 3-/5, hip 3-/5 Cervical / Trunk Assessment Cervical / Trunk Assessment: Normal   Balance Balance Balance Assessed: Yes Static Sitting Balance Static Sitting - Balance Support: No upper extremity supported;Feet supported Static Sitting - Level of Assistance: 5: Stand by assistance Static Sitting - Comment/# of Minutes: supervision due to reports of lightheadedness.  Static Standing Balance Static Standing - Balance Support: No upper extremity supported;Bilateral upper extremity supported Static Standing - Level of Assistance: 4: Min assist Static  Standing - Comment/# of Minutes: min assist for balance at trunk and safey while pt using both hands to manage the urinal.    End of Session CPM Left Knee CPM Left Knee: On Left Knee Flexion (Degrees): 60 Left Knee Extension (Degrees): 0 Additional Comments: on at 1530       Alexandro Line B. Tequia Wolman, PT, DPT [phone]   10/02/2013, 5:50 PM

## 2013-10-02 NOTE — Transfer of Care (Signed)
Immediate Anesthesia Transfer of Care Note  Patient: Marc Maldonado  Procedure(s) Performed: Procedure(s): TOTAL KNEE REVISION, tibial component (Left)  Patient Location: PACU  Anesthesia Type:General  Level of Consciousness: awake, alert  and oriented  Airway & Oxygen Therapy: Patient Spontanous Breathing and Patient connected to nasal cannula oxygen  Post-op Assessment: Report given to PACU RN and Post -op Vital signs reviewed and stable  Post vital signs: Reviewed and stable  Complications: No apparent anesthesia complications

## 2013-10-02 NOTE — Preoperative (Signed)
Beta Blockers   Reason not to administer Beta Blockers:Not Applicable 

## 2013-10-02 NOTE — Progress Notes (Signed)
PT Cancellation Note  Patient Details Name: Marc Maldonado MRN: [id number] DOB: [birth year]   Cancelled Treatment:    Reason Eval/Treat Not Completed: Medical issues which prohibited therapy.  Notes say pt going to x-ray to r/o tibial fracture.  PT to hold eval until AM.  Thanks,   Lurena Joiner B. Anamarie Hunn, PT, DPT [phone]   10/02/2013, 5:03 PM

## 2013-10-02 NOTE — Plan of Care (Signed)
Problem: Consults Goal: Diagnosis- Total Joint Replacement Revision Total Knee: Left     

## 2013-10-02 NOTE — Anesthesia Postprocedure Evaluation (Signed)
  Anesthesia Post-op Note  Patient: Marc Maldonado  Procedure(s) Performed: Procedure(s): TOTAL KNEE REVISION, tibial component (Left)  Patient Location: PACU  Anesthesia Type:General  Level of Consciousness: awake and alert   Airway and Oxygen Therapy: Patient Spontanous Breathing  Post-op Pain: mild  Post-op Assessment: Post-op Vital signs reviewed, Patient's Cardiovascular Status Stable, Respiratory Function Stable, Patent Airway, No signs of Nausea or vomiting and Pain level controlled  Post-op Vital Signs: Reviewed and stable  Complications: No apparent anesthesia complications

## 2013-10-03 ENCOUNTER — Encounter (HOSPITAL_COMMUNITY): Payer: Self-pay | Admitting: Orthopedic Surgery

## 2013-10-03 LAB — BASIC METABOLIC PANEL
CO2: 26 mEq/L (ref 19–32)
Chloride: 103 mEq/L (ref 96–112)
Creatinine, Ser: 1.2 mg/dL (ref 0.50–1.35)
GFR calc Af Amer: 71 mL/min — ABNORMAL LOW (ref >90)
Glucose, Bld: 167 mg/dL — ABNORMAL HIGH (ref 70–99)
Potassium: 4.2 mEq/L (ref 3.5–5.1)
Sodium: 135 mEq/L (ref 135–145)

## 2013-10-03 LAB — CBC
MCV: 96.4 fL (ref 78.0–100.0)
Platelets: 117 10*3/uL — ABNORMAL LOW (ref 150–400)
RBC: 3.07 MIL/uL — ABNORMAL LOW (ref 4.22–5.81)
RDW: 12.7 % (ref 11.5–15.5)
WBC: 12.3 10*3/uL — ABNORMAL HIGH (ref 4.0–10.5)

## 2013-10-03 MED ORDER — METHOCARBAMOL 500 MG PO TABS: 500.0000 mg | ORAL_TABLET | Freq: Four times a day (QID) | ORAL | Status: DC | PRN

## 2013-10-03 MED ORDER — ASPIRIN 325 MG PO TBEC: 325.0000 mg | DELAYED_RELEASE_TABLET | Freq: Every day | ORAL | Status: DC

## 2013-10-03 NOTE — Op Note (Signed)
Marc Maldonado, Marc Maldonado NO.:  [id number]  MEDICAL RECORD NO.:  [id number]  LOCATION:  [id number]                        FACILITY:  [facility]  PHYSICIAN:  Loreta Ave, M.D. DATE OF BIRTH:  October 18, 1946  DATE OF PROCEDURE:  10/02/2013 DATE OF DISCHARGE:                              OPERATIVE REPORT   PREOPERATIVE DIAGNOSIS:  Left knee status post total knee replacement. Evidence of aseptic loosening of the tibial component with significant polyethylene wear on both the tibia and patellar components.  POSTOPERATIVE DIAGNOSIS:  Left knee status post total knee replacement. Evidence of aseptic loosening of the tibial component with significant polyethylene wear on both the tibia and patellar components with exuberant proliferative synovitis from polyethylene wear.  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  PROCEDURE:  Left knee exam under anesthesia.  Open revision of the tibial and patellar components.  Patella revise to a cemented pegged medial offset #11 component.  Tibia revised to a cemented #11 component with a 15 x 18 mm stem, 18 mm polyethylene insert.  SURGEON:  Loreta Ave, M.D.  ASSISTANT:  Odelia Gage, PA and Skip Mayer, [place] present throughout the entire case, necessary for timely completion of procedure.  ANESTHESIA:  General.  BLOOD LOSS:  Minimal.  SPECIMENS:  None.  CULTURES:  None.  COMPLICATIONS:  None.  DRESSINGS:  Soft compressive knee immobilizer.  DRAINS:  Hemovac x1.  TOURNIQUET TIME:  1 hour 30 minutes.  DESCRIPTION OF PROCEDURE:  The patient was brought to the operating room, placed on the operating table in supine position.  After adequate anesthesia had been obtained, tourniquet applied.  Prepped and draped in usual sterile fashion.  Exsanguinated with elevation of Esmarch. Tourniquet inflated to 350 mmHg.  Knee examined.  Fairly good extension and flexion 100 degrees.  No  instability.  I used previous anterior incision.  Skin and subcutaneous tissue divided.  Medial arthrotomy, vastus splitting, preserving quad tendon.  Extensive reactive synovitis and effusion.  Proliferative synovitis throughout the entire knee all compartments from the polyethylene breakdown.  Adhesions hypertrophic synovitis, all debrided throughout.  No evidence of infection. Significant wear of the tibial polyethylene and patella polyethylene components.  The femoral component had a little micro motion not grossly loose and in good condition.  The patella was exposed.  It was then sewn off through the base of the recess patella.  This exposed good reasonable bow behind.  This was drilled, sized, and fitted for a 11 mm component.  Attention turned to the tibia.  Polyethylene extracted.  The tibia was grossly loose.  After freeing up the knee adequately and compromising about a third of the tibial tubercle attachment, I could get good access completely removed the component.  Marked cystic changes in the tibia with extensive osteolysis and soft tissue overgrowth and proliferative synovitis all taken out.  I then used intramedullary guide, a resurfacing cut resecting about 7 mm down from the original cement mantle.  All extra cement removed.  The areas of osteolysis were then debrided, multiple drilling to get up to good bleeding bone.  I then went to a sequence of proximal and distal fitting.  Eventually chose a 11 mm component setting rotation anatomically and then 15 x 18 mm stem.  After appropriate trials, the definitive component was assembled.  Copious irrigation with a pulse irrigating device first. Cement prepared.  The tibial components seated.  I attached 18 mm polyethylene insert to reduce the knee.  The patellar component was cemented.  At completion, with all components firmly cemented, knee was examined.  Full extension, full flexion, good alignment, good stability, good  patellar tracking.  Wound irrigated once again.  Hemovac was placed and brought out through a separate stab wound.  Arthrotomy closed with #1 Vicryl skin and subcutaneous tissue with Vicryl and staples.  Prior to closure, soft tissues injected with Exparel.  Margins were injected with Marcaine.  Sterile compressive dressing applied.  Tourniquet deflated and removed.  Knee immobilizer applied.  Anesthesia reversed. Brought to the recovery room.  Tolerated the surgery well.  No complications.     Loreta Ave, M.D.     DFM/MEDQ  D:  10/02/2013  T:  10/03/2013  Job:  [id number]

## 2013-10-03 NOTE — Discharge Summary (Signed)
Patient ID: Marc Maldonado MRN: [id number] DOB/AGE: [birth year] 67 y.o.  Admit date: 10/02/2013 Discharge date: 10/03/2013  Admission Diagnoses:  Active Problems:   DJD (degenerative joint disease) of knee   Discharge Diagnoses:  Same  Past Medical History  Diagnosis Date  . BPH (benign prostatic hyperplasia)   . Wears glasses   . Hypertension   . GERD (gastroesophageal reflux disease)     occas. indigestion   . Chronic kidney disease     renal stone-(2) tumors removed fr. bladder  . 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 Arthritis     low back & neck,- DDD  . DJD (degenerative joint disease)     Surgeries: Procedure(s): TOTAL KNEE REVISION, tibial component on 10/02/2013   Consultants:    Discharged Condition: Improved  Hospital Course: ZYAD BOOMER is an 67 y.o. male who was admitted 10/02/2013 for operative treatment of<principal problem not specified>. Patient has severe unremitting pain that affects sleep, daily activities, and work/hobbies. After pre-op clearance the patient was taken to the operating room on 10/02/2013 and underwent  Procedure(s): TOTAL KNEE REVISION, tibial component.    Patient was given perioperative antibiotics: Anti-infectives   Start     Dose/Rate Route Frequency Ordered Stop   10/02/13 1800  ceFAZolin (ANCEF) IVPB 1 g/50 mL premix     1 g 100 mL/hr over 30 Minutes Intravenous Every 6 hours 10/02/13 1619 10/03/13 0007   10/02/13 0600  ceFAZolin (ANCEF) IVPB 2 g/50 mL premix     2 g 100 mL/hr over 30 Minutes Intravenous On call to O.R. 10/01/13 1446 10/02/13 1203       Patient was given sequential compression devices, early ambulation, and chemoprophylaxis to prevent DVT.  Patient benefited maximally from hospital stay and there were no complications.    Recent vital signs: Patient Vitals for the past 24 hrs:  BP Temp Temp src Pulse Resp SpO2  10/03/13 0609 116/63 mmHg 98.7 F  (37.1 C) - 68 16 96 %  10/03/13 0210 122/69 mmHg 98.2 F (36.8 C) - 58 16 96 %  10/03/13 0000 - - - - 18 96 %  10/02/13 2211 118/75 mmHg 98.3 F (36.8 C) - 80 16 93 %  10/02/13 2000 - - - - 16 98 %  10/02/13 1620 132/84 mmHg 98.2 F (36.8 C) - 74 18 97 %  10/02/13 1600 132/91 mmHg 97.6 F (36.4 C) - 73 20 96 %  10/02/13 1550 136/70 mmHg - - 67 16 92 %  10/02/13 1535 132/91 mmHg - - 64 15 100 %  10/02/13 1521 123/73 mmHg - - 60 14 93 %  10/02/13 1515 - - - 60 14 -  10/02/13 1505 136/72 mmHg - - 58 12 100 %  10/02/13 1500 - - - 59 14 -  10/02/13 1450 127/60 mmHg 98.6 F (37 C) - 49 13 98 %  10/02/13 0905 156/84 mmHg 97.9 F (36.6 C) Oral 60 20 9 %     Recent laboratory studies:  Recent Labs  10/03/13 0405  WBC 12.3*  HGB 10.3*  HCT 29.6*  PLT 117*  NA 135  K 4.2  CL 103  CO2 26  BUN 20  CREATININE 1.20  GLUCOSE 167*  CALCIUM 8.3*     Discharge Medications:     Medication List         ALEVE 220 MG Caps  Generic drug:  Naproxen  Sodium  Take 440 mg by mouth daily as needed (pain).     ALLERGY MED PO  Take 1 tablet by mouth daily as needed (allergies).     aspirin 325 MG EC tablet  Take 1 tablet (325 mg total) by mouth daily with breakfast.     b complex vitamins tablet  Take 1 tablet by mouth daily.     Calcium-Magnesium-Vitamin D 500-250-125 MG-MG-UNIT Tabs  Take 1 tablet by mouth daily.     cholecalciferol 1000 UNITS tablet  Commonly known as:  VITAMIN D  Take 1,000 Units by mouth daily.     co-enzyme Q-10 30 MG capsule  Take 30 mg by mouth daily.     diphenhydramine-acetaminophen 25-500 MG Tabs  Commonly known as:  TYLENOL PM  Take 2 tablets by mouth at bedtime as needed (sleep/pain).     famotidine-calcium carbonate-magnesium hydroxide 10-800-165 MG Chew chewable tablet  Commonly known as:  PEPCID COMPLETE  Chew 1 tablet by mouth daily as needed (heartburn).     Garlic 600 MG Caps  Take 600 mg by mouth daily.     Glucosamine Sulfate 500  MG Caps  Take 500 mg by mouth daily.     lisinopril 20 MG tablet  Commonly known as:  PRINIVIL,ZESTRIL  Take 20 mg by mouth daily.     methocarbamol 500 MG tablet  Commonly known as:  ROBAXIN  Take 1 tablet (500 mg total) by mouth every 6 (six) hours as needed for muscle spasms.     multivitamin with minerals tablet  Take 1 tablet by mouth daily.     NASAL SPRAY NA  Place 1 spray into both nostrils at bedtime as needed (congestion).     OVER THE COUNTER MEDICATION  Lecithin 20 mg with 30 i.u. Vitamin E     OVER THE COUNTER MEDICATION  Take 1 tablet by mouth daily. Glucose health     tadalafil 5 MG tablet  Commonly known as:  CIALIS  Take 2.5 mg by mouth daily after supper.     tamsulosin 0.4 MG Caps capsule  Commonly known as:  FLOMAX  Take 0.4 mg by mouth daily before breakfast.     verapamil 240 MG CR tablet  Commonly known as:  CALAN-SR  Take 240 mg by mouth daily before breakfast.     vitamin C 250 MG tablet  Commonly known as:  ASCORBIC ACID  Take 250 mg by mouth daily.     zolpidem 5 MG tablet  Commonly known as:  AMBIEN  Take 2.5 mg by mouth at bedtime as needed for sleep.        Diagnostic Studies: Dg Knee 1-2 Views Left  2013-10-15   CLINICAL DATA:  Status post revision of knee replacement.  EXAM: LEFT KNEE - 1-2 VIEW  COMPARISON:  None.  FINDINGS: Left total knee arthroplasty is in place. The device is located. There is a linear lucency seen on the lateral view only just below the inferior margin of the tibial stem which could represent a nondisplaced fracture. Gas the soft tissues and surgical staples are noted. A drain is in place.  IMPRESSION: Left knee replacement. Cortical lucency just distal to the tibial stem could represent a nondisplaced fracture. Recommend attention on follow-up examinations.  Critical Value/emergent results were called by telephone at the time of interpretation on 10/15/13 at 4:43 PM to Dr. James Ivanoff , who verbally acknowledged  these results.   Electronically Signed   By: Drusilla Kanner M.D.  On: 10/02/2013 16:44   Dg Tibia/fibula Left  10/02/2013   CLINICAL DATA:  Knee pain.  EXAM: LEFT TIBIA AND FIBULA - 2 VIEW  COMPARISON:  10/02/2013.  FINDINGS: The femoral and tibial components appear well seated. There is a small cortical fracture extending down from the tip of the prosthesis approximately 3.8 cm. This is best seen on the lateral films.  IMPRESSION: Small cortical fracture extending from the tip of the tibial prosthesis.   Electronically Signed   By: Loralie Champagne M.D.   On: 10/02/2013 19:59    Disposition: 06-Home-Health Care Svc      Discharge Orders   Future Orders Complete By Expires   Call MD / Call 911  As directed    Comments:     If you experience chest pain or shortness of breath, CALL 911 and be transported to the hospital emergency room.  If you develope a fever above 101 F, pus (white drainage) or increased drainage or redness at the wound, or calf pain, call your surgeon's office.   Change dressing  As directed    Comments:     Change dressing on Friday, then change the dressing daily with sterile 4 x 4 inch gauze dressing and apply TED hose.  You may clean the incision with alcohol prior to redressing.   Change dressing  As directed    Comments:     Change the dressing daily with sterile 4 x 4 inch gauze dressing and paper tape.  You may clean the incision with alcohol prior to redressing   Constipation Prevention  As directed    Comments:     Drink plenty of fluids.  Prune juice may be helpful.  You may use a stool softener, such as Colace (over the counter) 100 mg twice a day.  Use MiraLax (over the counter) for constipation as needed.   CPM  As directed    Comments:     Continuous passive motion machine (CPM):      Use the CPM from 0- to 60 for 6 hours per day.      You may increase by 10 per day.  You may break it up into 2 or 3 sessions per day.      Use CPM for 3-4 weeks or until  you are told to stop.   Diet - low sodium heart healthy  As directed    Discharge instructions  As directed    Comments:     Please continue with touch down weight bearing.  May change dressing starting Friday.  May shower on Monday, but do not soak wound.  May ice for up to 20 minutes at a time for pain and swelling.  Continue using CPM machine.  Follow-up in office in 2 weeks.   Do not put a pillow under the knee. Place it under the heel.  As directed    Comments:     Place gray foam under operative heel when in bed or in a chair to work on extension   Follow the hip precautions as taught in Physical Therapy  As directed    Comments:     Posterior total hip precautions.  Weight bearing as tolerated   Increase activity slowly as tolerated  As directed    TED hose  As directed    Comments:     Use stockings (TED hose) for 2 weeks on both leg(s).  You may remove them at night for sleeping.   TED hose  As directed    Comments:     Use stockings (TED hose) for 4 weeks on both leg(s).  You may remove them at night for sleeping.      Follow-up Information   Follow up with [facility], MD In 2 weeks.   Specialty:  Orthopedic Surgery   Contact information:   [address] [place] [address] [phone]        Signed: Gearldine Shown 10/03/2013, 7:18 AM

## 2013-10-03 NOTE — Progress Notes (Signed)
Gearldine Shown, PA-C notified of x-ray results.

## 2013-10-03 NOTE — Care Management Note (Signed)
CM received call from Fernand Parkins, [facility] liasion. She has arranged HHPT for patient. Vance Peper, RN BSN

## 2013-10-03 NOTE — Progress Notes (Signed)
Subjective: 1 Day Post-Op Procedure(s) (LRB): TOTAL KNEE REVISION, tibial component (Left) Patient reports pain as 2 on 0-10 scale.  Pt is doing very well with pain and knee motion.  He has been voiding, but no gas and no bm as of yet.  No nausea and no vomiting.  It does appear that he has a small cortical tibia fracture extending about 3.8 cm from the tip of his prosthesis.  He will be touch down weight bearing for now.    Objective: Vital signs in last 24 hours: Temp:  [97.6 F (36.4 C)-98.7 F (37.1 C)] 98.7 F (37.1 C) (12/11 0609) Pulse Rate:  [49-80] 68 (12/11 0609) Resp:  [12-20] 16 (12/11 0609) BP: (116-156)/(60-91) 116/63 mmHg (12/11 0609) SpO2:  [9 %-100 %] 96 % (12/11 0609)  Intake/Output from previous day: 12/10 0701 - 12/11 0700 In: 2141.7 [I.V.:2091.7; IV Piggyback:50] Out: 975 [Urine:450; Drains:450; Blood:75] Intake/Output this shift:     Recent Labs  10/03/13 0405  HGB 10.3*    Recent Labs  10/03/13 0405  WBC 12.3*  RBC 3.07*  HCT 29.6*  PLT 117*    Recent Labs  10/03/13 0405  NA 135  K 4.2  CL 103  CO2 26  BUN 20  CREATININE 1.20  GLUCOSE 167*  CALCIUM 8.3*   No results found for this basename: LABPT, INR,  in the last 72 hours  Neurologically intact ABD soft Neurovascular intact Sensation intact distally Intact pulses distally Dorsiflexion/Plantar flexion intact Hemovac drain pulled today by me Negative Homan's  Assessment/Plan: 1 Day Post-Op Procedure(s) (LRB): TOTAL KNEE REVISION, tibial component (Left) Advance diet Up with therapy D/C IV fluids Discharge home with home health  ANTON, M. Mardella Layman 10/03/2013, 7:05 AM

## 2013-10-03 NOTE — Evaluation (Signed)
Occupational Therapy Evaluation Patient Details Name: Marc Maldonado MRN: [id number] DOB: 12-Sep-1946 Today's Date: 10/03/2013 Time: 1191-4782 OT Time Calculation (min): 30 min  OT Assessment / Plan / Recommendation History of present illness 67 y.o. male admitted to [facility] on 10/02/13 s/p elective L TKA.  Orders written 10/02/13 post-op for tibial x-ray to r/o tibial fx.  X-rays revealed small cortical tibia fracture extending about 3.8 cm from tip of prosthesis. Pt is now TDWB LLE.     Clinical Impression   Pt presents w/ L TKA & now TTWB secondary to small cortical tibia fracture. Pt reports that he has DME & will have assist PRN at home. He reports no further acute OT needs at this time. Recommend 24/PRN assist at d/c secondary to WB precautions. Pt currently Min guard assist for shower transfers & LB bathe/dressing & supervision level for toileting noted. Will sign off OT at this time.    OT Assessment  Patient does not need any further OT services    Follow Up Recommendations  No OT follow up; 24/supervision/PRN assist   Barriers to Discharge      Equipment Recommendations  None recommended by OT;Other (comment) (Pt states he has DME & reports no further OT needs)    Recommendations for Other Services    Frequency       Precautions / Restrictions Precautions Precautions: Knee Restrictions Weight Bearing Restrictions: Yes LLE Weight Bearing: Touchdown weight bearing   Pertinent Vitals/Pain Pt denies pain Left LE/knee. "I don't have any pain" BP before activity was 132/65 HR 63 sitting and after treatment = 123/59 HR 64 sitting.      ADL  Eating/Feeding: Performed;Independent Where Assessed - Eating/Feeding: Chair Grooming: Performed;Wash/dry hands;Supervision/safety Where Assessed - Grooming: Supported standing Upper Body Bathing: Simulated;Set up Where Assessed - Upper Body Bathing: Supported sitting;Unsupported sitting Lower Body Bathing: Simulated;Min guard Where  Assessed - Lower Body Bathing: Supported sit to stand Upper Body Dressing: Performed;Modified independent Where Assessed - Upper Body Dressing: Unsupported sitting Lower Body Dressing: Simulated;Min guard Where Assessed - Lower Body Dressing: Supported sit to stand Toilet Transfer: Research scientist (life sciences) Method: Sit to Barista: Comfort height toilet;Grab bars Toileting - Architect and Hygiene: Supervision/safety Where Assessed - Engineer, mining and Hygiene: Sit to stand from 3-in-1 or toilet Tub/Shower Transfer: Simulated;Min guard (Simulated w/ towel rolls in room & in ADL gym w/ small step) Tub/Shower Transfer Method: Ambulating;Anterior-posterior Tub/Shower Transfer Equipment: Other (comment);Walk in shower (Pt has walk in shower w/ seat built in) Equipment Used: Gait belt;Other (comment) (Crutches) Transfers/Ambulation Related to ADLs: Pt overall supervision level assist for toilet transfers, vc's for TDWB LLE. Min guard assist for shower transfers at this time. ADL Comments: Pt was educated in role of OT. He performed toileting transfers, grooming standing at sink and simulated shower transfer in room using towel roll given Min guard assist. Pt then ambulated into rehab gym using crutches, and simulated shower transfer w/ small step anterior transfer and then posterior transfer. Pt required Min guard assist & vc's for TDWB status. Pt reports no further acute OT needs at this time. BP before activity was 132/65 HR 63 sitting and after treatment = 123/59 HR 64 sitting.    OT Diagnosis:    OT Problem List:   OT Treatment Interventions:     OT Goals(Current goals can be found in the care plan section) Acute Rehab OT Goals Patient Stated Goal: Home w/ spouse assist  Visit Information  Last OT Received  On: 10/03/13 History of Present Illness: 67 y.o. male admitted to [facility] on 10/02/13 s/p elective L TKA.  Orders written  10/02/13 post-op for tibial x-ray to r/o tibial fx.  X-rays revealed small cortical tibia fracture extending about 3.8 cm from tip of prosthesis. Pt is now TDWB LLE.         Prior Functioning     Home Living Family/patient expects to be discharged to:: Private residence Living Arrangements: Spouse/significant other Available Help at Discharge: Family;Available 24 hours/day Type of Home: House Home Access: Stairs to enter [employer] of Steps: 14 Entrance Stairs-Rails: Left Home Layout: Multi-level;Bed/bath upstairs Alternate Level Stairs-Number of Steps: 14 Alternate Level Stairs-Rails: Left Home Equipment: Crutches;Shower seat - built in Additional Comments: per pt he has elevated toilets Prior Function Level of Independence: Independent Comments: per pt he and his wife are semi retired.  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  He had his right knee replaced ~2 months ago Communication Communication: No difficulties Dominant Hand: Right    Vision/Perception Vision - History Baseline Vision: Wears glasses all the time Patient Visual Report: No change from baseline   Cognition  Cognition Arousal/Alertness: Awake/alert Behavior During Therapy: WFL for tasks assessed/performed Overall Cognitive Status: Within Functional Limits for tasks assessed    Extremity/Trunk Assessment Upper Extremity Assessment Upper Extremity Assessment: Overall WFL for tasks assessed Lower Extremity Assessment Lower Extremity Assessment: Defer to PT evaluation Cervical / Trunk Assessment Cervical / Trunk Assessment: Normal    Mobility Bed Mobility Bed Mobility: Not assessed (Pt up in chair) Transfers Transfers: Sit to Stand;Stand to Sit Sit to Stand: 5: Supervision;From chair/3-in-1;From toilet;With armrests Stand to Sit: 5: Supervision;To chair/3-in-1;To toilet;With armrests Details for Transfer Assistance: Verbal encouragement for TDWB LLE. Pt states he is only performing  TDWB. Pt needed Min guard for shower transfers as performed today.        Balance Balance Balance Assessed: Yes Static Sitting Balance Static Sitting - Balance Support: No upper extremity supported;Feet supported Static Sitting - Level of Assistance: 6: Modified independent (Device/Increase time) Static Standing Balance Static Standing - Balance Support: No upper extremity supported;Bilateral upper extremity supported Static Standing - Level of Assistance: 5: Stand by assistance   End of Session OT - End of Session Equipment Utilized During Treatment: Gait belt;Other (comment) (Crutches) Activity Tolerance: Patient tolerated treatment well Patient left: in chair;with call bell/phone within reach Nurse Communication: Other (comment) (BP prior 132/65 HR 63, after treat BP 123/59 HR 64 sitting) CPM Left Knee CPM Left Knee: Off Additional Comments: completed 1.5 hours t  GO     Alm Bustard 10/03/2013, 10:53 AM

## 2013-10-03 NOTE — Progress Notes (Signed)
RN walked into room and patient covered in emesis. Patient states he didn't recall vomiting and felt slightly nauseous and dizzy. BP checked 98/44. Moved patient to side of bed and cleaned gown. Patient complaining of dizziness and BP 84/44 HR 104. Will notify PA and continue to monitor

## 2013-10-03 NOTE — Progress Notes (Signed)
Physical Therapy Treatment Patient Details Name: CHILD CAMPOY MRN: [id number] DOB: [birth year] Today's Date: 10/03/2013 Time: 4098-1191 PT Time Calculation (min): 40 min  PT Assessment / Plan / Recommendation  History of Present Illness 67 y.o. male admitted to [facility] on 10/02/13 s/p elective L TKA.  Orders written 10/02/13 post-op for tibial x-ray to r/o tibial fx.  X-rays revealed small cortical tibia fracture extending about 3.8 cm from tip of prosthesis. Pt is now TDWB LLE.     PT Comments   Pt moving well.  Has had episodes of decreased BP earlier this AM.  BP monitored & RN made aware.    denies dizziness with activity.     Follow Up Recommendations  Home health PT;Supervision/Assistance - 24 hour     Does the patient have the potential to tolerate intense rehabilitation     Barriers to Discharge        Equipment Recommendations  None recommended by PT    Recommendations for Other Services    Frequency 7X/week   Progress towards PT Goals Progress towards PT goals: Progressing toward goals  Plan Current plan remains appropriate    Precautions / Restrictions Precautions Precautions: Knee Restrictions LLE Weight Bearing: Touchdown weight bearing    Pertinent Vitals/Pain BP:  Supine- 115/62         Sitting- 121/64         Standing- 107/57 & then re-took per pt's request & BP 93/51 but denied dizziness         Sitting- 103/58         Sitting after ambulation- 119/72        **Made RN aware of BP & how BP dropped 2nd time while standing & ok'd to proceed with therapy due to pt asymptomatic.     Mobility  Bed Mobility Bed Mobility: Not assessed Transfers Transfers: Sit to Stand;Stand to Sit Sit to Stand: 5: Supervision;With upper extremity assist;With armrests;From chair/3-in-1 Stand to Sit: 5: Supervision;With upper extremity assist;With armrests;To chair/3-in-1 Details for Transfer Assistance: Pt demonstrates safe technique & crutch  placement Ambulation/Gait Ambulation/Gait Assistance: 4: Min guard Ambulation Distance (Feet): 140 Feet Assistive device: Crutches Ambulation/Gait Assistance Details: Guarding for safety due to pt with decreased BP earlier but pt denies dizziness at this time.  Demonstrates proper sequencing & maintains TDWBing Gait Pattern: Step-through pattern Gait velocity: decreased Stairs: Yes Stairs Assistance: 4: Min guard Stair Management Technique: Step to pattern;Forwards;With crutches;One rail Left (bil crutches on opposite side of rail) Number of Stairs: 5 Wheelchair Mobility Wheelchair Mobility: No      PT Goals (current goals can now be found in the care plan section) Acute Rehab PT Goals Patient Stated Goal: Home w/ spouse assist PT Goal Formulation: With patient/family Time For Goal Achievement: 10/09/13 Potential to Achieve Goals: Good  Visit Information  Last PT Received On: 10/03/13 Assistance Needed: +1 History of Present Illness: 67 y.o. male admitted to [facility] on 10/02/13 s/p elective L TKA.  Orders written 10/02/13 post-op for tibial x-ray to r/o tibial fx.  X-rays revealed small cortical tibia fracture extending about 3.8 cm from tip of prosthesis. Pt is now TDWB LLE.      Subjective Data  Patient Stated Goal: Home w/ spouse assist   Cognition  Cognition Arousal/Alertness: Awake/alert Behavior During Therapy: WFL for tasks assessed/performed Overall Cognitive Status: Within Functional Limits for tasks assessed    Balance     End of Session CPM Left Knee CPM Left Knee: On Left Knee Flexion (Degrees): 70  Left Knee Extension (Degrees): 0   GP     Lara Mulch 10/03/2013, 3:09 PM  Verdell Face, PTA [phone] 10/03/2013

## 2013-10-03 NOTE — Progress Notes (Signed)
Following episode of nausea and dizziness patient insistent on getting to chair. Able to ambulate to chair with assist. BP taken 86/54. Patient put supine in chair BP increased to 103/50 HR 80 O2 sats 98.

## 2013-10-03 NOTE — Care Management Note (Signed)
CARE MANAGEMENT NOTE 10/03/2013  Patient:  Marc Maldonado, Marc Maldonado   Account Number:  [id number]  Date Initiated:  10/03/2013  Documentation initiated by:  Vance Peper  Subjective/Objective Assessment:   67 yr old male s/p Left total knee revision     Action/Plan:   Patient has rolling walker, 3in1. CPM to be delivered by [employer].  Patient states he has used [facility] in past. Call out to Fernand Parkins, [place] [facility] to arrange [facility] PT.   Anticipated DC Date:  10/03/2013   Anticipated DC Plan:  HOME W HOME HEALTH SERVICES      DC Planning Services  CM consult      [facility] Choice  HOME HEALTH   Choice offered to / List presented to:  C-1 Patient        HH arranged  HH-2 PT      [facility] agency  [facility]   Status of service:  In process, will continue to follow Medicare Important Message given?   (If response is "NO", the following Medicare IM given date fields will be blank) Date Medicare IM given:   Date Additional Medicare IM given:    Discharge Disposition:  HOME W HOME HEALTH SERVICES  Per UR Regulation:

## 2013-10-08 ENCOUNTER — Encounter: Payer: Self-pay | Admitting: Cardiology

## 2013-10-08 ENCOUNTER — Ambulatory Visit (HOSPITAL_COMMUNITY): Payer: Medicare Other | Attending: Cardiology

## 2013-10-08 DIAGNOSIS — M79609 Pain in unspecified limb: Secondary | ICD-10-CM

## 2013-10-08 DIAGNOSIS — R609 Edema, unspecified: Secondary | ICD-10-CM

## 2013-10-08 DIAGNOSIS — T8454XA Infection and inflammatory reaction due to internal left knee prosthesis, initial encounter: Secondary | ICD-10-CM | POA: Insufficient documentation

## 2013-10-08 DIAGNOSIS — M7989 Other specified soft tissue disorders: Secondary | ICD-10-CM

## 2013-10-08 DIAGNOSIS — Z96659 Presence of unspecified artificial knee joint: Secondary | ICD-10-CM | POA: Insufficient documentation

## 2013-10-08 DIAGNOSIS — I10 Essential (primary) hypertension: Secondary | ICD-10-CM | POA: Insufficient documentation

## 2013-10-15 DIAGNOSIS — T8459XA Infection and inflammatory reaction due to other internal joint prosthesis, initial encounter: Secondary | ICD-10-CM

## 2013-10-17 ENCOUNTER — Encounter (HOSPITAL_COMMUNITY): Payer: Self-pay | Admitting: Emergency Medicine

## 2013-10-17 ENCOUNTER — Inpatient Hospital Stay (HOSPITAL_COMMUNITY)
Admission: EM | Admit: 2013-10-17 | Discharge: 2013-10-22 | DRG: 486 | Disposition: A | Discharge status: Home / Self Care | Payer: Medicare Other | Attending: Orthopedic Surgery | Admitting: Orthopedic Surgery

## 2013-10-17 DIAGNOSIS — R7881 Bacteremia: Secondary | ICD-10-CM

## 2013-10-17 DIAGNOSIS — Y831 Surgical operation with implant of artificial internal device as the cause of abnormal reaction of the patient, or of later complication, without mention of misadventure at the time of the procedure: Secondary | ICD-10-CM | POA: Diagnosis present

## 2013-10-17 DIAGNOSIS — T84093A Other mechanical complication of internal left knee prosthesis, initial encounter: Secondary | ICD-10-CM

## 2013-10-17 DIAGNOSIS — R739 Hyperglycemia, unspecified: Secondary | ICD-10-CM | POA: Diagnosis present

## 2013-10-17 DIAGNOSIS — I10 Essential (primary) hypertension: Secondary | ICD-10-CM | POA: Diagnosis present

## 2013-10-17 DIAGNOSIS — I129 Hypertensive chronic kidney disease with stage 1 through stage 4 chronic kidney disease, or unspecified chronic kidney disease: Secondary | ICD-10-CM | POA: Diagnosis present

## 2013-10-17 DIAGNOSIS — M009 Pyogenic arthritis, unspecified: Secondary | ICD-10-CM

## 2013-10-17 DIAGNOSIS — Z96659 Presence of unspecified artificial knee joint: Secondary | ICD-10-CM

## 2013-10-17 DIAGNOSIS — N400 Enlarged prostate without lower urinary tract symptoms: Secondary | ICD-10-CM | POA: Diagnosis present

## 2013-10-17 DIAGNOSIS — A4901 Methicillin susceptible Staphylococcus aureus infection, unspecified site: Secondary | ICD-10-CM | POA: Diagnosis present

## 2013-10-17 DIAGNOSIS — M129 Arthropathy, unspecified: Secondary | ICD-10-CM | POA: Diagnosis present

## 2013-10-17 DIAGNOSIS — K219 Gastro-esophageal reflux disease without esophagitis: Secondary | ICD-10-CM | POA: Diagnosis present

## 2013-10-17 DIAGNOSIS — M1710 Unilateral primary osteoarthritis, unspecified knee: Secondary | ICD-10-CM | POA: Diagnosis present

## 2013-10-17 DIAGNOSIS — D62 Acute posthemorrhagic anemia: Secondary | ICD-10-CM | POA: Diagnosis not present

## 2013-10-17 DIAGNOSIS — T8450XA Infection and inflammatory reaction due to unspecified internal joint prosthesis, initial encounter: Principal | ICD-10-CM | POA: Diagnosis present

## 2013-10-17 DIAGNOSIS — Z7982 Long term (current) use of aspirin: Secondary | ICD-10-CM

## 2013-10-17 DIAGNOSIS — D649 Anemia, unspecified: Secondary | ICD-10-CM | POA: Diagnosis present

## 2013-10-17 DIAGNOSIS — T8459XA Infection and inflammatory reaction due to other internal joint prosthesis, initial encounter: Secondary | ICD-10-CM

## 2013-10-17 DIAGNOSIS — N289 Disorder of kidney and ureter, unspecified: Secondary | ICD-10-CM | POA: Diagnosis present

## 2013-10-17 DIAGNOSIS — R7309 Other abnormal glucose: Secondary | ICD-10-CM | POA: Diagnosis present

## 2013-10-17 DIAGNOSIS — N189 Chronic kidney disease, unspecified: Secondary | ICD-10-CM | POA: Diagnosis present

## 2013-10-17 LAB — CBC WITH DIFFERENTIAL/PLATELET
Basophils Absolute: 0 10*3/uL (ref 0.0–0.1)
Basophils Relative: 0 % (ref 0–1)
Eosinophils Absolute: 0.1 10*3/uL (ref 0.0–0.7)
Eosinophils Relative: 1 % (ref 0–5)
HCT: 29.6 % — ABNORMAL LOW (ref 39.0–52.0)
Lymphocytes Relative: 2 % — ABNORMAL LOW (ref 12–46)
MCHC: 33.1 g/dL (ref 30.0–36.0)
MCV: 101 fL — ABNORMAL HIGH (ref 78.0–100.0)
Monocytes Absolute: 0.5 10*3/uL (ref 0.1–1.0)
Platelets: 177 10*3/uL (ref 150–400)
RBC: 2.93 MIL/uL — ABNORMAL LOW (ref 4.22–5.81)
RDW: 14.8 % (ref 11.5–15.5)
WBC: 19.3 10*3/uL — ABNORMAL HIGH (ref 4.0–10.5)

## 2013-10-17 LAB — COMPREHENSIVE METABOLIC PANEL
Albumin: 2.9 g/dL — ABNORMAL LOW (ref 3.5–5.2)
Alkaline Phosphatase: 63 U/L (ref 39–117)
BUN: 34 mg/dL — ABNORMAL HIGH (ref 6–23)
CO2: 26 mEq/L (ref 19–32)
Calcium: 8.4 mg/dL (ref 8.4–10.5)
Creatinine, Ser: 1.89 mg/dL — ABNORMAL HIGH (ref 0.50–1.35)
GFR calc Af Amer: 41 mL/min — ABNORMAL LOW (ref >90)
Glucose, Bld: 102 mg/dL — ABNORMAL HIGH (ref 70–99)
Potassium: 5.2 mEq/L — ABNORMAL HIGH (ref 3.5–5.1)
Total Protein: 5.2 g/dL — ABNORMAL LOW (ref 6.0–8.3)

## 2013-10-17 LAB — CBC
HCT: 28.3 % — ABNORMAL LOW (ref 39.0–52.0)
Hemoglobin: 9.4 g/dL — ABNORMAL LOW (ref 13.0–17.0)
MCH: 33.6 pg (ref 26.0–34.0)
MCHC: 33.2 g/dL (ref 30.0–36.0)
MCV: 101.1 fL — ABNORMAL HIGH (ref 78.0–100.0)
RDW: 14.9 % (ref 11.5–15.5)
WBC: 17.4 10*3/uL — ABNORMAL HIGH (ref 4.0–10.5)

## 2013-10-17 LAB — URINE MICROSCOPIC-ADD ON

## 2013-10-17 LAB — URINALYSIS, ROUTINE W REFLEX MICROSCOPIC
Glucose, UA: NEGATIVE mg/dL
Hgb urine dipstick: NEGATIVE
Ketones, ur: 15 mg/dL — AB
Nitrite: POSITIVE — AB
Protein, ur: NEGATIVE mg/dL
Specific Gravity, Urine: 1.028 (ref 1.005–1.030)
Urobilinogen, UA: 1 mg/dL (ref 0.0–1.0)
pH: 5 (ref 5.0–8.0)

## 2013-10-17 LAB — BASIC METABOLIC PANEL
Calcium: 8.3 mg/dL — ABNORMAL LOW (ref 8.4–10.5)
Chloride: 101 mEq/L (ref 96–112)
Creatinine, Ser: 1.65 mg/dL — ABNORMAL HIGH (ref 0.50–1.35)
GFR calc Af Amer: 48 mL/min — ABNORMAL LOW (ref >90)
GFR calc non Af Amer: 41 mL/min — ABNORMAL LOW (ref >90)
Glucose, Bld: 94 mg/dL (ref 70–99)
Sodium: 135 mEq/L (ref 135–145)

## 2013-10-17 LAB — PROTIME-INR
INR: 1.92 — ABNORMAL HIGH (ref 0.00–1.49)
Prothrombin Time: 21.4 seconds — ABNORMAL HIGH (ref 11.6–15.2)

## 2013-10-17 MED ORDER — LISINOPRIL 20 MG PO TABS
20.0000 mg | ORAL_TABLET | Freq: Every day | ORAL | Status: DC
  Administered 2013-10-17 – 2013-10-22 (×6): 20 mg via ORAL
  Filled 2013-10-17 (×7): qty 1

## 2013-10-17 MED ORDER — SODIUM CHLORIDE 0.9 % IV SOLN
Freq: Once | INTRAVENOUS | Status: AC
  Administered 2013-10-17: 14:00:00 via INTRAVENOUS

## 2013-10-17 MED ORDER — ONDANSETRON HCL 4 MG/2ML IJ SOLN
4.0000 mg | Freq: Once | INTRAMUSCULAR | Status: AC
  Administered 2013-10-17: 4 mg via INTRAVENOUS
  Filled 2013-10-17: qty 2

## 2013-10-17 MED ORDER — TAMSULOSIN HCL 0.4 MG PO CAPS
0.4000 mg | ORAL_CAPSULE | Freq: Every day | ORAL | Status: DC
  Administered 2013-10-17 – 2013-10-22 (×6): 0.4 mg via ORAL
  Filled 2013-10-17 (×9): qty 1

## 2013-10-17 MED ORDER — OXYCODONE HCL 5 MG PO TABS
5.0000 mg | ORAL_TABLET | ORAL | Status: DC | PRN
  Administered 2013-10-17 – 2013-10-22 (×15): 10 mg via ORAL
  Administered 2013-10-22: 5 mg via ORAL
  Administered 2013-10-22 (×2): 10 mg via ORAL
  Filled 2013-10-17 (×2): qty 2
  Filled 2013-10-17: qty 1
  Filled 2013-10-17 (×15): qty 2

## 2013-10-17 MED ORDER — LISINOPRIL 20 MG PO TABS: 20.0000 mg | ORAL_TABLET | Freq: Every day | ORAL | Status: DC

## 2013-10-17 MED ORDER — TAMSULOSIN HCL 0.4 MG PO CAPS: 0.4000 mg | ORAL_CAPSULE | Freq: Every day | ORAL | Status: DC

## 2013-10-17 MED ORDER — CHLORHEXIDINE GLUCONATE 4 % EX LIQD
60.0000 mL | Freq: Once | CUTANEOUS | Status: AC
  Administered 2013-10-18: 4 via TOPICAL
  Filled 2013-10-17: qty 60

## 2013-10-17 MED ORDER — ACETAMINOPHEN 500 MG PO TABS
1000.0000 mg | ORAL_TABLET | Freq: Once | ORAL | Status: AC
  Administered 2013-10-18: 1000 mg via ORAL
  Filled 2013-10-17: qty 2

## 2013-10-17 MED ORDER — DEXTROSE-NACL 5-0.45 % IV SOLN
100.0000 mL/h | INTRAVENOUS | Status: DC
  Administered 2013-10-17: 100 mL/h via INTRAVENOUS

## 2013-10-17 MED ORDER — DOCUSATE SODIUM 100 MG PO CAPS
100.0000 mg | ORAL_CAPSULE | Freq: Two times a day (BID) | ORAL | Status: DC
Start: 2013-10-17 — End: 2013-10-22
  Administered 2013-10-17 – 2013-10-22 (×10): 100 mg via ORAL
  Filled 2013-10-17 (×14): qty 1

## 2013-10-17 MED ORDER — ONDANSETRON HCL 4 MG PO TABS
4.0000 mg | ORAL_TABLET | Freq: Three times a day (TID) | ORAL | Status: DC | PRN
  Administered 2013-10-18 – 2013-10-22 (×3): 4 mg via ORAL
  Filled 2013-10-17 (×3): qty 1

## 2013-10-17 MED ORDER — CEFAZOLIN SODIUM 1-5 GM-% IV SOLN
1.0000 g | Freq: Three times a day (TID) | INTRAVENOUS | Status: DC
  Administered 2013-10-17 – 2013-10-18 (×3): 1 g via INTRAVENOUS
  Administered 2013-10-18: 2 g via INTRAVENOUS
  Administered 2013-10-19 – 2013-10-21 (×8): 1 g via INTRAVENOUS
  Filled 2013-10-17 (×17): qty 50

## 2013-10-17 MED ORDER — VERAPAMIL HCL ER 240 MG PO TBCR: 240.0000 mg | EXTENDED_RELEASE_TABLET | Freq: Every day | ORAL | Status: DC

## 2013-10-17 MED ORDER — DEXTROSE-NACL 5-0.45 % IV SOLN: INTRAVENOUS | Status: AC

## 2013-10-17 MED ORDER — MORPHINE SULFATE 4 MG/ML IJ SOLN
4.0000 mg | INTRAMUSCULAR | Status: DC | PRN
  Administered 2013-10-17 – 2013-10-21 (×7): 4 mg via INTRAVENOUS
  Filled 2013-10-17 (×7): qty 1

## 2013-10-17 MED ORDER — MORPHINE SULFATE 2 MG/ML IJ SOLN
0.5000 mg | INTRAMUSCULAR | Status: DC | PRN
  Administered 2013-10-21: 0.5 mg via INTRAVENOUS
  Filled 2013-10-17: qty 1

## 2013-10-17 MED ORDER — VERAPAMIL HCL ER 240 MG PO TBCR
240.0000 mg | EXTENDED_RELEASE_TABLET | Freq: Every day | ORAL | Status: DC
  Administered 2013-10-17 – 2013-10-22 (×6): 240 mg via ORAL
  Filled 2013-10-17 (×9): qty 1

## 2013-10-17 NOTE — ED Notes (Signed)
Upon arrival L knee noted to be swollen, warm to touch, draining yellow/red colored fluid. Recent L knee surgery on 12/10. Had staples removed several days ago. Toes also noted to be bruised, able to wiggle digits. Pulses present. Capillary refill less than 2 seconds. 8/10 pain at the time, increases with movement.

## 2013-10-17 NOTE — ED Notes (Signed)
EDP at bedside  

## 2013-10-17 NOTE — ED Provider Notes (Signed)
CSN: [id number]     Arrival date & time 10/17/13  1321 History   First MD Initiated Contact with Patient 10/17/13 1330     Chief Complaint  Patient presents with  . Knee Pain   (Consider location/radiation/quality/duration/timing/severity/associated sxs/prior Treatment) HPI Comments: Pt comes in with cc of CKD, s/p left knee revision post prior arthoplasty comes in with cc of knee pain. States that he started having increased knee pain starting y'day 3 pm. The pain is throbbing and worse with any movement. Pt has increased swelling and drainage from the wound site as well. No n/v/f/c. Pt was seen at [facility] - US DVT neg, Knee Xray shows effusion, and pt had WBC 17K. Pt was given 1 gram of vancomycin prior to transfer. Dr. Eulah Pont is the Orthopedic Surgeon, who was made aware of patient's transfer.  Patient is a 67 y.o. male presenting with knee pain. The history is provided by the patient.  Knee Pain Associated symptoms: no fever     Past Medical History  Diagnosis Date  . BPH (benign prostatic hyperplasia)   . Wears glasses   . Hypertension   . GERD (gastroesophageal reflux disease)     occas. indigestion   . Chronic kidney disease     renal stone-(2) tumors removed fr. bladder  . 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 Arthritis     low back & neck,- DDD  . DJD (degenerative joint disease)    Past Surgical History  Procedure Laterality Date  . Total knee arthroplasty  2008    rt  . Total knee arthroplasty  2003    left  . Tonsillectomy    . Carpal tunnel release  2012    rt and lt  . Hernia repair  2007    umb hr  . Cystoscopy with biopsy      bladder tumor  . Colonoscopy    . Shoulder arthroscopy w/ rotator cuff repair  2010 & 2013    bilateral   . Total knee revision Right 07/24/2013    Procedure: TOTAL KNEE REVISION;  Surgeon: Loreta Ave, MD;  Location: [facility] OR;  Service: Orthopedics;  Laterality: Right;  . Total  knee revision Left 10/02/2013    Procedure: TOTAL KNEE REVISION, tibial component;  Surgeon: Loreta Ave, MD;  Location: [facility] OR;  Service: Orthopedics;  Laterality: Left;   History reviewed. No pertinent family history. History  Substance Use Topics  . Smoking status: Never Smoker   . Smokeless tobacco: Never Used  . Alcohol Use: No    Review of Systems  Constitutional: Positive for activity change. Negative for fever and appetite change.  Respiratory: Negative for cough and shortness of breath.   Cardiovascular: Negative for chest pain.  Gastrointestinal: Negative for abdominal pain.  Genitourinary: Negative for dysuria.  Musculoskeletal: Positive for arthralgias.  Skin: Positive for rash and wound.  Allergic/Immunologic: Negative for immunocompromised state.  Hematological: Does not bruise/bleed easily.  Psychiatric/Behavioral: Negative for confusion.    Allergies  Review of patient's allergies indicates no known allergies.  Home Medications   Current Outpatient Rx  Name  Route  Sig  Dispense  Refill  . lisinopril (PRINIVIL,ZESTRIL) 20 MG tablet   Oral   Take 20 mg by mouth daily.         . methocarbamol (ROBAXIN) 500 MG tablet   Oral   Take 1 tablet (500 mg total) by mouth every  6 (six) hours as needed for muscle spasms.   60 tablet   1   . Tamsulosin HCl (FLOMAX) 0.4 MG CAPS   Oral   Take 0.4 mg by mouth daily before breakfast.          . verapamil (CALAN-SR) 240 MG CR tablet   Oral   Take 240 mg by mouth daily before breakfast.          . vitamin C (ASCORBIC ACID) 250 MG tablet   Oral   Take 250 mg by mouth daily.         Marland Kitchen zolpidem (AMBIEN) 5 MG tablet   Oral   Take 2.5 mg by mouth at bedtime as needed for sleep.          Marland Kitchen aspirin EC 325 MG EC tablet   Oral   Take 1 tablet (325 mg total) by mouth daily with breakfast.   30 tablet   0     Please take for 4 weeks for blood clot prevention   . b complex vitamins tablet   Oral   Take  1 tablet by mouth daily.         . Calcium-Magnesium-Vitamin D [phone] MG-MG-UNIT TABS   Oral   Take 1 tablet by mouth daily.         . cholecalciferol (VITAMIN D) 1000 UNITS tablet   Oral   Take 1,000 Units by mouth daily.         Marland Kitchen co-enzyme Q-10 30 MG capsule   Oral   Take 30 mg by mouth daily.         . diphenhydramine-acetaminophen (TYLENOL PM) 25-500 MG TABS   Oral   Take 2 tablets by mouth at bedtime as needed (sleep/pain).          . famotidine-calcium carbonate-magnesium hydroxide (PEPCID COMPLETE) 10-800-165 MG CHEW chewable tablet   Oral   Chew 1 tablet by mouth daily as needed (heartburn).          . Garlic 600 MG CAPS   Oral   Take 600 mg by mouth daily.         . Glucosamine Sulfate 500 MG CAPS   Oral   Take 500 mg by mouth daily.         . Multiple Vitamins-Minerals (MULTIVITAMIN WITH MINERALS) tablet   Oral   Take 1 tablet by mouth daily.         . Naproxen Sodium (ALEVE) 220 MG CAPS   Oral   Take 440 mg by mouth daily as needed (pain).         Marland Kitchen OVER THE COUNTER MEDICATION      Lecithin 20 mg with 30 i.u. Vitamin E         . OVER THE COUNTER MEDICATION   Oral   Take 1 tablet by mouth daily. Glucose health         . oxyCODONE-acetaminophen (PERCOCET) 10-325 MG per tablet   Oral   Take 1-2 tablets by mouth every 4 (four) hours as needed. For pain         . Oxymetazoline HCl (NASAL SPRAY NA)   Each Nare   Place 1 spray into both nostrils at bedtime as needed (congestion).         . tadalafil (CIALIS) 5 MG tablet   Oral   Take 2.5 mg by mouth daily after supper.           BP 111/54  Pulse 85  Temp(Src)  97.8 F (36.6 C)  Resp 20  SpO2 99% Physical Exam  Nursing note and vitals reviewed. Constitutional: He is oriented to person, place, and time. He appears well-developed.  HENT:  Head: Normocephalic and atraumatic.  Eyes: Conjunctivae and EOM are normal. Pupils are equal, round, and reactive to light.   Neck: Normal range of motion. Neck supple.  Cardiovascular: Normal rate and regular rhythm.   Pulmonary/Chest: Effort normal and breath sounds normal.  Abdominal: Soft. Bowel sounds are normal. He exhibits no distension. There is no tenderness. There is no rebound and no guarding.  Musculoskeletal:  Pt has unilateral knee swelling - left with effusion. + rubor, calor and dolor. Pt has some serosanguinous discharge.   Neurological: He is alert and oriented to person, place, and time.  Skin: Skin is warm.    ED Course  Procedures (including critical care time) Labs Review Labs Reviewed  WOUND CULTURE  CBC WITH DIFFERENTIAL  BASIC METABOLIC PANEL  URINALYSIS, ROUTINE W REFLEX MICROSCOPIC   Imaging Review No results found.  EKG Interpretation   None       MDM  No diagnosis found.  Pt comes in with cc of knee swelling and pain. Post instrumentation on 12/10. Pt already received iv vanc and imaging done at [facility]. Will get labs for our team here. NPO for now, Dr. Eulah Pont paged.  Derwood Kaplan, MD 10/17/13 1401

## 2013-10-17 NOTE — Progress Notes (Signed)
Orthopedic Tech Progress Note Patient Details:  Marc Maldonado 05/06/1946 [id number]  Patient ID: Marc Maldonado, male   DOB: 1946/08/30, 67 y.o.   MRN: [id number] Trapeze bar patient helper  Nikki Dom 10/17/2013, 6:18 PM

## 2013-10-17 NOTE — H&P (Signed)
ORTHOPAEDIC H&P  REQUESTING PHYSICIAN: Sheral Apley, MD  Chief Complaint: Infected Left TKA  HPI: Marc Maldonado is a 67 y.o. male who complains of  Pain and increased drainage No systemic symptoms  Past Medical History  Diagnosis Date  . BPH (benign prostatic hyperplasia)   . Wears glasses   . Hypertension   . GERD (gastroesophageal reflux disease)     occas. indigestion   . Chronic kidney disease     renal stone-(2) tumors removed fr. bladder  . 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 Arthritis     low back & neck,- DDD  . DJD (degenerative joint disease)    Past Surgical History  Procedure Laterality Date  . Total knee arthroplasty  2008    rt  . Total knee arthroplasty  2003    left  . Tonsillectomy    . Carpal tunnel release  2012    rt and lt  . Hernia repair  2007    umb hr  . Cystoscopy with biopsy      bladder tumor  . Colonoscopy    . Shoulder arthroscopy w/ rotator cuff repair  2010 & 2013    bilateral   . Total knee revision Right 07/24/2013    Procedure: TOTAL KNEE REVISION;  Surgeon: Loreta Ave, MD;  Location: [facility] OR;  Service: Orthopedics;  Laterality: Right;  . Total knee revision Left 10/02/2013    Procedure: TOTAL KNEE REVISION, tibial component;  Surgeon: Loreta Ave, MD;  Location: [facility] OR;  Service: Orthopedics;  Laterality: Left;   History   Social History  . Marital Status: Married    Spouse Name: N/A    Number of Children: N/A  . Years of Education: N/A   Social History Main Topics  . Smoking status: Never Smoker   . Smokeless tobacco: Never Used  . Alcohol Use: No  . Drug Use: No  . Sexual Activity: None   Other Topics Concern  . None   Social History Narrative  . None   History reviewed. No pertinent family history. No Known Allergies Prior to Admission medications   Medication Sig Start Date End Date Taking? Authorizing Provider  aspirin EC 325 MG EC  tablet Take 1 tablet (325 mg total) by mouth daily with breakfast. 10/03/13  Yes M. Odelia Gage, PA-C  b complex vitamins tablet Take 1 tablet by mouth daily.   Yes Historical Provider, MD  Calcium-Magnesium-Vitamin D [phone] MG-MG-UNIT TABS Take 1 tablet by mouth daily.   Yes Historical Provider, MD  cholecalciferol (VITAMIN D) 1000 UNITS tablet Take 1,000 Units by mouth daily.   Yes Historical Provider, MD  co-enzyme Q-10 30 MG capsule Take 30 mg by mouth daily.   Yes Historical Provider, MD  diphenhydramine-acetaminophen (TYLENOL PM) 25-500 MG TABS Take 2 tablets by mouth at bedtime as needed (sleep/pain).    Yes Historical Provider, MD  famotidine-calcium carbonate-magnesium hydroxide (PEPCID COMPLETE) 10-800-165 MG CHEW chewable tablet Chew 1 tablet by mouth daily as needed (heartburn).    Yes Historical Provider, MD  Garlic 600 MG CAPS Take 600 mg by mouth daily.   Yes Historical Provider, MD  Glucosamine Sulfate 500 MG CAPS Take 500 mg by mouth daily.   Yes Historical Provider, MD  lisinopril (PRINIVIL,ZESTRIL) 20 MG tablet Take 20 mg by mouth daily.   Yes Historical Provider, MD  methocarbamol (ROBAXIN) 500 MG tablet  Take 1 tablet (500 mg total) by mouth every 6 (six) hours as needed for muscle spasms. 10/03/13  Yes M. Odelia Gage, PA-C  Multiple Vitamins-Minerals (MULTIVITAMIN WITH MINERALS) tablet Take 1 tablet by mouth daily.   Yes Historical Provider, MD  Naproxen Sodium (ALEVE) 220 MG CAPS Take 440 mg by mouth daily as needed (pain).   Yes Historical Provider, MD  OVER THE COUNTER MEDICATION Lecithin 20 mg with 30 i.u. Vitamin E   Yes Historical Provider, MD  OVER THE COUNTER MEDICATION Take 1 tablet by mouth daily. Glucose health   Yes Historical Provider, MD  oxyCODONE-acetaminophen (PERCOCET) 10-325 MG per tablet Take 1-2 tablets by mouth every 4 (four) hours as needed. For pain 07/25/13  Yes Historical Provider, MD  Oxymetazoline HCl (NASAL SPRAY NA) Place 1 spray into both  nostrils at bedtime as needed (congestion).   Yes Historical Provider, MD  tadalafil (CIALIS) 5 MG tablet Take 2.5 mg by mouth daily after supper.    Yes Historical Provider, MD  Tamsulosin HCl (FLOMAX) 0.4 MG CAPS Take 0.4 mg by mouth daily before breakfast.    Yes Historical Provider, MD  verapamil (CALAN-SR) 240 MG CR tablet Take 240 mg by mouth daily before breakfast.    Yes Historical Provider, MD  vitamin C (ASCORBIC ACID) 250 MG tablet Take 250 mg by mouth daily.   Yes Historical Provider, MD  zolpidem (AMBIEN) 5 MG tablet Take 2.5 mg by mouth at bedtime as needed for sleep.    Yes Historical Provider, MD   No results found.  Positive ROS: All other systems have been reviewed and were otherwise negative with the exception of those mentioned in the HPI and as above.  Labs cbc  Recent Labs  10/17/13 1409 10/17/13 1800  WBC 19.3* 17.4*  HGB 9.8* 9.4*  HCT 29.6* 28.3*  PLT 177 175    Labs inflam No results found for this basename: ESR, CRP,  in the last 72 hours  Labs coag  Recent Labs  10/17/13 1800  INR 1.92*     Recent Labs  10/17/13 1409 10/17/13 1800  NA 135 135  K 4.4 5.2*  CL 101 100  CO2 23 26  GLUCOSE 94 102*  BUN 31* 34*  CREATININE 1.65* 1.89*  CALCIUM 8.3* 8.4    Physical Exam: Filed Vitals:   10/17/13 1842  BP: 146/59  Pulse:   Temp:   Resp:    General: Alert, no acute distress Cardiovascular: No pedal edema Respiratory: No cyanosis, no use of accessory musculature GI: No organomegaly, abdomen is soft and non-tender Skin: No lesions in the area of chief complaint Neurologic: Sensation intact distally Psychiatric: Patient is competent for consent with normal mood and affect Lymphatic: No axillary or cervical lymphadenopathy  MUSCULOSKELETAL:  Mild erythema, serous drainage. Distally NVI Other extremities are atraumatic with painless ROM and NVI.  Assessment: Infected wound L tka  Plan: Plan for I&D L knee +/- poly  exchange Weight Bearing Status: WBAT PT VTE px: SCD's and ASA post op ID consult   Margarita Rana, D, MD Cell [phone]   10/17/2013 8:23 PM

## 2013-10-17 NOTE — ED Notes (Addendum)
Pt presents to department via [facility] from [facility] for evaluation of L knee pain and swelling. Recent L knee prosthesis surgery, now states knee is red, warm and painful to touch, yellow/bloody drainage noted at incision site. 20g RAC. Pt is alert and oriented x4 upon arrival.

## 2013-10-18 ENCOUNTER — Inpatient Hospital Stay (HOSPITAL_COMMUNITY): Payer: Medicare Other | Admitting: Anesthesiology

## 2013-10-18 ENCOUNTER — Encounter (HOSPITAL_COMMUNITY): Payer: Self-pay | Admitting: Anesthesiology

## 2013-10-18 ENCOUNTER — Encounter (HOSPITAL_COMMUNITY)
Admission: EM | Disposition: A | Discharge status: Home / Self Care | Payer: Self-pay | Source: Home / Self Care | Attending: Orthopedic Surgery

## 2013-10-18 ENCOUNTER — Encounter (HOSPITAL_COMMUNITY): Payer: Medicare Other | Admitting: Anesthesiology

## 2013-10-18 ENCOUNTER — Ambulatory Visit: Admit: 2013-10-18 | Payer: Self-pay | Admitting: Orthopedic Surgery

## 2013-10-18 DIAGNOSIS — T8450XA Infection and inflammatory reaction due to unspecified internal joint prosthesis, initial encounter: Principal | ICD-10-CM

## 2013-10-18 DIAGNOSIS — B958 Unspecified staphylococcus as the cause of diseases classified elsewhere: Secondary | ICD-10-CM

## 2013-10-18 DIAGNOSIS — R739 Hyperglycemia, unspecified: Secondary | ICD-10-CM | POA: Diagnosis present

## 2013-10-18 DIAGNOSIS — D649 Anemia, unspecified: Secondary | ICD-10-CM | POA: Diagnosis present

## 2013-10-18 DIAGNOSIS — R7881 Bacteremia: Secondary | ICD-10-CM | POA: Diagnosis present

## 2013-10-18 DIAGNOSIS — N289 Disorder of kidney and ureter, unspecified: Secondary | ICD-10-CM | POA: Diagnosis present

## 2013-10-18 HISTORY — PX: TOTAL KNEE REVISION: SHX996

## 2013-10-18 HISTORY — PX: I & D KNEE WITH POLY EXCHANGE: SHX5024

## 2013-10-18 LAB — URINE CULTURE

## 2013-10-18 SURGERY — IRRIGATION AND DEBRIDEMENT KNEE WITH POLY EXCHANGE
Anesthesia: General | Site: Knee | Laterality: Left

## 2013-10-18 MED ORDER — VANCOMYCIN HCL 10 G IV SOLR
1500.0000 mg | INTRAVENOUS | Status: DC
  Administered 2013-10-19: 1500 mg via INTRAVENOUS
  Filled 2013-10-18 (×2): qty 1500

## 2013-10-18 MED ORDER — ASPIRIN EC 325 MG PO TBEC: 325.0000 mg | DELAYED_RELEASE_TABLET | Freq: Every day | ORAL | Status: DC

## 2013-10-18 MED ORDER — CEFAZOLIN SODIUM-DEXTROSE 2-3 GM-% IV SOLR
INTRAVENOUS | Status: AC
  Filled 2013-10-18: qty 50

## 2013-10-18 MED ORDER — PROPOFOL 10 MG/ML IV BOLUS
INTRAVENOUS | Status: DC | PRN
  Administered 2013-10-18: 180 mg via INTRAVENOUS

## 2013-10-18 MED ORDER — CEFAZOLIN SODIUM 1-5 GM-% IV SOLN
INTRAVENOUS | Status: AC
  Filled 2013-10-18: qty 50

## 2013-10-18 MED ORDER — ASPIRIN EC 325 MG PO TBEC
325.0000 mg | DELAYED_RELEASE_TABLET | Freq: Every day | ORAL | Status: DC
  Administered 2013-10-19 – 2013-10-22 (×4): 325 mg via ORAL
  Filled 2013-10-18 (×6): qty 1

## 2013-10-18 MED ORDER — SODIUM CHLORIDE 0.9 % IV SOLN
INTRAVENOUS | Status: DC | PRN
  Administered 2013-10-18 (×2): via INTRAVENOUS

## 2013-10-18 MED ORDER — GLYCOPYRROLATE 0.2 MG/ML IJ SOLN
INTRAMUSCULAR | Status: DC | PRN
  Administered 2013-10-18: 0.4 mg via INTRAVENOUS

## 2013-10-18 MED ORDER — DEXTROSE-NACL 5-0.45 % IV SOLN: INTRAVENOUS | Status: AC

## 2013-10-18 MED ORDER — ACETAMINOPHEN 650 MG RE SUPP: 650.0000 mg | Freq: Four times a day (QID) | RECTAL | Status: DC | PRN

## 2013-10-18 MED ORDER — SODIUM CHLORIDE 0.9 % IR SOLN
Status: DC | PRN
  Administered 2013-10-18: 3000 mL
  Administered 2013-10-18: 1000 mL
  Administered 2013-10-18: 3000 mL

## 2013-10-18 MED ORDER — HYDROMORPHONE HCL PF 1 MG/ML IJ SOLN
INTRAMUSCULAR | Status: AC
  Filled 2013-10-18: qty 1

## 2013-10-18 MED ORDER — LIDOCAINE HCL (CARDIAC) 20 MG/ML IV SOLN
INTRAVENOUS | Status: DC | PRN
  Administered 2013-10-18: 80 mg via INTRAVENOUS

## 2013-10-18 MED ORDER — MIDAZOLAM HCL 5 MG/5ML IJ SOLN
INTRAMUSCULAR | Status: DC | PRN
  Administered 2013-10-18: 2 mg via INTRAVENOUS

## 2013-10-18 MED ORDER — ROCURONIUM BROMIDE 100 MG/10ML IV SOLN
INTRAVENOUS | Status: DC | PRN
  Administered 2013-10-18: 50 mg via INTRAVENOUS

## 2013-10-18 MED ORDER — PHENYLEPHRINE HCL 10 MG/ML IJ SOLN
INTRAMUSCULAR | Status: DC | PRN
  Administered 2013-10-18 (×5): 80 ug via INTRAVENOUS

## 2013-10-18 MED ORDER — METOCLOPRAMIDE HCL 10 MG PO TABS: 5.0000 mg | ORAL_TABLET | Freq: Three times a day (TID) | ORAL | Status: DC | PRN

## 2013-10-18 MED ORDER — VANCOMYCIN HCL 10 G IV SOLR
2500.0000 mg | Freq: Once | INTRAVENOUS | Status: AC
  Administered 2013-10-18: 2500 mg via INTRAVENOUS
  Filled 2013-10-18: qty 2500

## 2013-10-18 MED ORDER — RIFAMPIN 300 MG PO CAPS
600.0000 mg | ORAL_CAPSULE | Freq: Every day | ORAL | Status: DC
  Administered 2013-10-18 – 2013-10-22 (×5): 600 mg via ORAL
  Filled 2013-10-18 (×5): qty 2

## 2013-10-18 MED ORDER — ACETAMINOPHEN 325 MG PO TABS
650.0000 mg | ORAL_TABLET | Freq: Four times a day (QID) | ORAL | Status: DC | PRN
  Administered 2013-10-21: 650 mg via ORAL
  Filled 2013-10-18: qty 2

## 2013-10-18 MED ORDER — NEOSTIGMINE METHYLSULFATE 1 MG/ML IJ SOLN
INTRAMUSCULAR | Status: DC | PRN
  Administered 2013-10-18: 3 mg via INTRAVENOUS

## 2013-10-18 MED ORDER — MENTHOL 3 MG MT LOZG: 1.0000 | LOZENGE | OROMUCOSAL | Status: DC | PRN

## 2013-10-18 MED ORDER — ONDANSETRON HCL 4 MG/2ML IJ SOLN
INTRAMUSCULAR | Status: DC | PRN
  Administered 2013-10-18: 4 mg via INTRAVENOUS

## 2013-10-18 MED ORDER — METOCLOPRAMIDE HCL 5 MG/ML IJ SOLN
5.0000 mg | Freq: Three times a day (TID) | INTRAMUSCULAR | Status: DC | PRN
  Administered 2013-10-19 – 2013-10-22 (×3): 10 mg via INTRAVENOUS
  Filled 2013-10-18 (×3): qty 2

## 2013-10-18 MED ORDER — OXYCODONE HCL 5 MG/5ML PO SOLN: 5.0000 mg | Freq: Once | ORAL | Status: DC | PRN

## 2013-10-18 MED ORDER — ZOLPIDEM TARTRATE 5 MG PO TABS
5.0000 mg | ORAL_TABLET | Freq: Every evening | ORAL | Status: DC | PRN
  Administered 2013-10-19 – 2013-10-21 (×3): 5 mg via ORAL
  Filled 2013-10-18 (×3): qty 1

## 2013-10-18 MED ORDER — MIDAZOLAM HCL 2 MG/2ML IJ SOLN: 1.0000 mg | INTRAMUSCULAR | Status: DC | PRN

## 2013-10-18 MED ORDER — OXYCODONE HCL 5 MG PO TABS: 10.0000 mg | ORAL_TABLET | ORAL | Status: DC | PRN

## 2013-10-18 MED ORDER — DOCUSATE SODIUM 100 MG PO CAPS: 100.0000 mg | ORAL_CAPSULE | Freq: Two times a day (BID) | ORAL | Status: DC

## 2013-10-18 MED ORDER — PHENOL 1.4 % MT LIQD: 1.0000 | OROMUCOSAL | Status: DC | PRN

## 2013-10-18 MED ORDER — FENTANYL CITRATE 0.05 MG/ML IJ SOLN
INTRAMUSCULAR | Status: DC | PRN
  Administered 2013-10-18: 50 ug via INTRAVENOUS
  Administered 2013-10-18: 100 ug via INTRAVENOUS
  Administered 2013-10-18 (×2): 50 ug via INTRAVENOUS

## 2013-10-18 MED ORDER — PROMETHAZINE HCL 25 MG/ML IJ SOLN: 6.2500 mg | INTRAMUSCULAR | Status: DC | PRN

## 2013-10-18 MED ORDER — FENTANYL CITRATE 0.05 MG/ML IJ SOLN: 50.0000 ug | Freq: Once | INTRAMUSCULAR | Status: DC

## 2013-10-18 MED ORDER — OXYCODONE HCL 5 MG PO TABS: 5.0000 mg | ORAL_TABLET | Freq: Once | ORAL | Status: DC | PRN

## 2013-10-18 MED ORDER — HYDROMORPHONE HCL PF 1 MG/ML IJ SOLN
0.2500 mg | INTRAMUSCULAR | Status: DC | PRN
  Administered 2013-10-18 (×2): 0.5 mg via INTRAVENOUS

## 2013-10-18 SURGICAL SUPPLY — 85 items
BANDAGE ELASTIC 6 VELCRO ST LF (GAUZE/BANDAGES/DRESSINGS) ×2 IMPLANT
BANDAGE GAUZE ELAST BULKY 4 IN (GAUZE/BANDAGES/DRESSINGS) ×3 IMPLANT
BLADE GREAT WHITE 4.2 (BLADE) IMPLANT
BLADE SURG 15 STRL LF DISP TIS (BLADE) IMPLANT
BLADE SURG 15 STRL SS (BLADE)
BNDG COHESIVE 4X5 TAN STRL (GAUZE/BANDAGES/DRESSINGS) ×3 IMPLANT
BNDG COHESIVE 6X5 TAN STRL LF (GAUZE/BANDAGES/DRESSINGS) ×3 IMPLANT
BRUSH SCRUB DISP (MISCELLANEOUS) ×3 IMPLANT
BUR 3.5 LG SPHERICAL (BURR) IMPLANT
BUR CUDA 2.9 (BURR) ×3 IMPLANT
BUR GATOR 2.9 (BURR) ×3 IMPLANT
BUR OVAL 4.0 (BURR) IMPLANT
BUR SPHERICAL 2.9 (BURR) ×3 IMPLANT
BURR 3.5 LG SPHERICAL (BURR)
CLOTH BEACON ORANGE TIMEOUT ST (SAFETY) ×3 IMPLANT
CONT SPEC 4OZ CLIKSEAL STRL BL (MISCELLANEOUS) ×4 IMPLANT
COTTON STERILE ROLL (GAUZE/BANDAGES/DRESSINGS) IMPLANT
CUFF TOURNIQUET SINGLE 34IN LL (TOURNIQUET CUFF) IMPLANT
CUFF TOURNIQUET SINGLE 44IN (TOURNIQUET CUFF) IMPLANT
DECANTER SPIKE VIAL GLASS SM (MISCELLANEOUS) IMPLANT
DRAPE ARTHROSCOPY W/POUCH 114 (DRAPES) ×3 IMPLANT
DRAPE OEC MINIVIEW 54X84 (DRAPES) ×3 IMPLANT
DRAPE ORTHO SPLIT 77X108 STRL (DRAPES) ×6
DRAPE SURG 17X23 STRL (DRAPES) ×3 IMPLANT
DRAPE SURG ORHT 6 SPLT 77X108 (DRAPES) ×2 IMPLANT
DRAPE U-SHAPE 47X51 STRL (DRAPES) ×3 IMPLANT
DRSG ADAPTIC 3X8 NADH LF (GAUZE/BANDAGES/DRESSINGS) ×2 IMPLANT
ELECT REM PT RETURN 9FT ADLT (ELECTROSURGICAL) ×3
ELECTRODE REM PT RTRN 9FT ADLT (ELECTROSURGICAL) ×2 IMPLANT
EVACUATOR 1/8 PVC DRAIN (DRAIN) ×2 IMPLANT
GAUZE XEROFORM 1X8 LF (GAUZE/BANDAGES/DRESSINGS) ×3 IMPLANT
GLOVE BIO SURGEON STRL SZ7.5 (GLOVE) ×3 IMPLANT
GLOVE BIOGEL PI IND STRL 6 (GLOVE) ×2 IMPLANT
GLOVE BIOGEL PI IND STRL 6.5 (GLOVE) ×1 IMPLANT
GLOVE BIOGEL PI IND STRL 7.0 (GLOVE) ×1 IMPLANT
GLOVE BIOGEL PI IND STRL 8 (GLOVE) ×4 IMPLANT
GLOVE BIOGEL PI INDICATOR 6 (GLOVE) ×2
GLOVE BIOGEL PI INDICATOR 6.5 (GLOVE) ×1
GLOVE BIOGEL PI INDICATOR 7.0 (GLOVE) ×1
GLOVE BIOGEL PI INDICATOR 8 (GLOVE) ×2
GOWN PREVENTION PLUS XLARGE (GOWN DISPOSABLE) ×3 IMPLANT
GOWN STRL NON-REIN LRG LVL3 (GOWN DISPOSABLE) ×5 IMPLANT
HANDPIECE INTERPULSE COAX TIP (DISPOSABLE) ×3
IMMOBILIZER KNEE 22  40 CIR (ORTHOPEDIC SUPPLIES) ×1
IMMOBILIZER KNEE 22 40 CIR (ORTHOPEDIC SUPPLIES) ×1 IMPLANT
INSERT TIBIA SCORPIO FLX 11X18 (Orthopedic Implant) ×2 IMPLANT
IV NS IRRIG 3000ML ARTHROMATIC (IV SOLUTION) ×4 IMPLANT
KIT BASIN OR (CUSTOM PROCEDURE TRAY) ×3 IMPLANT
KIT ROOM TURNOVER OR (KITS) ×3 IMPLANT
MANIFOLD NEPTUNE II (INSTRUMENTS) ×3 IMPLANT
NDL HYPO 25X1 1.5 SAFETY (NEEDLE) IMPLANT
NEEDLE HYPO 25X1 1.5 SAFETY (NEEDLE) IMPLANT
NS IRRIG 1000ML POUR BTL (IV SOLUTION) ×3 IMPLANT
PACK ARTHROSCOPY DSU (CUSTOM PROCEDURE TRAY) ×3 IMPLANT
PAD ABD 8X10 STRL (GAUZE/BANDAGES/DRESSINGS) ×2 IMPLANT
PAD ARMBOARD 7.5X6 YLW CONV (MISCELLANEOUS) ×6 IMPLANT
PADDING CAST ABS 4INX4YD NS (CAST SUPPLIES) ×1
PADDING CAST ABS COTTON 4X4 ST (CAST SUPPLIES) ×2 IMPLANT
PENCIL BUTTON HOLSTER BLD 10FT (ELECTRODE) IMPLANT
SET ARTHROSCOPY TUBING (MISCELLANEOUS) ×3
SET ARTHROSCOPY TUBING LN (MISCELLANEOUS) ×2 IMPLANT
SET HNDPC FAN SPRY TIP SCT (DISPOSABLE) ×1 IMPLANT
SPONGE GAUZE 4X4 12PLY (GAUZE/BANDAGES/DRESSINGS) ×5 IMPLANT
SPONGE LAP 18X18 X RAY DECT (DISPOSABLE) ×4 IMPLANT
STOCKINETTE 6  STRL (DRAPES) ×1
STOCKINETTE 6 STRL (DRAPES) ×2 IMPLANT
STRAP ANKLE FOOT DISTRACTOR (ORTHOPEDIC SUPPLIES) ×3 IMPLANT
SUCTION FRAZIER TIP 10 FR DISP (SUCTIONS) IMPLANT
SUT ETHILON 2 0 FS 18 (SUTURE) ×4 IMPLANT
SUT ETHILON 2 0 PSLX (SUTURE) ×4 IMPLANT
SUT ETHILON 4 0 PS 2 18 (SUTURE) ×3 IMPLANT
SUT MNCRL AB 4-0 PS2 18 (SUTURE) ×3 IMPLANT
SUT MON AB 2-0 CT1 27 (SUTURE) ×3 IMPLANT
SUT PDS AB 1 CT  36 (SUTURE) ×4
SUT PDS AB 1 CT 36 (SUTURE) ×4 IMPLANT
SUT VIC AB 3-0 PS1 18 (SUTURE)
SUT VIC AB 3-0 PS1 18XBRD (SUTURE) IMPLANT
SYR BULB IRRIGATION 50ML (SYRINGE) IMPLANT
SYR CONTROL 10ML LL (SYRINGE) IMPLANT
TOWEL OR 17X24 6PK STRL BLUE (TOWEL DISPOSABLE) ×3 IMPLANT
TOWEL OR 17X26 10 PK STRL BLUE (TOWEL DISPOSABLE) ×5 IMPLANT
TUBE CONNECTING 12X1/4 (SUCTIONS) ×3 IMPLANT
UNDERPAD 30X30 INCONTINENT (UNDERPADS AND DIAPERS) ×3 IMPLANT
WAND 90 DEG TURBOVAC W/CORD (SURGICAL WAND) ×3 IMPLANT
WATER STERILE IRR 1000ML POUR (IV SOLUTION) ×3 IMPLANT

## 2013-10-18 NOTE — Anesthesia Procedure Notes (Signed)
Procedure Name: Intubation Date/Time: 10/18/2013 7:50 AM Performed by: Quentin Ore Pre-anesthesia Checklist: Patient identified, Emergency Drugs available, Suction available, Patient being monitored and Timeout performed Patient Re-evaluated:Patient Re-evaluated prior to inductionOxygen Delivery Method: Circle system utilized Preoxygenation: Pre-oxygenation with 100% oxygen Intubation Type: IV induction Ventilation: Mask ventilation without difficulty Laryngoscope Size: Mac and 3 Grade View: Grade III Tube type: Oral Tube size: 7.5 mm Number of attempts: 1 Airway Equipment and Method: Stylet Placement Confirmation: positive ETCO2 and breath sounds checked- equal and bilateral Secured at: 22 cm Tube secured with: Tape Dental Injury: Teeth and Oropharynx as per pre-operative assessment

## 2013-10-18 NOTE — Interval H&P Note (Signed)
History and Physical Interval Note:  10/18/2013 7:34 AM  Marc Maldonado  has presented today for surgery, with the diagnosis of .Marland Kitchen  The various methods of treatment have been discussed with the patient and family. After consideration of risks, benefits and other options for treatment, the patient has consented to  Procedure(s): IRRIGATION AND DEBRIDEMENT KNEE WITH POLY EXCHANGE (Left) as a surgical intervention .  The patient's history has been reviewed, patient examined, no change in status, stable for surgery.  I have reviewed the patient's chart and labs.  Questions were answered to the patient's satisfaction.     Troy Hartzog, D

## 2013-10-18 NOTE — Progress Notes (Signed)
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  No Known Allergies  Patient Measurements:   Adjusted Body Weight:   Vital Signs: Temp: 98.5 F (36.9 C) (12/26 1440) Temp src: Oral (12/26 1440) BP: 113/52 mmHg (12/26 1440) Pulse Rate: 86 (12/26 1440) Intake/Output from previous day: 12/25 0701 - 12/26 0700 In: 1155 [I.V.:1105; IV Piggyback:50] Out: 900 [Urine:900] Intake/Output from this shift: Total I/O In: 1180 [P.O.:480; I.V.:700] Out: 350 [Urine:250; Blood:100]  Labs:  Recent Labs  10/17/13 1409 10/17/13 1800  WBC 19.3* 17.4*  HGB 9.8* 9.4*  PLT 177 175  CREATININE 1.65* 1.89*   The CrCl is unknown because both a height and weight (above a minimum accepted value) are required for this calculation. No results found for this basename: VANCOTROUGH, Leodis Binet, VANCORANDOM, GENTTROUGH, GENTPEAK, GENTRANDOM, TOBRATROUGH, TOBRAPEAK, TOBRARND, AMIKACINPEAK, AMIKACINTROU, AMIKACIN,  in the last 72 hours   Microbiology: Recent Results (from the past 720 hour(s))  SURGICAL PCR SCREEN     Status: None   Collection Time    09/24/13  9:50 AM      Result Value Range Status   MRSA, PCR NEGATIVE  NEGATIVE Final   Staphylococcus aureus NEGATIVE  NEGATIVE Final   Comment:            The Xpert SA Assay (FDA     approved for NASAL specimens     in patients over 58 years of age),     is one component of     a comprehensive surveillance     program.  Test performance has     been validated by [employer] for patients greater     than or equal to 1 year old.     It is not intended     to diagnose infection nor to     guide or monitor treatment.  URINE CULTURE     Status: None   Collection Time    09/24/13  9:51 AM      Result Value Range Status   Specimen Description URINE, CLEAN CATCH   Final   Special Requests NONE   Final   Culture  Setup Time     Final   Value: 09/24/2013 10:45      Performed at [facility] Count     Final   Value: NO GROWTH     Performed at [facility]   Culture     Final   Value: NO GROWTH     Performed at [facility]   Report Status 09/25/2013 FINAL   Final  WOUND CULTURE     Status: None   Collection Time    10/17/13  1:56 PM      Result Value Range Status   Specimen Description WOUND KNEE LEFT   Final   Special Requests NONE   Final   Gram Stain     Final   Value: RARE WBC PRESENT, PREDOMINANTLY PMN     NO SQUAMOUS EPITHELIAL CELLS SEEN     RARE GRAM POSITIVE COCCI     IN PAIRS     Performed at [facility]   Culture     Final   Value: Culture reincubated for better growth     Performed at [facility]   Report Status PENDING   Incomplete    Medical History: Past Medical History  Diagnosis Date  . BPH (benign  prostatic hyperplasia)   . Wears glasses   . Hypertension   . GERD (gastroesophageal reflux disease)     occas. indigestion   . Chronic kidney disease     renal stone-(2) tumors removed fr. bladder  . 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 Arthritis     low back & neck,- DDD  . DJD (degenerative joint disease)     Medications:  Prescriptions prior to admission  Medication Sig Dispense Refill  . aspirin EC 325 MG EC tablet Take 1 tablet (325 mg total) by mouth daily with breakfast.  30 tablet  0  . b complex vitamins tablet Take 1 tablet by mouth daily.      . Calcium-Magnesium-Vitamin D [phone] MG-MG-UNIT TABS Take 1 tablet by mouth daily.      . cholecalciferol (VITAMIN D) 1000 UNITS tablet Take 1,000 Units by mouth daily.      Marland Kitchen co-enzyme Q-10 30 MG capsule Take 30 mg by mouth daily.      . diphenhydramine-acetaminophen (TYLENOL PM) 25-500 MG TABS Take 2 tablets by mouth at bedtime as needed (sleep/pain).       . famotidine-calcium carbonate-magnesium hydroxide (PEPCID COMPLETE) 10-800-165 MG CHEW chewable  tablet Chew 1 tablet by mouth daily as needed (heartburn).       . Garlic 600 MG CAPS Take 600 mg by mouth daily.      . Glucosamine Sulfate 500 MG CAPS Take 500 mg by mouth daily.      Marland Kitchen lisinopril (PRINIVIL,ZESTRIL) 20 MG tablet Take 20 mg by mouth daily.      . methocarbamol (ROBAXIN) 500 MG tablet Take 1 tablet (500 mg total) by mouth every 6 (six) hours as needed for muscle spasms.  60 tablet  1  . Multiple Vitamins-Minerals (MULTIVITAMIN WITH MINERALS) tablet Take 1 tablet by mouth daily.      . Naproxen Sodium (ALEVE) 220 MG CAPS Take 440 mg by mouth daily as needed (pain).      Marland Kitchen OVER THE COUNTER MEDICATION Lecithin 20 mg with 30 i.u. Vitamin E      . OVER THE COUNTER MEDICATION Take 1 tablet by mouth daily. Glucose health      . oxyCODONE-acetaminophen (PERCOCET) 10-325 MG per tablet Take 1-2 tablets by mouth every 4 (four) hours as needed. For pain      . Oxymetazoline HCl (NASAL SPRAY NA) Place 1 spray into both nostrils at bedtime as needed (congestion).      . tadalafil (CIALIS) 5 MG tablet Take 2.5 mg by mouth daily after supper.       . Tamsulosin HCl (FLOMAX) 0.4 MG CAPS Take 0.4 mg by mouth daily before breakfast.       . verapamil (CALAN-SR) 240 MG CR tablet Take 240 mg by mouth daily before breakfast.       . vitamin C (ASCORBIC ACID) 250 MG tablet Take 250 mg by mouth daily.      Marland Kitchen zolpidem (AMBIEN) 5 MG tablet Take 2.5 mg by mouth at bedtime as needed for sleep.        Assessment: L knee pain and swelling x 2d  67 y/o M with recent L knee prosthesis surgery 12/10, transferred from [facility], presents with knee redness, warmth, and pain with yellow/bloody drainage at incision site. Labs from [place] with [facility] from blood cultures.   Labs/Vitals:Tmax 101.1. WBC 17.4. Scr 1.89 with estimated CrCl 48  Goal of  Therapy:  Vancomycin trough level 15-20 mcg/ml  Plan:  Plan for I&D L knee +/- poly exchange 12/26 Continue Ancef per ID recommendations at 1g IV q8hr. Rifampin 600mg   daily Vanco 2500mg  IV load then 1500mg  IV q24h Vanco trough after 3-5 doses at steady state.     Merilynn Finland, [employer] 10/18/2013,2:57 PM

## 2013-10-18 NOTE — Anesthesia Preprocedure Evaluation (Addendum)
Anesthesia Evaluation  Patient identified by MRN, date of birth, ID band Patient awake    Reviewed: Allergy & Precautions, H&P , NPO status , Patient's Chart, lab work & pertinent test results  Airway Mallampati: II TM Distance: >3 FB Neck ROM: Full    Dental  (+) Teeth Intact   Pulmonary  + rhonchi         Cardiovascular hypertension, Rhythm:Regular Rate:Normal     Neuro/Psych    GI/Hepatic GERD-  ,  Endo/Other    Renal/GU Renal InsufficiencyRenal disease     Musculoskeletal   Abdominal (+) + obese,   Peds  Hematology   Anesthesia Other Findings septic  Reproductive/Obstetrics                          Anesthesia Physical Anesthesia Plan  ASA: III  Anesthesia Plan: General   Post-op Pain Management:    Induction: Intravenous  Airway Management Planned: Oral ETT  Additional Equipment:   Intra-op Plan:   Post-operative Plan: Extubation in OR  Informed Consent: I have reviewed the patients History and Physical, chart, labs and discussed the procedure including the risks, benefits and alternatives for the proposed anesthesia with the patient or authorized representative who has indicated his/her understanding and acceptance.     Plan Discussed with: CRNA and Surgeon  Anesthesia Plan Comments:         Anesthesia Quick Evaluation

## 2013-10-18 NOTE — Op Note (Signed)
10/17/2013 - 10/18/2013  9:46 AM  PATIENT:  Marc Maldonado    PRE-OPERATIVE DIAGNOSIS:  infected LEFT knee.Marland Kitchen  POST-OPERATIVE DIAGNOSIS:  Same  PROCEDURE:  IRRIGATION AND DEBRIDEMENT KNEE WITH POLY EXCHANGE  SURGEON:  Jamy Whyte, D, MD  ASSISTANT: Richardson Landry MD  ANESTHESIA:   Gen  PREOPERATIVE INDICATIONS:  [name] is a  67 y.o. male with a diagnosis of infected LEFT knee.. who failed conservative measures and elected for surgical management.    The risks benefits and alternatives were discussed with the patient preoperatively including but not limited to the risks of infection, bleeding, nerve injury, cardiopulmonary complications, the need for revision surgery, among others, and the patient was willing to proceed.  OPERATIVE IMPLANTS: #11 poly insert scorpio flex knee    BLOOD LOSS: 100  COMPLICATIONS: none  TOURNIQUET TIME: none  OPERATIVE PROCEDURE:  Patient was identified in the preoperative holding area and site was marked by me He was transported to the operating theater and placed on the table in supine position taking care to pad all bony prominences. After a preincinduction time out anesthesia was induced. The left lower extremity was prepped and draped in normal sterile fashion and a pre-incision timeout was performed. Marc Maldonado received Ancef after culture was obtained for preoperative antibiotics.   Antibiotics were held the incision was made through the previous surgical incision me he periwound fluid was noted this collected for culture as was some of the tissue within the region there was communication through the fascia and capsule into the knee joint itself at the superior aspect of the quad repair. 3 L bag was used for irrigation and debridement of all devitalized and the purulent fluid was performed. I then incised the parapatellar tissues and performed another irrigation with 3 L as well as the debridement there was purulent fluid deep to the  joint capsule. I then removed the polyethylene liner came out hole without difficulty. I performed another irrigation with 2 L of and debridement as happy with her irrigation at this point I selected the same size 11 liner and replaced it without difficulty. Another irrigation with a liter was performed I then used a monofilament sutures to close the capsule watertight fashion after placing a Hemovac drain and closed the skin with nylon stitches as well. Sterile dressing applied he was taken the PACU in stable condition.  POST OPERATIVE PLAN: We'll obtain an ID consult for assistance with long-term antibiotics I placed him on aspirin 325 SCDs and Ted's for DVT prophylaxis.

## 2013-10-18 NOTE — Progress Notes (Signed)
Lab result from [place] received (via telephone from Eric Form).  Gram positive cocci in clusters (aerobic bottle) resulted from blood culture drawn on 12/25 at [facility].  Lab result to be faxed by [facility] and placed in chart upon receipt.

## 2013-10-18 NOTE — Transfer of Care (Signed)
Immediate Anesthesia Transfer of Care Note  Patient: Marc Maldonado  Procedure(s) Performed: Procedure(s): IRRIGATION AND DEBRIDEMENT KNEE WITH POLY EXCHANGE (Left)  Patient Location: PACU  Anesthesia Type:General  Level of Consciousness: sedated  Airway & Oxygen Therapy: Patient Spontanous Breathing and Patient connected to nasal cannula oxygen  Post-op Assessment: Report given to PACU RN and Post -op Vital signs reviewed and stable  Post vital signs: Reviewed and stable  Complications: No apparent anesthesia complications

## 2013-10-18 NOTE — Preoperative (Signed)
Beta Blockers   Reason not to administer Beta Blockers:Not Applicable 

## 2013-10-18 NOTE — Consult Note (Signed)
[facility]    Date of Admission:  10/17/2013   Total days of antibiotics 2        Day 1 cefazolin               Reason for Consult: Right prosthetic knee infection    Referring Physician: Dr. Renaye Rakers  Principal Problem:   Infection of prosthetic knee joint Active Problems:   Bacteremia due to Staphylococcus   Essential hypertension, benign   GERD (gastroesophageal reflux disease)   Failed total left knee replacement   DJD (degenerative joint disease) of knee   Normocytic anemia   Acute renal insufficiency   Hyperglycemia   . [START ON 10/19/2013] aspirin EC  325 mg Oral Q breakfast  .  ceFAZolin (ANCEF) IV  1 g Intravenous Q8H  . docusate sodium  100 mg Oral BID  . HYDROmorphone      . lisinopril  20 mg Oral Daily  . tamsulosin  0.4 mg Oral Daily  . verapamil  240 mg Oral Daily    Recommendations: 1. Continue cefazolin 2. Start vancomycin and oral rifampin pending final culture results 3. Repeat blood cultures in a.m. 4. Hold off on PICC placement until blood cultures are negative   Assessment: [name] appears to have staph bacteremia complicated by a left prosthetic knee infection. I will treat him with IV cefazolin and vancomycin to cover for the possibility of MSSA and MRSA infection and add rifampin for better antibiotic coverage given his prosthetic joint infection. He will need repeat blood cultures in the morning. I will hold off on PICC placement pending results of those cultures. If blood cultures grow staph aureus he will need echocardiography.   HPI: Marc Maldonado is a 67 y.o. male with degenerative joint disease who had left total knee arthroplasty in 2003. Recently he developed loosening of prosthesis with severe, debilitating pain. He underwent revision arthroplasty on December 10. He initially did well postoperatively but 2 days ago he developed sudden, severe pain and swelling of his left knee. He then began to  notice wound drainage and low-grade fever. His pain was so severe he could not stand and walk so EMS was called. He was taken to the [facility]. Blood cultures obtained there are growing gram-positive cocci in clusters in both sets. He was transferred here last night. Swab specimens of wound drainage show gram-positive cocci in pairs on Gram stain. He underwent incision and drainage and poly-exchange of his knee today. Operative Gram stain is pending.   Review of Systems: Constitutional: positive for fevers, negative for anorexia, chills, sweats and weight loss Eyes: negative Ears, nose, mouth, throat, and face: negative Respiratory: negative Cardiovascular: negative Gastrointestinal: negative Genitourinary:negative  Past Medical History  Diagnosis Date  . BPH (benign prostatic hyperplasia)   . Wears glasses   . Hypertension   . GERD (gastroesophageal reflux disease)     occas. indigestion   . Chronic kidney disease     renal stone-(2) tumors removed fr. bladder  . 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 Arthritis     low back & neck,- DDD  . DJD (degenerative joint disease)     History  Substance Use Topics  . Smoking status: Never Smoker   . Smokeless tobacco: Never Used  . Alcohol Use: No    History reviewed. No pertinent family  history. No Known Allergies  OBJECTIVE: Blood pressure 115/59, pulse 86, temperature 98 F (36.7 C), temperature source Oral, resp. rate 11, SpO2 93.00%. General: He is in no distress visiting with family Skin: Ecchymosis of his left foot Lungs: Clear Cor: Regular S1 and S2 with no murmurs heard Abdomen: Soft and nontender His left leg is in a soft brace  Lab Results Lab Results  Component Value Date   WBC 17.4* 10/17/2013   HGB 9.4* 10/17/2013   HCT 28.3* 10/17/2013   MCV 101.1* 10/17/2013   PLT 175 10/17/2013    Lab Results  Component Value Date   CREATININE 1.89*  10/17/2013   BUN 34* 10/17/2013   NA 135 10/17/2013   K 5.2* 10/17/2013   CL 100 10/17/2013   CO2 26 10/17/2013    Lab Results  Component Value Date   ALT 15 10/17/2013   AST 16 10/17/2013   ALKPHOS 63 10/17/2013   BILITOT 1.2 10/17/2013     Microbiology: Recent Results (from the past 240 hour(s))  WOUND CULTURE     Status: None   Collection Time    10/17/13  1:56 PM      Result Value Range Status   Specimen Description WOUND KNEE LEFT   Final   Special Requests NONE   Final   Gram Stain     Final   Value: RARE WBC PRESENT, PREDOMINANTLY PMN     NO SQUAMOUS EPITHELIAL CELLS SEEN     RARE GRAM POSITIVE COCCI     IN PAIRS     Performed at [facility]   Culture     Final   Value: Culture reincubated for better growth     Performed at [facility]   Report Status PENDING   Incomplete    Cliffton Asters, MD [facility] [phone] pager   [phone] cell 10/18/2013, 2:41 PM

## 2013-10-18 NOTE — Anesthesia Postprocedure Evaluation (Signed)
Anesthesia Post Note  Patient: Marc Maldonado  Procedure(s) Performed: Procedure(s) (LRB): IRRIGATION AND DEBRIDEMENT KNEE WITH POLY EXCHANGE (Left)  Anesthesia type: general  Patient location: PACU  Post pain: Pain level controlled  Post assessment: Patient's Cardiovascular Status Stable  Last Vitals:  Filed Vitals:   10/18/13 1015  BP: 115/59  Pulse: 86  Temp: 36.7 C  Resp:     Post vital signs: Reviewed and stable  Level of consciousness: sedated  Complications: No apparent anesthesia complications

## 2013-10-19 DIAGNOSIS — A4901 Methicillin susceptible Staphylococcus aureus infection, unspecified site: Secondary | ICD-10-CM

## 2013-10-19 LAB — CBC
Hemoglobin: 7.8 g/dL — ABNORMAL LOW (ref 13.0–17.0)
MCH: 33.3 pg (ref 26.0–34.0)
MCHC: 33.9 g/dL (ref 30.0–36.0)
Platelets: 142 10*3/uL — ABNORMAL LOW (ref 150–400)
RBC: 2.34 MIL/uL — ABNORMAL LOW (ref 4.22–5.81)
WBC: 6.7 10*3/uL (ref 4.0–10.5)

## 2013-10-19 NOTE — Progress Notes (Signed)
Patient ID: Marc Maldonado, male   DOB: July 11, 1946, 67 y.o.   MRN: [id number]         [facility]    Date of Admission:  10/17/2013   Total days of antibiotics 3         Principal Problem:   Infection of prosthetic knee joint Active Problems:   Bacteremia due to Staphylococcus   Essential hypertension, benign   GERD (gastroesophageal reflux disease)   Failed total left knee replacement   DJD (degenerative joint disease) of knee   Normocytic anemia   Acute renal insufficiency   Hyperglycemia   . aspirin EC  325 mg Oral Q breakfast  .  ceFAZolin (ANCEF) IV  1 g Intravenous Q8H  . docusate sodium  100 mg Oral BID  . lisinopril  20 mg Oral Daily  . rifampin  600 mg Oral Daily  . tamsulosin  0.4 mg Oral Daily  . vancomycin  1,500 mg Intravenous Q24H  . verapamil  240 mg Oral Daily    Subjective: He states that he is feeling much better with minimal pain.   Past Medical History  Diagnosis Date  . BPH (benign prostatic hyperplasia)   . Wears glasses   . Hypertension   . GERD (gastroesophageal reflux disease)     occas. indigestion   . Chronic kidney disease     renal stone-(2) tumors removed fr. bladder  . 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 Arthritis     low back & neck,- DDD  . DJD (degenerative joint disease)     History  Substance Use Topics  . Smoking status: Never Smoker   . Smokeless tobacco: Never Used  . Alcohol Use: No    History reviewed. No pertinent family history.  No Known Allergies  Objective: Temp:  [98.4 F (36.9 C)-98.7 F (37.1 C)] 98.4 F (36.9 C) (12/27 1336) Pulse Rate:  [70-86] 70 (12/27 1336) Resp:  [16-18] 18 (12/27 1336) BP: (107-130)/(52-60) 107/60 mmHg (12/27 1336) SpO2:  [95 %-100 %] 100 % (12/27 1336) Weight:  [113.4 kg (250 lb)] 113.4 kg (250 lb) (12/26 1440)  General: He is very groggy and drifts off to sleep during exam Skin: No split or  conjunctival hemorrhages Lungs: Clear Cor: Distant but regular S1 and S2 with no murmurs Left leg in soft brace  Lab Results Lab Results  Component Value Date   WBC 6.7 10/19/2013   HGB 7.8* 10/19/2013   HCT 23.0* 10/19/2013   MCV 98.3 10/19/2013   PLT 142* 10/19/2013    Lab Results  Component Value Date   CREATININE 1.89* 10/17/2013   BUN 34* 10/17/2013   NA 135 10/17/2013   K 5.2* 10/17/2013   CL 100 10/17/2013   CO2 26 10/17/2013    Lab Results  Component Value Date   ALT 15 10/17/2013   AST 16 10/17/2013   ALKPHOS 63 10/17/2013   BILITOT 1.2 10/17/2013      Microbiology: Recent Results (from the past 240 hour(s))  WOUND CULTURE     Status: None   Collection Time    10/17/13  1:56 PM      Result Value Range Status   Specimen Description WOUND KNEE LEFT   Final   Special Requests NONE   Final   Gram Stain     Final   Value: RARE WBC PRESENT, PREDOMINANTLY PMN  NO SQUAMOUS EPITHELIAL CELLS SEEN     RARE GRAM POSITIVE COCCI     IN PAIRS     Performed at [facility]   Culture     Final   Value: ABUNDANT STAPHYLOCOCCUS AUREUS     Note: RIFAMPIN AND GENTAMICIN SHOULD NOT BE USED AS SINGLE DRUGS FOR TREATMENT OF STAPH INFECTIONS.     Performed at [facility]   Report Status PENDING   Incomplete  URINE CULTURE     Status: None   Collection Time    10/17/13  4:16 PM      Result Value Range Status   Specimen Description URINE, CATHETERIZED   Final   Special Requests NONE   Final   Culture  Setup Time     Final   Value: 10/18/2013 00:16     Performed at [employer] [facility] Count     Final   Value: NO GROWTH     Performed at [facility]   Culture     Final   Value: NO GROWTH     Performed at [facility]   Report Status 10/18/2013 FINAL   Final  CULTURE, ROUTINE-ABSCESS     Status: None   Collection Time    10/18/13  8:10 AM      Result Value Range Status   Specimen Description ABSCESS LEFT KNEE   Final    Special Requests NONE   Final   Gram Stain     Final   Value: ABUNDANT WBC PRESENT,BOTH PMN AND MONONUCLEAR     NO SQUAMOUS EPITHELIAL CELLS SEEN     MODERATE GRAM POSITIVE COCCI IN PAIRS     Performed at [facility]   Culture     Final   Value: Culture reincubated for better growth     Performed at [facility]   Report Status PENDING   Incomplete     Assessment: Blood cultures from [facility] are growing staph aureus. He is also growing staph aureus from his left knee. I will continue his current 3 drug antibiotic regimen pending the antibiotic susceptibilities. Repeat blood cultures are negative so far. I will consider PICC placement tomorrow if blood cultures remained negative.  Plan: 1. Continue vancomycin, cefazolin and rifampin 2. Await final blood culture results 3. Await results of the transthoracic echocardiogram 4. Consider PICC placement tomorrow  Cliffton Asters, MD [facility] [phone] pager   [phone] cell 10/19/2013, 2:05 PM

## 2013-10-19 NOTE — Evaluation (Addendum)
Physical Therapy Evaluation Patient Details Name: TEVAN MARIAN MRN: [id number] DOB: 08-27-1946 Today's Date: 10/19/2013 Time: 4098-1191 PT Time Calculation (min): 25 min  PT Assessment / Plan / Recommendation History of Present Illness  ZEN CEDILLOS is a 67 y.o. male with degenerative joint disease who had left total knee arthroplasty in 2003. Recently he developed loosening of prosthesis with severe, debilitating pain. He underwent revision arthroplasty on December 10. He initially did well postoperatively but 2 days ago he developed sudden, severe pain and swelling of his left knee. He then began to notice wound drainage and low-grade fever. His pain was so severe he could not stand and walk so EMS was called. He was taken to the [facility]. Blood cultures obtained there are growing gram-positive cocci in clusters in both sets. He was transferred here last night. Swab specimens of wound drainage show gram-positive cocci in pairs on Gram stain. He underwent incision and drainage and poly-exchange of his knee today. Operative Gram stain is pending.  Clinical Impression  Pt admitted with s/p left I & D and poly exchange. Pt currently with functional limitations due to the deficits listed below (see PT Problem List). Pt lethargic during PT evaluation limiting evaluation and quickly returns to sleep after mobility.   Pt will benefit from skilled PT to increase their independence and safety with mobility to allow discharge to the venue listed below.       PT Assessment  Patient needs continued PT services    Follow Up Recommendations  Home health PT;Supervision/Assistance - 24 hour    Equipment Recommendations  Rolling walker with 5" wheels    Frequency Min 5X/week    Precautions / Restrictions Precautions Precautions: Knee Required Braces or Orthoses: Knee Immobilizer - Left Knee Immobilizer - Left: Other (comment) Restrictions Weight Bearing Restrictions: Yes LLE Weight  Bearing: Weight bearing as tolerated   Pertinent Vitals/Pain Does not report pain when asked; pt lethargic and no reports of pain      Mobility  Bed Mobility Bed Mobility: Supine to Sit;Scooting to HOB Supine to Sit: 4: Min assist;With rails;HOB elevated Sitting - Scoot to Edge of Bed: 3: Mod assist Details for Bed Mobility Assistance: (A) to elevate trunk OOB and (A) with left LE OOB.  Cues for proper technique and hand placement.  Transfers Transfers: Sit to Stand;Stand to Sit Sit to Stand: 4: Min assist;From bed Stand to Sit: 4: Min assist;To chair/3-in-1 Details for Transfer Assistance: (A) to initiate transfer with cues for hand placement Ambulation/Gait Ambulation/Gait Assistance: 4: Min assist Ambulation Distance (Feet): 5 Feet Assistive device: Rolling walker Ambulation/Gait Assistance Details: (A) to manage RW with cues for proper step sequence.  Gait Pattern: Step-to pattern;Decreased stance time - left;Decreased step length - right Gait velocity: decreased Stairs: No    Exercises Total Joint Exercises Ankle Circles/Pumps: AROM;Both;10 reps;Supine Quad Sets: AAROM;Strengthening;Both;10 reps   PT Diagnosis: Difficulty walking;Abnormality of gait;Generalized weakness;Acute pain  PT Problem List: Decreased strength;Decreased range of motion;Decreased activity tolerance;Decreased balance;Decreased mobility;Pain PT Treatment Interventions: DME instruction;Gait training;Stair training;Functional mobility training;Therapeutic exercise;Therapeutic activities;Balance training;Neuromuscular re-education;Patient/family education;Modalities     PT Goals(Current goals can be found in the care plan section) Acute Rehab PT Goals Patient Stated Goal: Home w/ spouse assist PT Goal Formulation: With patient/family Time For Goal Achievement: 10/26/13 Potential to Achieve Goals: Good  Visit Information  Last PT Received On: 10/19/13 History of Present Illness: JANCARLOS THRUN is a  67 y.o. male with degenerative joint disease who had left  total knee arthroplasty in 2003. Recently he developed loosening of prosthesis with severe, debilitating pain. He underwent revision arthroplasty on December 10. He initially did well postoperatively but 2 days ago he developed sudden, severe pain and swelling of his left knee. He then began to notice wound drainage and low-grade fever. His pain was so severe he could not stand and walk so EMS was called. He was taken to the [facility]. Blood cultures obtained there are growing gram-positive cocci in clusters in both sets. He was transferred here last night. Swab specimens of wound drainage show gram-positive cocci in pairs on Gram stain. He underwent incision and drainage and poly-exchange of his knee today. Operative Gram stain is pending.       Prior Functioning  Home Living Family/patient expects to be discharged to:: Private residence Living Arrangements: Spouse/significant other Available Help at Discharge: Family;Available 24 hours/day Type of Home: House Home Access: Stairs to enter [employer] of Steps: 5 Entrance Stairs-Rails: None Home Layout: Multi-level;Bed/bath upstairs Alternate Level Stairs-Number of Steps: 14 Alternate Level Stairs-Rails: Left Home Equipment: Crutches;Shower seat - built in Additional Comments: pt was using crutches since TKA on 10/02/13 and does not have RW at home Prior Function Level of Independence: Independent Comments: per pt he and his wife are semi retired.  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  He had his right knee replaced ~2 months ago Communication Communication: No difficulties Dominant Hand: Right    Cognition  Cognition Arousal/Alertness: Lethargic;Suspect due to medications Behavior During Therapy: [facility] for tasks assessed/performed Overall Cognitive Status: Impaired/Different from baseline Area of Impairment: Following  commands;Safety/judgement;Problem solving Following Commands: Follows multi-step commands with increased time Safety/Judgement: Decreased awareness of safety Problem Solving: Slow processing;Decreased initiation;Difficulty sequencing General Comments: Pt very lethargic when entering room and needed several minutes to get oriented possible due to pain medciations.     Extremity/Trunk Assessment Lower Extremity Assessment RLE Deficits / Details: wfl LLE Deficits / Details: overall weakness with functional strength 3/5    Balance    End of Session PT - End of Session Equipment Utilized During Treatment: Gait belt;Left knee immobilizer Activity Tolerance: Patient tolerated treatment well Patient left: in chair;with call bell/phone within reach;with nursing/sitter in room Nurse Communication: Mobility status CPM Left Knee CPM Left Knee: Off  GP     Rosalita Carey 10/19/2013, 9:08 AM  Jake Shark, PT DPT [phone]

## 2013-10-19 NOTE — Progress Notes (Signed)
Subjective: 1 Day Post-Op Procedure(s) (LRB): IRRIGATION AND DEBRIDEMENT KNEE WITH POLY EXCHANGE (Left) Patient reports pain as 5 on 0-10 scale.  Reports only "some" improvement in pain No nausea or vomiting  Objective: Vital signs in last 24 hours: Temp:  [98.4 F (36.9 C)-98.7 F (37.1 C)] 98.4 F (36.9 C) (12/27 1336) Pulse Rate:  [70-85] 70 (12/27 1336) Resp:  [16-18] 18 (12/27 1336) BP: (107-130)/(56-60) 107/60 mmHg (12/27 1336) SpO2:  [95 %-100 %] 100 % (12/27 1336)  Intake/Output from previous day: 12/26 0701 - 12/27 0700 In: 3570 [P.O.:920; I.V.:1950; IV Piggyback:700] Out: 2500 [Urine:2050; Drains:350; Blood:100] Intake/Output this shift: Total I/O In: 290 [P.O.:240; IV Piggyback:50] Out: 200 [Urine:200]   Recent Labs  10/17/13 1409 10/17/13 1800 10/19/13 0654  HGB 9.8* 9.4* 7.8*    Recent Labs  10/17/13 1800 10/19/13 0654  WBC 17.4* 6.7  RBC 2.80* 2.34*  HCT 28.3* 23.0*  PLT 175 142*    Recent Labs  10/17/13 1409 10/17/13 1800  NA 135 135  K 4.4 5.2*  CL 101 100  CO2 23 26  BUN 31* 34*  CREATININE 1.65* 1.89*  GLUCOSE 94 102*  CALCIUM 8.3* 8.4    Recent Labs  10/17/13 1800  INR 1.92*    Neurovascular intact Dorsiflexion/Plantar flexion intact Drain intact; minimal output Dressing in place  Assessment/Plan: 1 Day Post-Op Procedure(s) (LRB): IRRIGATION AND DEBRIDEMENT KNEE WITH POLY EXCHANGE (Left) Continue ABX therapy due to Culture taken of surgical site during joint revision Awaiting results of cardiac and blood cultures for final choices of antibiotics-appreciate ID consults  ROBERTS,JANE B 10/19/2013, 4:14 PM

## 2013-10-20 DIAGNOSIS — D62 Acute posthemorrhagic anemia: Secondary | ICD-10-CM | POA: Diagnosis not present

## 2013-10-20 LAB — CBC WITH DIFFERENTIAL/PLATELET
Eosinophils Absolute: 0.1 10*3/uL (ref 0.0–0.7)
Eosinophils Relative: 2 % (ref 0–5)
HCT: 23.4 % — ABNORMAL LOW (ref 39.0–52.0)
Hemoglobin: 7.9 g/dL — ABNORMAL LOW (ref 13.0–17.0)
Lymphs Abs: 0.6 10*3/uL — ABNORMAL LOW (ref 0.7–4.0)
MCH: 33.3 pg (ref 26.0–34.0)
Monocytes Relative: 8 % (ref 3–12)
Neutro Abs: 5.2 10*3/uL (ref 1.7–7.7)
Neutrophils Relative %: 80 % — ABNORMAL HIGH (ref 43–77)
Platelets: 156 10*3/uL (ref 150–400)
RBC: 2.37 MIL/uL — ABNORMAL LOW (ref 4.22–5.81)
WBC: 6.5 10*3/uL (ref 4.0–10.5)

## 2013-10-20 LAB — BASIC METABOLIC PANEL
Calcium: 8.7 mg/dL (ref 8.4–10.5)
GFR calc Af Amer: 55 mL/min — ABNORMAL LOW (ref >90)
GFR calc non Af Amer: 47 mL/min — ABNORMAL LOW (ref >90)
Glucose, Bld: 113 mg/dL — ABNORMAL HIGH (ref 70–99)
Potassium: 3.7 mEq/L (ref 3.5–5.1)
Sodium: 135 mEq/L (ref 135–145)

## 2013-10-20 LAB — WOUND CULTURE

## 2013-10-20 MED ORDER — SODIUM CHLORIDE 0.9 % IV SOLN
INTRAVENOUS | Status: DC
  Administered 2013-10-20 – 2013-10-21 (×3): via INTRAVENOUS

## 2013-10-20 NOTE — Progress Notes (Signed)
Subjective: 2 Days Post-Op Procedure(s) (LRB): IRRIGATION AND DEBRIDEMENT KNEE WITH POLY EXCHANGE (Left) Patient reports pain as 8 on 0-10 scale.  With movement   Much less pain at rest than prior to washout   Patient feels like he is improving, but mobility is still limited due to pain.    Objective: Vital signs in last 24 hours: Temp:  [98.4 F (36.9 C)-98.5 F (36.9 C)] 98.4 F (36.9 C) (12/28 0532) Pulse Rate:  [70-88] 88 (12/28 0532) Resp:  [18-20] 20 (12/28 0532) BP: (107-169)/(60-64) 169/64 mmHg (12/28 0532) SpO2:  [97 %-100 %] 97 % (12/28 0532)  Intake/Output from previous day: 12/27 0701 - 12/28 0700 In: 2180 [P.O.:480; I.V.:1100; IV Piggyback:600] Out: 1000 [Urine:900; Drains:100] Intake/Output this shift: Total I/O In: 240 [P.O.:240] Out: 400 [Urine:350; Drains:50]   Recent Labs  10/17/13 1409 10/17/13 1800 10/19/13 0654  HGB 9.8* 9.4* 7.8*    Recent Labs  10/17/13 1800 10/19/13 0654  WBC 17.4* 6.7  RBC 2.80* 2.34*  HCT 28.3* 23.0*  PLT 175 142*    Recent Labs  10/17/13 1409 10/17/13 1800  NA 135 135  K 4.4 5.2*  CL 101 100  CO2 23 26  BUN 31* 34*  CREATININE 1.65* 1.89*  GLUCOSE 94 102*  CALCIUM 8.3* 8.4    Recent Labs  10/17/13 1800  INR 1.92*    This patient has grown Staph Aureus from his knee    Sensitivities are pending.  ABD soft Neurovascular intact Sensation intact distally Intact pulses distally Dorsiflexion/Plantar flexion intact Incision: moderate drainage and left leg is still very swollen   wound at time of dressing change had moderate serous drainage with redness and 2+ effusion.  drain is still draining with  50cc in the last 8 hrs.  cloudy serosanguinous fluid is what is draining  Assessment/Plan: 2 Days Post-Op Procedure(s) (LRB): IRRIGATION AND DEBRIDEMENT KNEE WITH POLY EXCHANGE (Left) Principal Problem:   Infection of left total knee replacement   Staph aureus Active Problems:   Essential hypertension,  benign   GERD (gastroesophageal reflux disease)   Failed total left knee replacement   DJD (degenerative joint disease) of knee   Normocytic anemia   Acute renal insufficiency   Hyperglycemia   Bacteremia due to Staphylococcus   Postoperative anemia due to acute blood loss  Advance diet Up with therapy PICC line ordered today.  Increased IV fluid rate due to decreased renal functions and Vancomycin.  Ordered CBC to monitor anemia which is currently asymptomatic and BMET to follow renal functions.  Marc Maldonado 10/20/2013, 9:43 AM

## 2013-10-20 NOTE — Progress Notes (Signed)
Patient ID: Marc Maldonado, male   DOB: [birth year], 67 y.o.   MRN: [id number]         [facility]    Date of Admission:  10/17/2013   Total days of antibiotics 4         Principal Problem:   Infection of left total knee replacement   Staph aureus Active Problems:   Bacteremia due to Staphylococcus   Essential hypertension, benign   GERD (gastroesophageal reflux disease)   Failed total left knee replacement   DJD (degenerative joint disease) of knee   Normocytic anemia   Acute renal insufficiency   Hyperglycemia   Postoperative anemia due to acute blood loss   . aspirin EC  325 mg Oral Q breakfast  .  ceFAZolin (ANCEF) IV  1 g Intravenous Q8H  . docusate sodium  100 mg Oral BID  . lisinopril  20 mg Oral Daily  . rifampin  600 mg Oral Daily  . tamsulosin  0.4 mg Oral Daily  . vancomycin  1,500 mg Intravenous Q24H  . verapamil  240 mg Oral Daily    Subjective: Marc Maldonado is feeling better with less left leg pain.  Past Medical History  Diagnosis Date  . BPH (benign prostatic hyperplasia)   . Wears glasses   . Hypertension   . GERD (gastroesophageal reflux disease)     occas. indigestion   . Chronic kidney disease     renal stone-(2) tumors removed fr. bladder  . 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 Arthritis     low back & neck,- DDD  . DJD (degenerative joint disease)     History  Substance Use Topics  . Smoking status: Never Smoker   . Smokeless tobacco: Never Used  . Alcohol Use: No    History reviewed. No pertinent family history.  No Known Allergies  Objective: Temp:  [98.4 F (36.9 C)-98.5 F (36.9 C)] 98.4 F (36.9 C) (12/28 0532) Pulse Rate:  [70-88] 88 (12/28 0532) Resp:  [18-20] 20 (12/28 0532) BP: (107-169)/(60-64) 169/64 mmHg (12/28 0532) SpO2:  [97 %-100 %] 97 % (12/28 0532)  General: Marc Maldonado is alert and comfortable Skin: No rash Lungs: Clear Cor: Regular S1-S2 no  murmurs Left leg in brace. Drain still in.  Lab Results Lab Results  Component Value Date   WBC 6.5 10/20/2013   HGB 7.9* 10/20/2013   HCT 23.4* 10/20/2013   MCV 98.7 10/20/2013   PLT 156 10/20/2013    Lab Results  Component Value Date   CREATININE 1.89* 10/17/2013   BUN 34* 10/17/2013   NA 135 10/17/2013   K 5.2* 10/17/2013   CL 100 10/17/2013   CO2 26 10/17/2013    Lab Results  Component Value Date   ALT 15 10/17/2013   AST 16 10/17/2013   ALKPHOS 63 10/17/2013   BILITOT 1.2 10/17/2013      Microbiology: Recent Results (from the past 240 hour(s))  WOUND CULTURE     Status: None   Collection Time    10/17/13  1:56 PM      Result Value Range Status   Specimen Description WOUND KNEE LEFT   Final   Special Requests NONE   Final   Gram Stain     Final   Value: RARE WBC PRESENT, PREDOMINANTLY PMN     NO SQUAMOUS EPITHELIAL CELLS SEEN     RARE GRAM POSITIVE COCCI  IN PAIRS     Performed at [facility]   Culture     Final   Value: ABUNDANT STAPHYLOCOCCUS AUREUS     Note: RIFAMPIN AND GENTAMICIN SHOULD NOT BE USED AS SINGLE DRUGS FOR TREATMENT OF STAPH INFECTIONS.     Performed at [facility]   Report Status 10/20/2013 FINAL   Final   Organism ID, Bacteria STAPHYLOCOCCUS AUREUS   Final  URINE CULTURE     Status: None   Collection Time    10/17/13  4:16 PM      Result Value Range Status   Specimen Description URINE, CATHETERIZED   Final   Special Requests NONE   Final   Culture  Setup Time     Final   Value: 10/18/2013 00:16     Performed at [facility] Count     Final   Value: NO GROWTH     Performed at [facility]   Culture     Final   Value: NO GROWTH     Performed at [facility]   Report Status 10/18/2013 FINAL   Final  CULTURE, ROUTINE-ABSCESS     Status: None   Collection Time    10/18/13  8:10 AM      Result Value Range Status   Specimen Description ABSCESS LEFT KNEE   Final   Special  Requests NONE   Final   Gram Stain     Final   Value: ABUNDANT WBC PRESENT,BOTH PMN AND MONONUCLEAR     NO SQUAMOUS EPITHELIAL CELLS SEEN     MODERATE GRAM POSITIVE COCCI IN PAIRS     Performed at [facility]   Culture     Final   Value: MODERATE STAPHYLOCOCCUS AUREUS     Note: RIFAMPIN AND GENTAMICIN SHOULD NOT BE USED AS SINGLE DRUGS FOR TREATMENT OF STAPH INFECTIONS.     Performed at [facility]   Report Status PENDING   Incomplete  CULTURE, BLOOD (ROUTINE X 2)     Status: None   Collection Time    10/19/13  9:00 AM      Result Value Range Status   Specimen Description BLOOD LEFT ARM   Final   Special Requests BOTTLES DRAWN AEROBIC AND ANAEROBIC 10CC   Final   Culture  Setup Time     Final   Value: 10/19/2013 15:31     Performed at [facility]   Culture     Final   Value:        BLOOD CULTURE RECEIVED NO GROWTH TO DATE CULTURE WILL BE HELD FOR 5 DAYS BEFORE ISSUING A FINAL NEGATIVE REPORT     Performed at [facility]   Report Status PENDING   Incomplete  CULTURE, BLOOD (ROUTINE X 2)     Status: None   Collection Time    10/19/13  9:20 AM      Result Value Range Status   Specimen Description BLOOD LEFT HAND   Final   Special Requests BOTTLES DRAWN AEROBIC AND ANAEROBIC 10CC   Final   Culture  Setup Time     Final   Value: 10/19/2013 15:31     Performed at [facility]   Culture     Final   Value:        BLOOD CULTURE RECEIVED NO GROWTH TO DATE CULTURE WILL BE HELD FOR 5 DAYS BEFORE ISSUING A FINAL NEGATIVE REPORT  Performed at [facility]   Report Status PENDING   Incomplete    Studies/Results: No results found.  Assessment: Marc Maldonado is improving on therapy for MSSA bacteremia (both sets of blood cultures done at [facility] are growing MSSA) and a left prosthetic knee infection. We'll discontinue vancomycin and continue IV cefazolin and oral rifampin. Repeat blood cultures are negative at 36 hours. I would  prefer to wait on placement until rechecking blood cultures tomorrow.  Plan: 1. Continue cefazolin and rifampin 2. Check repeat creatinine 3. Discontinue vancomycin will 4. Hold off on PICC placement until checking blood cultures results tomorrow 5. Transthoracic echocardiogram  Cliffton Asters, MD [facility] [phone] pager   [phone] cell 10/20/2013, 1:29 PM

## 2013-10-20 NOTE — Progress Notes (Signed)
Physical Therapy Treatment Patient Details Name: Marc Maldonado MRN: [id number] DOB: [birth year] Today's Date: 10/20/2013 Time: 1433-1510 PT Time Calculation (min): 37 min  PT Assessment / Plan / Recommendation  History of Present Illness Marc Maldonado is a 67 y.o. male with degenerative joint disease who had left total knee arthroplasty in 2003. Recently he developed loosening of prosthesis with severe, debilitating pain. He underwent revision arthroplasty on December 10. He initially did well postoperatively but 2 days ago he developed sudden, severe pain and swelling of his left knee. He then began to notice wound drainage and low-grade fever. His pain was so severe he could not stand and walk so EMS was called. He was taken to the [facility]. Blood cultures obtained there are growing gram-positive cocci in clusters in both sets. He was transferred here last night. Swab specimens of wound drainage show gram-positive cocci in pairs on Gram stain. He underwent incision and drainage and poly-exchange of his knee today. Operative Gram stain is pending.   PT Comments   Making progress despite pain  Follow Up Recommendations  Home health PT;Supervision/Assistance - 24 hour     Does the patient have the potential to tolerate intense rehabilitation     Barriers to Discharge        Equipment Recommendations  Rolling walker with 5" wheels    Recommendations for Other Services    Frequency Min 5X/week   Progress towards PT Goals Progress towards PT goals: Progressing toward goals  Plan Current plan remains appropriate    Precautions / Restrictions Precautions Precautions: Knee Required Braces or Orthoses: Knee Immobilizer - Left Restrictions LLE Weight Bearing: Weight bearing as tolerated   Pertinent Vitals/Pain 6/10 L knee by the end of session patient repositioned for comfort     Mobility  Bed Mobility Bed Mobility: Supine to Sit Supine to Sit: 4: Min assist;With  rails;HOB elevated Details for Bed Mobility Assistance: (A) to elevate trunk OOB and (A) with left LE OOB.  Cues for proper technique and hand placement.  Transfers Transfers: Sit to Stand;Stand to Sit Sit to Stand: 4: Min assist;From bed Stand to Sit: 4: Min assist;To chair/3-in-1 Details for Transfer Assistance: (A) to initiate transfer with cues for hand placement Ambulation/Gait Ambulation/Gait Assistance: 1: +2 Total assist Ambulation/Gait: Patient Percentage: 80% Ambulation Distance (Feet): 50 Feet Assistive device: Rolling walker Ambulation/Gait Assistance Details: Cues for gait sequence and posture; RW adjusted for opotimal fit Gait Pattern: Step-to pattern;Decreased stance time - left;Decreased step length - right Gait velocity: decreased    Exercises     PT Diagnosis:    PT Problem List:   PT Treatment Interventions:     PT Goals (current goals can now be found in the care plan section) Acute Rehab PT Goals Patient Stated Goal: Home w/ spouse assist PT Goal Formulation: With patient/family Time For Goal Achievement: 10/26/13 Potential to Achieve Goals: Good  Visit Information  Last PT Received On: 10/20/13 Assistance Needed: +2 (helpful with chair) History of Present Illness: Marc Maldonado is a 67 y.o. male with degenerative joint disease who had left total knee arthroplasty in 2003. Recently he developed loosening of prosthesis with severe, debilitating pain. He underwent revision arthroplasty on December 10. He initially did well postoperatively but 2 days ago he developed sudden, severe pain and swelling of his left knee. He then began to notice wound drainage and low-grade fever. His pain was so severe he could not stand and walk so EMS was called. He  was taken to the [facility]. Blood cultures obtained there are growing gram-positive cocci in clusters in both sets. He was transferred here last night. Swab specimens of wound drainage show gram-positive cocci  in pairs on Gram stain. He underwent incision and drainage and poly-exchange of his knee today. Operative Gram stain is pending.    Subjective Data  Subjective: agreeable to getting up Patient Stated Goal: Home w/ spouse assist   Cognition  Cognition Arousal/Alertness: Awake/alert Behavior During Therapy: WFL for tasks assessed/performed Overall Cognitive Status: Within Functional Limits for tasks assessed    Balance     End of Session PT - End of Session Equipment Utilized During Treatment: Gait belt;Left knee immobilizer Activity Tolerance: Patient tolerated treatment well Patient left: in chair;with call bell/phone within reach;with nursing/sitter in room Nurse Communication: Mobility status   GP     Olen Pel [place],  [phone]  10/20/2013, 5:26 PM

## 2013-10-21 ENCOUNTER — Inpatient Hospital Stay (HOSPITAL_COMMUNITY): Payer: Medicare Other

## 2013-10-21 ENCOUNTER — Encounter (HOSPITAL_COMMUNITY): Payer: Self-pay | Admitting: Orthopedic Surgery

## 2013-10-21 DIAGNOSIS — I379 Nonrheumatic pulmonary valve disorder, unspecified: Secondary | ICD-10-CM

## 2013-10-21 LAB — CULTURE, ROUTINE-ABSCESS

## 2013-10-21 LAB — BASIC METABOLIC PANEL
BUN: 31 mg/dL — ABNORMAL HIGH (ref 6–23)
Calcium: 8.6 mg/dL (ref 8.4–10.5)
Chloride: 102 mEq/L (ref 96–112)
GFR calc Af Amer: 64 mL/min — ABNORMAL LOW (ref >90)
GFR calc non Af Amer: 55 mL/min — ABNORMAL LOW (ref >90)
Glucose, Bld: 98 mg/dL (ref 70–99)
Potassium: 3.6 mEq/L (ref 3.5–5.1)
Sodium: 134 mEq/L — ABNORMAL LOW (ref 135–145)

## 2013-10-21 LAB — CBC WITH DIFFERENTIAL/PLATELET
Basophils Absolute: 0 10*3/uL (ref 0.0–0.1)
Basophils Relative: 0 % (ref 0–1)
HCT: 23.7 % — ABNORMAL LOW (ref 39.0–52.0)
Hemoglobin: 7.9 g/dL — ABNORMAL LOW (ref 13.0–17.0)
Lymphs Abs: 0.8 10*3/uL (ref 0.7–4.0)
MCH: 32.4 pg (ref 26.0–34.0)
MCHC: 33.3 g/dL (ref 30.0–36.0)
Monocytes Absolute: 0.5 10*3/uL (ref 0.1–1.0)
Monocytes Relative: 9 % (ref 3–12)
Neutro Abs: 4.7 10*3/uL (ref 1.7–7.7)
Neutrophils Relative %: 76 % (ref 43–77)
RBC: 2.44 MIL/uL — ABNORMAL LOW (ref 4.22–5.81)

## 2013-10-21 MED ORDER — CEFAZOLIN SODIUM-DEXTROSE 2-3 GM-% IV SOLR
2.0000 g | Freq: Three times a day (TID) | INTRAVENOUS | Status: DC
  Administered 2013-10-21 – 2013-10-22 (×4): 2 g via INTRAVENOUS
  Filled 2013-10-21 (×5): qty 50

## 2013-10-21 NOTE — Progress Notes (Signed)
Utilization review completed.  

## 2013-10-21 NOTE — Progress Notes (Signed)
  Echocardiogram 2D Echocardiogram has been performed.  Cathie Beams 10/21/2013, 11:52 AM

## 2013-10-21 NOTE — Evaluation (Signed)
Occupational Therapy Evaluation Patient Details Name: Marc Maldonado MRN: [id number] DOB: 29-Apr-1946 Today's Date: 10/21/2013 Time: 7829-5621 OT Time Calculation (min): 31 min  OT Assessment / Plan / Recommendation History of present illness Marc Maldonado is a 67 y.o. male with degenerative joint disease who had left total knee arthroplasty in 2003. Recently he developed loosening of prosthesis with severe, debilitating pain. He underwent revision arthroplasty on December 10. He initially did well postoperatively but 2 days ago he developed sudden, severe pain and swelling of his left knee. He then began to notice wound drainage and low-grade fever. His pain was so severe he could not stand and walk so EMS was called. He was taken to the [facility]. Blood cultures obtained there are growing gram-positive cocci in clusters in both sets. He was transferred here last night. Swab specimens of wound drainage show gram-positive cocci in pairs on Gram stain. He underwent incision and drainage and poly-exchange of his knee today. Operative Gram stain is pending.   Clinical Impression   Pt currently declines 3in1 but has some difficulty with transitions on/off higher toilet without vanity or bar available at home. He will benefit from continued OT services to maximize ADL independence for d/c home.    OT Assessment  Patient needs continued OT Services    Follow Up Recommendations  No OT follow up;Supervision/Assistance - 24 hour    Barriers to Discharge      Equipment Recommendations   (pt refuses 3in1)    Recommendations for Other Services    Frequency  Min 2X/week    Precautions / Restrictions Precautions Precautions: Knee;Fall Required Braces or Orthoses: Knee Immobilizer - Left Knee Immobilizer - Left: On at all times Restrictions LLE Weight Bearing: Weight bearing as tolerated   Pertinent Vitals/Pain 5-6/10 L knee; reposition, rest    ADL  Eating/Feeding:  Independent Where Assessed - Eating/Feeding: Chair Grooming: Wash/dry face;Set up Where Assessed - Grooming: Supported sitting Upper Body Bathing: Chest;Right arm;Left arm;Abdomen;Set up Where Assessed - Upper Body Bathing: Unsupported sitting Lower Body Bathing: Minimal assistance Where Assessed - Lower Body Bathing: Supported sit to stand Upper Body Dressing: Set up Where Assessed - Upper Body Dressing: Unsupported sitting Lower Body Dressing: Minimal assistance Where Assessed - Lower Body Dressing: Supported sit to stand Toilet Transfer: Minimal assistance Toilet Transfer Method: Surveyor, minerals: Comfort height toilet;Grab bars Toileting - Architect and Hygiene: Min guard Where Assessed - Engineer, mining and Hygiene: Standing Equipment Used: Rolling walker ADL Comments: Pt up in room standing on his own when OT arrived. Emphasized need to call for assist if he wants to get up. Practiced to higher toilet in bathroom and pt with some difficulty with transitions on and off toilet without grab bar use (he doesnt have any grab bars or vanity next to toilet). Discussed 3in1 for safety to use armrests but pt declines 3in1 despite repeated explanation of recommendation for safety. Pt pushed up from toilet itself and some assist with using walker also. Emphasized pt MUST have someone hold walker steady if he is going to use it for support also. Later when wife and daughter arrived explained to them how to steady RW so it doesnt move when he stands. Pt able to reach and touch top of L foot but would need slight min assist for thoroughness with LB bathing and start clothing. Pt complaining of brace rubbing on back of L LE at the bottom of brace. Brought by egg crate foam  and explained use. Nursing tech to apply as pt in transition back to bed.     OT Diagnosis: Generalized weakness  OT Problem List: Decreased strength;Decreased knowledge of use of DME or  AE OT Treatment Interventions: Self-care/ADL training;DME and/or AE instruction;Therapeutic activities   OT Goals(Current goals can be found in the care plan section) Acute Rehab OT Goals Patient Stated Goal: none stated OT Goal Formulation: With patient Time For Goal Achievement: 10/28/13 Potential to Achieve Goals: Good  Visit Information  Last OT Received On: 10/21/13 Assistance Needed: +1 History of Present Illness: Marc Maldonado is a 67 y.o. male with degenerative joint disease who had left total knee arthroplasty in 2003. Recently he developed loosening of prosthesis with severe, debilitating pain. He underwent revision arthroplasty on December 10. He initially did well postoperatively but 2 days ago he developed sudden, severe pain and swelling of his left knee. He then began to notice wound drainage and low-grade fever. His pain was so severe he could not stand and walk so EMS was called. He was taken to the [facility]. Blood cultures obtained there are growing gram-positive cocci in clusters in both sets. He was transferred here last night. Swab specimens of wound drainage show gram-positive cocci in pairs on Gram stain. He underwent incision and drainage and poly-exchange of his knee today. Operative Gram stain is pending.       Prior Functioning     Home Living Family/patient expects to be discharged to:: Private residence Living Arrangements: Spouse/significant other Available Help at Discharge: Family;Available 24 hours/day Type of Home: House Home Access: Stairs to enter [employer] of Steps: 5 Entrance Stairs-Rails: None Home Layout: Multi-level;Bed/bath upstairs Alternate Level Stairs-Number of Steps: 14 Alternate Level Stairs-Rails: Left Home Equipment: Crutches;Shower seat - built in Additional Comments: pt was using crutches since TKA on 10/02/13 and does not have RW at home Prior Function Level of Independence: Independent Comments: per  pt he and his wife are semi retired.  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  He had his right knee replaced ~2 months ago Communication Communication: No difficulties Dominant Hand: Right         Vision/Perception     Cognition  Cognition Arousal/Alertness: Awake/alert Behavior During Therapy: WFL for tasks assessed/performed Overall Cognitive Status: Within Functional Limits for tasks assessed    Extremity/Trunk Assessment Upper Extremity Assessment Upper Extremity Assessment: Overall WFL for tasks assessed     Mobility Transfers Transfers: Sit to Stand;Stand to Sit Sit to Stand: 4: Min assist;With upper extremity assist;From toilet Stand to Sit: 4: Min assist;With upper extremity assist;To chair/3-in-1;To toilet Details for Transfer Assistance: cues for safety and min assist for safety especially with lower toilet surface     Exercise     Balance Balance Balance Assessed: Yes Static Sitting Balance Static Sitting - Balance Support: Bilateral upper extremity supported Static Sitting - Level of Assistance: 5: Stand by assistance   End of Session OT - End of Session Equipment Utilized During Treatment: Rolling walker Activity Tolerance: Patient tolerated treatment well Patient left: in chair;with call bell/phone within reach  GO     Lennox Laity [phone] 10/21/2013, 10:50 AM

## 2013-10-21 NOTE — Progress Notes (Addendum)
Subjective: 3 Days Post-Op Procedure(s) (LRB): IRRIGATION AND DEBRIDEMENT KNEE WITH POLY EXCHANGE (Left) Patient reports pain as 9 on 0-10 scale.  Still in a moderate amount of pain.  Nauseous and vomiting.    Objective: Vital signs in last 24 hours: Temp:  [98 F (36.7 C)-98.8 F (37.1 C)] 98.8 F (37.1 C) (12/29 2956) Pulse Rate:  [65-83] 83 (12/29 0637) Resp:  [18-19] 18 (12/29 0637) BP: (159-175)/(70-85) 175/85 mmHg (12/29 0637) SpO2:  [95 %-96 %] 96 % (12/29 0637)  Intake/Output from previous day: 12/28 0701 - 12/29 0700 In: 1730 [P.O.:1080; I.V.:600; IV Piggyback:50] Out: 910 [Urine:750; Drains:160] Intake/Output this shift: Total I/O In: -  Out: 200 [Urine:200]   Recent Labs  10/19/13 0654 10/20/13 0951 10/21/13 0442  HGB 7.8* 7.9* 7.9*    Recent Labs  10/20/13 0951 10/21/13 0442  WBC 6.5 6.1  RBC 2.37* 2.44*  HCT 23.4* 23.7*  PLT 156 161    Recent Labs  10/20/13 1247 10/21/13 0442  NA 135 134*  K 3.7 3.6  CL 102 102  CO2 24 24  BUN 39* 31*  CREATININE 1.48* 1.30  GLUCOSE 113* 98  CALCIUM 8.7 8.6   No results found for this basename: LABPT, INR,  in the last 72 hours  Neurologically intact ABD soft Neurovascular intact Sensation intact distally Intact pulses distally Dorsiflexion/Plantar flexion intact Incision: moderate drainage Compartment soft Increased tenderness to left calf Negative Homan's hemovac drain pulled by me Dressing changed by me Moderate amount of serosanguinous drainage  Assessment/Plan: 3 Days Post-Op Procedure(s) (LRB): IRRIGATION AND DEBRIDEMENT KNEE WITH POLY EXCHANGE (Left) Advance diet Up with therapy PICC line placement pending ID Echo done today X-ray 2 view left knee today X-ray left tib/fib today Possible DC home tomorrow with home health.  Will need PT and IV  ANTON, M. LINDSEY 10/21/2013, 1:13 PM

## 2013-10-21 NOTE — Progress Notes (Signed)
Subjective: 3 Days Post-Op Procedure(s) (LRB): IRRIGATION AND DEBRIDEMENT KNEE WITH POLY EXCHANGE (Left) Patient reports pain as 9 on 0-10 scale.  Still in quite a bit of pain for which he is having a hard time weight bearing on the left.  He denies any fever or chills.  No nausea or vomiting.  No lightheadedness/dizziness, shortness of breath, or heart palpitations.    Objective: Vital signs in last 24 hours: Temp:  [98 F (36.7 C)-98.8 F (37.1 C)] 98.8 F (37.1 C) (12/29 0637) Pulse Rate:  [65-83] 83 (12/29 0637) Resp:  [18-19] 18 (12/29 0637) BP: (159-175)/(70-85) 175/85 mmHg (12/29 0637) SpO2:  [95 %-96 %] 96 % (12/29 0637)  Intake/Output from previous day: 12/28 0701 - 12/29 0700 In: 1730 [P.O.:1080; I.V.:600; IV Piggyback:50] Out: 650 [Urine:550; Drains:100] Intake/Output this shift:     Recent Labs  10/19/13 0654 10/20/13 0951 10/21/13 0442  HGB 7.8* 7.9* 7.9*    Recent Labs  10/20/13 0951 10/21/13 0442  WBC 6.5 6.1  RBC 2.37* 2.44*  HCT 23.4* 23.7*  PLT 156 161    Recent Labs  10/20/13 1247 10/21/13 0442  NA 135 134*  K 3.7 3.6  CL 102 102  CO2 24 24  BUN 39* 31*  CREATININE 1.48* 1.30  GLUCOSE 113* 98  CALCIUM 8.7 8.6   No results found for this basename: LABPT, INR,  in the last 72 hours  Neurologically intact ABD soft Neurovascular intact Sensation intact distally Intact pulses distally Dorsiflexion/Plantar flexion intact Incision: moderate drainage Compartment soft No calf tenderness, negative Homan's bilaterally Dressing changed by me today. Moderate amount of serosanguineous discharge on dressing and in hemovac drain.   Mild erythema around incision. 2+ effusion    Assessment/Plan: 3 Days Post-Op Procedure(s) (LRB): IRRIGATION AND DEBRIDEMENT KNEE WITH POLY EXCHANGE (Left) Advance diet Up with therapy Weight bearing as tolerated Blood cultures today.   DC pending today's blood cultures per ID picc line pending today's  blood cultures per ID Echo today. Continue with ancef + rifampin per ID  ANTON, M. LINDSEY 10/21/2013, 7:05 AM

## 2013-10-21 NOTE — Progress Notes (Signed)
PT Cancellation Note  Patient Details Name: Marc Maldonado MRN: [id number] DOB: [birth year]   Cancelled Treatment:    Reason Eval/Treat Not Completed: Other (comment) (Politely declined due to nausea)   Van Clines [facility] 10/21/2013, 2:34 PM

## 2013-10-21 NOTE — Progress Notes (Signed)
[facility]  Date of Admission:  10/17/2013  Antibiotics: Antibiotics Given (last 72 hours)   Date/Time Action Medication Dose Rate   10/18/13 1624 Given   vancomycin (VANCOCIN) 2,500 mg in sodium chloride 0.9 % 500 mL IVPB 2,500 mg 250 mL/hr   10/18/13 1633 Given   rifampin (RIFADIN) capsule 600 mg 600 mg    10/18/13 2127 Given   ceFAZolin (ANCEF) IVPB 1 g/50 mL premix 1 g 100 mL/hr   10/19/13 0456 Given   ceFAZolin (ANCEF) IVPB 1 g/50 mL premix 1 g 100 mL/hr   10/19/13 1057 Given   rifampin (RIFADIN) capsule 600 mg 600 mg    10/19/13 1059 Given   ceFAZolin (ANCEF) IVPB 1 g/50 mL premix 1 g 100 mL/hr   10/19/13 1559 Given   vancomycin (VANCOCIN) 1,500 mg in sodium chloride 0.9 % 500 mL IVPB 1,500 mg 250 mL/hr   10/19/13 1800 Given   ceFAZolin (ANCEF) IVPB 1 g/50 mL premix 1 g 100 mL/hr   10/20/13 0319 Given   ceFAZolin (ANCEF) IVPB 1 g/50 mL premix 1 g 100 mL/hr   10/20/13 4540 Given   ceFAZolin (ANCEF) IVPB 1 g/50 mL premix 1 g 100 mL/hr   10/20/13 9811 Given   rifampin (RIFADIN) capsule 600 mg 600 mg    10/20/13 1805 Given   ceFAZolin (ANCEF) IVPB 1 g/50 mL premix 1 g 100 mL/hr   10/21/13 9147 Given   ceFAZolin (ANCEF) IVPB 1 g/50 mL premix 1 g 100 mL/hr   10/21/13 1028 Given   rifampin (RIFADIN) capsule 600 mg 600 mg    10/21/13 1029 Given   ceFAZolin (ANCEF) IVPB 1 g/50 mL premix 1 g 100 mL/hr      Subjective: No complaints, repeat blood cultures remain negative  Objective: Temp:  [97.9 F (36.6 C)-98.8 F (37.1 C)] 97.9 F (36.6 C) (12/29 1300) Pulse Rate:  [65-83] 83 (12/29 1300) Resp:  [18-20] 20 (12/29 1300) BP: (159-188)/(77-85) 188/77 mmHg (12/29 1300) SpO2:  [95 %-96 %] 96 % (12/29 1300)  General: Awake, alert, nad Skin: no rashes Lungs: CTA B Cor: RRR Abdomen: Soft, nt, nd Ext: no edema  Lab Results Lab Results  Component Value Date   WBC 6.1 10/21/2013   HGB 7.9* 10/21/2013   HCT 23.7* 10/21/2013   MCV 97.1  10/21/2013   PLT 161 10/21/2013    Lab Results  Component Value Date   CREATININE 1.30 10/21/2013   BUN 31* 10/21/2013   NA 134* 10/21/2013   K 3.6 10/21/2013   CL 102 10/21/2013   CO2 24 10/21/2013    Lab Results  Component Value Date   ALT 15 10/17/2013   AST 16 10/17/2013   ALKPHOS 63 10/17/2013   BILITOT 1.2 10/17/2013      Microbiology: Recent Results (from the past 240 hour(s))  WOUND CULTURE     Status: None   Collection Time    10/17/13  1:56 PM      Result Value Range Status   Specimen Description WOUND KNEE LEFT   Final   Special Requests NONE   Final   Gram Stain     Final   Value: RARE WBC PRESENT, PREDOMINANTLY PMN     NO SQUAMOUS EPITHELIAL CELLS SEEN     RARE GRAM POSITIVE COCCI     IN PAIRS     Performed at [facility]   Culture     Final   Value: ABUNDANT STAPHYLOCOCCUS AUREUS  Note: RIFAMPIN AND GENTAMICIN SHOULD NOT BE USED AS SINGLE DRUGS FOR TREATMENT OF STAPH INFECTIONS.     Performed at [facility]   Report Status 10/20/2013 FINAL   Final   Organism ID, Bacteria STAPHYLOCOCCUS AUREUS   Final  URINE CULTURE     Status: None   Collection Time    10/17/13  4:16 PM      Result Value Range Status   Specimen Description URINE, CATHETERIZED   Final   Special Requests NONE   Final   Culture  Setup Time     Final   Value: 10/18/2013 00:16     Performed at [facility] Count     Final   Value: NO GROWTH     Performed at [facility]   Culture     Final   Value: NO GROWTH     Performed at [facility]   Report Status 10/18/2013 FINAL   Final  CULTURE, ROUTINE-ABSCESS     Status: None   Collection Time    10/18/13  8:10 AM      Result Value Range Status   Specimen Description ABSCESS LEFT KNEE   Final   Special Requests NONE   Final   Gram Stain     Final   Value: ABUNDANT WBC PRESENT,BOTH PMN AND MONONUCLEAR     NO SQUAMOUS EPITHELIAL CELLS SEEN     MODERATE GRAM POSITIVE COCCI IN PAIRS       Performed at [facility]   Culture     Final   Value: MODERATE STAPHYLOCOCCUS AUREUS     Note: RIFAMPIN AND GENTAMICIN SHOULD NOT BE USED AS SINGLE DRUGS FOR TREATMENT OF STAPH INFECTIONS.     Performed at [facility]   Report Status 10/21/2013 FINAL   Final   Organism ID, Bacteria STAPHYLOCOCCUS AUREUS   Final  CULTURE, BLOOD (ROUTINE X 2)     Status: None   Collection Time    10/19/13  9:00 AM      Result Value Range Status   Specimen Description BLOOD LEFT ARM   Final   Special Requests BOTTLES DRAWN AEROBIC AND ANAEROBIC 10CC   Final   Culture  Setup Time     Final   Value: 10/19/2013 15:31     Performed at [facility]   Culture     Final   Value:        BLOOD CULTURE RECEIVED NO GROWTH TO DATE CULTURE WILL BE HELD FOR 5 DAYS BEFORE ISSUING A FINAL NEGATIVE REPORT     Performed at [facility]   Report Status PENDING   Incomplete  CULTURE, BLOOD (ROUTINE X 2)     Status: None   Collection Time    10/19/13  9:20 AM      Result Value Range Status   Specimen Description BLOOD LEFT HAND   Final   Special Requests BOTTLES DRAWN AEROBIC AND ANAEROBIC 10CC   Final   Culture  Setup Time     Final   Value: 10/19/2013 15:31     Performed at [facility]   Culture     Final   Value:        BLOOD CULTURE RECEIVED NO GROWTH TO DATE CULTURE WILL BE HELD FOR 5 DAYS BEFORE ISSUING A FINAL NEGATIVE REPORT     Performed at [facility]   Report Status PENDING   Incomplete    Studies/Results:  Dg Knee 1-2 Views Left  10/21/2013   CLINICAL DATA:  Prior knee replacement.  EXAM: LEFT KNEE - 1-2 VIEW  COMPARISON:  None.  FINDINGS: Diffuse soft-tissue swelling is noted. Surgical drainage tube noted. Prostheses appear in good anatomic alignment.  IMPRESSION: Patient status postop left knee replacement. Good anatomic alignment noted. On the tibial series, subtle fracture lucency noted adjacent to the distal tip of the tibial prosthesis is again  noted. This was better demonstrated on prior study of 10/02/2013.   Electronically Signed   By: Maisie Fus  Register   On: 10/21/2013 13:10   Dg Tibia/fibula Left  10/21/2013   CLINICAL DATA:  Infection.  EXAM: LEFT TIBIA AND FIBULA - 2 VIEW  COMPARISON:  10/02/2013.  FINDINGS: Subtle fracture along the distal tip of the tibial prosthesis noted. This is better demonstrated on full tibial series of 10/02/2013. No new bony abnormality identified. Tibial prostheses is in stable position. Diffuse soft tissue swelling.  IMPRESSION: 1. Subtle fracture noted in the proximal tibia distal to the tibial prosthesis, this is better demonstrated on full tibial series of 10/02/2013. 2. Diffuse soft tissue swelling.  No new bony abnormality.   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  - repeat blood cultures remain negative, will order picc.  Awaiting TTE to assure no obvious IE but no TEE indicated since he will require prolonged IV antibiotics regardless.   -will need weekly cbc, cmp to [facility] -antibiotics per home health protocol, I will increase dose to 2 grams q 8 hours. End of IV treatment date February 5th.  -will need oral continuation therapy (Bactrim) at end of treatment -we will arrange follow up with [facility] in about 2 weeks -no further issues as long as TTE without any significant abnormality  Thank you for the consult   Felesha Moncrieffe, Molly Maduro, MD [facility] [email or web address] [phone] pager   [phone] cell 10/21/2013, 3:55 PM

## 2013-10-22 ENCOUNTER — Encounter (HOSPITAL_COMMUNITY): Payer: Self-pay

## 2013-10-22 LAB — CBC WITH DIFFERENTIAL/PLATELET
Basophils Absolute: 0 10*3/uL (ref 0.0–0.1)
Basophils Relative: 1 % (ref 0–1)
Eosinophils Absolute: 0.2 10*3/uL (ref 0.0–0.7)
Eosinophils Relative: 3 % (ref 0–5)
HCT: 22.8 % — ABNORMAL LOW (ref 39.0–52.0)
Hemoglobin: 7.8 g/dL — ABNORMAL LOW (ref 13.0–17.0)
Lymphocytes Relative: 15 % (ref 12–46)
Lymphs Abs: 0.8 10*3/uL (ref 0.7–4.0)
MCH: 32.8 pg (ref 26.0–34.0)
MCV: 95.8 fL (ref 78.0–100.0)
Monocytes Absolute: 0.6 10*3/uL (ref 0.1–1.0)
Monocytes Relative: 10 % (ref 3–12)
RBC: 2.38 MIL/uL — ABNORMAL LOW (ref 4.22–5.81)
RDW: 14 % (ref 11.5–15.5)
WBC: 5.7 10*3/uL (ref 4.0–10.5)

## 2013-10-22 LAB — BASIC METABOLIC PANEL
CO2: 24 mEq/L (ref 19–32)
Chloride: 102 mEq/L (ref 96–112)
Glucose, Bld: 93 mg/dL (ref 70–99)
Potassium: 3.6 mEq/L — ABNORMAL LOW (ref 3.7–5.3)
Sodium: 139 mEq/L (ref 137–147)

## 2013-10-22 MED ORDER — HEPARIN SOD (PORK) LOCK FLUSH 100 UNIT/ML IV SOLN
250.0000 [IU] | INTRAVENOUS | Status: AC | PRN
  Administered 2013-10-22: 250 [IU]

## 2013-10-22 MED ORDER — RIFAMPIN 300 MG PO CAPS: 600.0000 mg | ORAL_CAPSULE | Freq: Every day | ORAL | Status: DC

## 2013-10-22 MED ORDER — SODIUM CHLORIDE 0.9 % IJ SOLN: 10.0000 mL | INTRAMUSCULAR | Status: DC | PRN

## 2013-10-22 MED ORDER — CEFAZOLIN SODIUM-DEXTROSE 2-3 GM-% IV SOLR: 2.0000 g | Freq: Three times a day (TID) | INTRAVENOUS | Status: DC

## 2013-10-22 MED ORDER — CEFAZOLIN SODIUM-DEXTROSE 2-3 GM-% IV SOLR
2.0000 g | Freq: Three times a day (TID) | INTRAVENOUS | Status: DC
  Filled 2013-10-22 (×2): qty 50

## 2013-10-22 NOTE — Progress Notes (Signed)
Subjective: 4 Days Post-Op Procedure(s) (LRB): IRRIGATION AND DEBRIDEMENT KNEE WITH POLY EXCHANGE (Left) Patient reports pain as 7 on 0-10 scale.  Patient is doing much better with pain today.  Still having some nausea but no vomiting today.  Has passed gas and had a bowel movement.    Objective: Vital signs in last 24 hours: Temp:  [97.9 F (36.6 C)-99 F (37.2 C)] 98.6 F (37 C) (12/30 1610) Pulse Rate:  [72-83] 72 (12/30 0619) Resp:  [18-20] 18 (12/30 0619) BP: (173-188)/(74-77) 179/76 mmHg (12/30 0619) SpO2:  [96 %-99 %] 99 % (12/30 0619)  Intake/Output from previous day: 12/29 0701 - 12/30 0700 In: 1560 [P.O.:360; I.V.:1200] Out: 1000 [Urine:1000] Intake/Output this shift:     Recent Labs  10/20/13 0951 10/21/13 0442  HGB 7.9* 7.9*    Recent Labs  10/20/13 0951 10/21/13 0442  WBC 6.5 6.1  RBC 2.37* 2.44*  HCT 23.4* 23.7*  PLT 156 161    Recent Labs  10/20/13 1247 10/21/13 0442  NA 135 134*  K 3.7 3.6  CL 102 102  CO2 24 24  BUN 39* 31*  CREATININE 1.48* 1.30  GLUCOSE 113* 98  CALCIUM 8.7 8.6   No results found for this basename: LABPT, INR,  in the last 72 hours  Physical Exam: Neurovascularly intact distally Compartments soft Mild calf tenderness Negative homan's bilaterally Scant drainage through dressing Incision: mild erythema, 2+ effusion, scant drainage    Assessment/Plan: 4 Days Post-Op Procedure(s) (LRB): IRRIGATION AND DEBRIDEMENT KNEE WITH POLY EXCHANGE (Left) Advance diet Up with therapy Discharge home with home health Should get picc line today   ANTON, M. LINDSEY 10/22/2013, 8:12 AM

## 2013-10-22 NOTE — Progress Notes (Signed)
Physical Therapy Treatment Patient Details Name: Marc Maldonado MRN: [id number] DOB: 06/27/1946 Today's Date: 10/22/2013 Time: 0922-1000 PT Time Calculation (min): 38 min  PT Assessment / Plan / Recommendation  History of Present Illness JEWEL Maldonado is a 67 y.o. male with degenerative joint disease who had left total knee arthroplasty in 2003. Recently he developed loosening of prosthesis with severe, debilitating pain. He underwent revision arthroplasty on December 10. He initially did well postoperatively but 2 days ago he developed sudden, severe pain and swelling of his left knee. He then began to notice wound drainage and low-grade fever. His pain was so severe he could not stand and walk so EMS was called. He was taken to the [facility]. Blood cultures obtained there are growing gram-positive cocci in clusters in both sets. He was transferred here last night. Swab specimens of wound drainage show gram-positive cocci in pairs on Gram stain. He underwent incision and drainage and poly-exchange of his knee today. Operative Gram stain is pending.   PT Comments   Pt agreeable to participate in therapy.  States pain better controlled today but still present.  Attempted steps this session but unsuccessful due to pain.  Pt states he can stay in his "man cave" for the first few days without having to negotiate steps.     Follow Up Recommendations  Home health PT;Supervision/Assistance - 24 hour     Does the patient have the potential to tolerate intense rehabilitation     Barriers to Discharge        Equipment Recommendations  Rolling walker with 5" wheels    Recommendations for Other Services    Frequency Min 5X/week   Progress towards PT Goals Progress towards PT goals: Progressing toward goals  Plan Current plan remains appropriate    Precautions / Restrictions Precautions Precautions: Knee;Fall Required Braces or Orthoses: Knee Immobilizer - Left Knee Immobilizer -  Left: On at all times Restrictions LLE Weight Bearing: Weight bearing as tolerated   Pertinent Vitals/Pain 4-5/10 Lt knee at beginning but then increased to 8-9/10 after ambulation & attempting steps.  RN notified for pain medication.  Repositioned for comfort.      Mobility  Bed Mobility Bed Mobility: Supine to Sit;Sitting - Scoot to Edge of Bed;Sit to Supine Supine to Sit: 5: Supervision;HOB flat Sitting - Scoot to Edge of Bed: 5: Supervision Sit to Supine: 4: Min assist;HOB flat Details for Bed Mobility Assistance: (A) to lift LE's back into bed.   Transfers Transfers: Sit to Stand;Stand to Sit Sit to Stand: 4: Min guard;With upper extremity assist;With armrests;From bed;From chair/3-in-1 Stand to Sit: 4: Min guard;With upper extremity assist;With armrests;To bed;To chair/3-in-1 Details for Transfer Assistance: Pt demonstrated safe hand placement & technique.   Ambulation/Gait Ambulation/Gait Assistance: 4: Min guard Ambulation Distance (Feet): 75 Feet Assistive device: Rolling walker Ambulation/Gait Assistance Details: Trialed ambulation with crutches initially per pt's request but then switched over to RW due to pain in Lt knee.  Pt able to achieve more sufficient step when using walker & had more use of UE's to offload weight from LLE.   Gait Pattern: Step-to pattern;Decreased step length - right;Decreased weight shift to left Gait velocity: decreased Stairs: Yes Stairs Assistance Details (indicate cue type and reason): attempted but unsuccessful due to insufficient tolerance for WBing through LLE when attempting to step up with Rt due to pain.        PT Goals (current goals can now be found in the care plan section)  Acute Rehab PT Goals Patient Stated Goal: none stated PT Goal Formulation: With patient/family Time For Goal Achievement: 10/26/13 Potential to Achieve Goals: Good  Visit Information  Last PT Received On: 10/22/13 Assistance Needed: +1 History of Present  Illness: Marc Maldonado is a 67 y.o. male with degenerative joint disease who had left total knee arthroplasty in 2003. Recently he developed loosening of prosthesis with severe, debilitating pain. He underwent revision arthroplasty on December 10. He initially did well postoperatively but 2 days ago he developed sudden, severe pain and swelling of his left knee. He then began to notice wound drainage and low-grade fever. His pain was so severe he could not stand and walk so EMS was called. He was taken to the [facility]. Blood cultures obtained there are growing gram-positive cocci in clusters in both sets. He was transferred here last night. Swab specimens of wound drainage show gram-positive cocci in pairs on Gram stain. He underwent incision and drainage and poly-exchange of his knee today. Operative Gram stain is pending.    Subjective Data  Patient Stated Goal: none stated   Cognition  Cognition Arousal/Alertness: Awake/alert Behavior During Therapy: WFL for tasks assessed/performed Overall Cognitive Status: Within Functional Limits for tasks assessed    Balance     End of Session     GP     Lara Mulch 10/22/2013, 2:37 PM   Verdell Face, PTA [phone] 10/22/2013

## 2013-10-22 NOTE — Progress Notes (Signed)
Peripherally Inserted Central Catheter/Midline Placement  The IV Nurse has discussed with the patient and/or persons authorized to consent for the patient, the purpose of this procedure and the potential benefits and risks involved with this procedure.  The benefits include less needle sticks, lab draws from the catheter and patient may be discharged home with the catheter.  Risks include, but not limited to, infection, bleeding, blood clot (thrombus formation), and puncture of an artery; nerve damage and irregular heat beat.  Alternatives to this procedure were also discussed.  PICC/Midline Placement Documentation        Timmothy Sours 10/22/2013, 11:24 AM

## 2013-10-25 LAB — CULTURE, BLOOD (ROUTINE X 2)
Culture: NO GROWTH
Culture: NO GROWTH

## 2013-10-30 NOTE — Discharge Summary (Signed)
Patient ID: Marc Maldonado MRN: [id number] DOB/AGE: April 02, 1946 68 y.o.  Admit date: 10/17/2013 Discharge date: 10/30/2013  Admission Diagnoses:  Principal Problem:   Infection of left total knee replacement   Staph aureus Active Problems:   Essential hypertension, benign   GERD (gastroesophageal reflux disease)   Failed total left knee replacement   DJD (degenerative joint disease) of knee   Normocytic anemia   Acute renal insufficiency   Hyperglycemia   Bacteremia due to Staphylococcus   Postoperative anemia due to acute blood loss   Discharge Diagnoses:  Same  Past Medical History  Diagnosis Date  . BPH (benign prostatic hyperplasia)   . Wears glasses   . Hypertension   . GERD (gastroesophageal reflux disease)     occas. indigestion   . Chronic kidney disease     renal stone-(2) tumors removed fr. bladder  . 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 Arthritis     low back & neck,- DDD  . DJD (degenerative joint disease)     Surgeries: Procedure(s): IRRIGATION AND DEBRIDEMENT KNEE WITH POLY EXCHANGE on 10/17/2013 - 10/18/2013   Consultants: Treatment Team:  [name], MD  Discharged Condition: Improved  Hospital Course: Marc Maldonado is an 68 y.o. male who was admitted 10/17/2013 for operative treatment ofInfection of prosthetic knee joint. Patient has severe unremitting pain that affects sleep, daily activities, and work/hobbies. After pre-op clearance the patient was taken to the operating room on 10/17/2013 - 10/18/2013 and underwent  Procedure(s): IRRIGATION AND DEBRIDEMENT KNEE WITH POLY EXCHANGE.    Patient was given perioperative antibiotics:  Anti-infectives   Start     Dose/Rate Route Frequency Ordered Stop   10/22/13 2200  ceFAZolin (ANCEF) IVPB 2 g/50 mL premix  Status:  Discontinued     2 g 100 mL/hr over 30 Minutes Intravenous 3 times per day 10/22/13 1515 10/22/13 2000   10/22/13 0000   ceFAZolin (ANCEF) 2-3 GM-% SOLR     2 g 100 mL/hr over 30 Minutes Intravenous Every 8 hours 10/22/13 1422     10/22/13 0000  rifampin (RIFADIN) 300 MG capsule     600 mg Oral Daily 10/22/13 1422     10/22/13 0000  ceFAZolin (ANCEF) 2-3 GM-% SOLR     2 g 100 mL/hr over 30 Minutes Intravenous Every 8 hours 10/22/13 1517     10/21/13 1800  ceFAZolin (ANCEF) IVPB 2 g/50 mL premix  Status:  Discontinued     2 g 100 mL/hr over 30 Minutes Intravenous Every 8 hours 10/21/13 1559 10/22/13 2000   10/19/13 1600  vancomycin (VANCOCIN) 1,500 mg in sodium chloride 0.9 % 500 mL IVPB  Status:  Discontinued     1,500 mg 250 mL/hr over 120 Minutes Intravenous Every 24 hours 10/18/13 1507 10/20/13 1334   10/18/13 1515  vancomycin (VANCOCIN) 2,500 mg in sodium chloride 0.9 % 500 mL IVPB     2,500 mg 250 mL/hr over 120 Minutes Intravenous  Once 10/18/13 1507 10/18/13 1824   10/18/13 1500  rifampin (RIFADIN) capsule 600 mg  Status:  Discontinued     600 mg Oral Daily 10/18/13 1451 10/22/13 2000   10/17/13 1830  ceFAZolin (ANCEF) IVPB 1 g/50 mL premix  Status:  Discontinued     1 g 100 mL/hr over 30 Minutes Intravenous Every 8 hours 10/17/13 1817 10/21/13 1559       Patient was given sequential compression devices,  early ambulation, and chemoprophylaxis to prevent DVT.  Patient benefited maximally from hospital stay and there were no complications.    Recent vital signs: No data found.    Recent laboratory studies: No results found for this basename: WBC, HGB, HCT, PLT, NA, K, CL, CO2, BUN, CREATININE, GLUCOSE, PT, INR, CALCIUM, 2,  in the last 72 hours   Discharge Medications:     Medication List         ALEVE 220 MG Caps  Generic drug:  Naproxen Sodium  Take 440 mg by mouth daily as needed (pain).     aspirin 325 MG EC tablet  Take 1 tablet (325 mg total) by mouth daily with breakfast.     b complex vitamins tablet  Take 1 tablet by mouth daily.     Calcium-Magnesium-Vitamin D  [id number] MG-MG-UNIT Tabs  Take 1 tablet by mouth daily.     ceFAZolin 2-3 GM-% Solr  Commonly known as:  ANCEF  Inject 50 mLs (2 g total) into the vein every 8 (eight) hours. 1 month supply     ceFAZolin 2-3 GM-% Solr  Commonly known as:  ANCEF  Inject 50 mLs (2 g total) into the vein every 8 (eight) hours.     cholecalciferol 1000 UNITS tablet  Commonly known as:  VITAMIN D  Take 1,000 Units by mouth daily.     co-enzyme Q-10 30 MG capsule  Take 30 mg by mouth daily.     diphenhydramine-acetaminophen 25-500 MG Tabs  Commonly known as:  TYLENOL PM  Take 2 tablets by mouth at bedtime as needed (sleep/pain).     docusate sodium 100 MG capsule  Commonly known as:  COLACE  Take 1 capsule (100 mg total) by mouth 2 (two) times daily. Continue this while taking narcotics to help with bowel movements     famotidine-calcium carbonate-magnesium hydroxide 10-800-165 MG Chew chewable tablet  Commonly known as:  PEPCID COMPLETE  Chew 1 tablet by mouth daily as needed (heartburn).     Garlic 0000000 MG Caps  Take 600 mg by mouth daily.     Glucosamine Sulfate 500 MG Caps  Take 500 mg by mouth daily.     lisinopril 20 MG tablet  Commonly known as:  PRINIVIL,ZESTRIL  Take 20 mg by mouth daily.     methocarbamol 500 MG tablet  Commonly known as:  ROBAXIN  Take 1 tablet (500 mg total) by mouth every 6 (six) hours as needed for muscle spasms.     multivitamin with minerals tablet  Take 1 tablet by mouth daily.     NASAL SPRAY NA  Place 1 spray into both nostrils at bedtime as needed (congestion).     OVER THE COUNTER MEDICATION  Lecithin 20 mg with 30 i.u. Vitamin E     OVER THE COUNTER MEDICATION  Take 1 tablet by mouth daily. Glucose health     oxyCODONE 5 MG immediate release tablet  Commonly known as:  ROXICODONE  Take 2 tablets (10 mg total) by mouth every 4 (four) hours as needed for severe pain.     oxyCODONE-acetaminophen 10-325 MG per tablet  Commonly known as:   PERCOCET  Take 1-2 tablets by mouth every 4 (four) hours as needed. For pain     rifampin 300 MG capsule  Commonly known as:  RIFADIN  Take 2 capsules (600 mg total) by mouth daily.     tadalafil 5 MG tablet  Commonly known as:  CIALIS  Take 2.5 mg  by mouth daily after supper.     tamsulosin 0.4 MG Caps capsule  Commonly known as:  FLOMAX  Take 0.4 mg by mouth daily before breakfast.     verapamil 240 MG CR tablet  Commonly known as:  CALAN-SR  Take 240 mg by mouth daily before breakfast.     vitamin C 250 MG tablet  Commonly known as:  ASCORBIC ACID  Take 250 mg by mouth daily.     zolpidem 5 MG tablet  Commonly known as:  AMBIEN  Take 2.5 mg by mouth at bedtime as needed for sleep.        Diagnostic Studies: Dg Knee 1-2 Views Left  2013-11-06   CLINICAL DATA:  Prior knee replacement.  EXAM: LEFT KNEE - 1-2 VIEW  COMPARISON:  None.  FINDINGS: Diffuse soft-tissue swelling is noted. Surgical drainage tube noted. Prostheses appear in good anatomic alignment.  IMPRESSION: Patient status postop left knee replacement. Good anatomic alignment noted. On the tibial series, subtle fracture lucency noted adjacent to the distal tip of the tibial prosthesis is again noted. This was better demonstrated on prior study of 10/02/2013.   Electronically Signed   By: Marcello Moores  Register   On: 11/06/2013 13:10   Dg Knee 1-2 Views Left  10/02/2013   CLINICAL DATA:  Status post revision of knee replacement.  EXAM: LEFT KNEE - 1-2 VIEW  COMPARISON:  None.  FINDINGS: Left total knee arthroplasty is in place. The device is located. There is a linear lucency seen on the lateral view only just below the inferior margin of the tibial stem which could represent a nondisplaced fracture. Gas the soft tissues and surgical staples are noted. A drain is in place.  IMPRESSION: Left knee replacement. Cortical lucency just distal to the tibial stem could represent a nondisplaced fracture. Recommend attention on  follow-up examinations.  Critical Value/emergent results were called by telephone at the time of interpretation on 10/02/2013 at 4:43 PM to Dr. Shane Crutch , who verbally acknowledged these results.   Electronically Signed   By: Inge Rise M.D.   On: 10/02/2013 16:44   Dg Tibia/fibula Left  11/06/2013   CLINICAL DATA:  Infection.  EXAM: LEFT TIBIA AND FIBULA - 2 VIEW  COMPARISON:  10/02/2013.  FINDINGS: Subtle fracture along the distal tip of the tibial prosthesis noted. This is better demonstrated on full tibial series of 10/02/2013. No new bony abnormality identified. Tibial prostheses is in stable position. Diffuse soft tissue swelling.  IMPRESSION: 1. Subtle fracture noted in the proximal tibia distal to the tibial prosthesis, this is better demonstrated on full tibial series of 10/02/2013. 2. Diffuse soft tissue swelling.  No new bony abnormality.   Electronically Signed   By: Marcello Moores  Register   On: 11/06/13 13:05   Dg Tibia/fibula Left  10/02/2013   CLINICAL DATA:  Knee pain.  EXAM: LEFT TIBIA AND FIBULA - 2 VIEW  COMPARISON:  10/02/2013.  FINDINGS: The femoral and tibial components appear well seated. There is a small cortical fracture extending down from the tip of the prosthesis approximately 3.8 cm. This is best seen on the lateral films.  IMPRESSION: Small cortical fracture extending from the tip of the tibial prosthesis.   Electronically Signed   By: Kalman Jewels M.D.   On: 10/02/2013 19:59    Disposition: 81-Discharged to home/self-care with a planned acute care hospital inpt readmission      Discharge Orders   Future Appointments Provider Department Dept Phone   11/06/2013  9:00 AM Campbell Riches, MD [facility] [phone]   Future Orders Complete By Expires   Weight bearing as tolerated  As directed    Questions:     Laterality:     Extremity:        Follow-up Information   Follow up with [facility], MD. Schedule an  appointment as soon as possible for a visit in 1 week.   Specialty:  Orthopedic Surgery   Contact information:   [address] [place] [address] [phone]        Signed: Larae Grooms 10/30/2013, 9:31 AM

## 2013-10-30 NOTE — Discharge Summary (Signed)
I participated in the care of this patient and agree with the above history, physical and evaluation. I performed a review of the history and a physical exam as detailed   Jaque Dacy Daniel Jaevon Paras MD  

## 2013-11-05 ENCOUNTER — Ambulatory Visit (INDEPENDENT_AMBULATORY_CARE_PROVIDER_SITE_OTHER): Payer: Medicare Other | Admitting: Infectious Diseases

## 2013-11-05 ENCOUNTER — Encounter (INDEPENDENT_AMBULATORY_CARE_PROVIDER_SITE_OTHER): Payer: Self-pay

## 2013-11-05 ENCOUNTER — Encounter: Payer: Self-pay | Admitting: Infectious Diseases

## 2013-11-05 VITALS — BP 164/83 | HR 82 | Temp 98.1°F | Ht 70.5 in | Wt 252.0 lb

## 2013-11-05 DIAGNOSIS — R7881 Bacteremia: Secondary | ICD-10-CM

## 2013-11-05 DIAGNOSIS — T8454XA Infection and inflammatory reaction due to internal left knee prosthesis, initial encounter: Secondary | ICD-10-CM

## 2013-11-05 DIAGNOSIS — Z96659 Presence of unspecified artificial knee joint: Secondary | ICD-10-CM

## 2013-11-05 DIAGNOSIS — T8450XA Infection and inflammatory reaction due to unspecified internal joint prosthesis, initial encounter: Secondary | ICD-10-CM

## 2013-11-05 DIAGNOSIS — T8459XA Infection and inflammatory reaction due to other internal joint prosthesis, initial encounter: Secondary | ICD-10-CM

## 2013-11-05 DIAGNOSIS — B958 Unspecified staphylococcus as the cause of diseases classified elsewhere: Secondary | ICD-10-CM

## 2013-11-05 DIAGNOSIS — M869 Osteomyelitis, unspecified: Secondary | ICD-10-CM

## 2013-11-05 MED ORDER — RIFAMPIN 300 MG PO CAPS: 300.0000 mg | ORAL_CAPSULE | Freq: Every day | ORAL | Status: DC

## 2013-11-05 NOTE — Assessment & Plan Note (Signed)
His repeat BCx were (-). Will defer rechecking these til he completes IV anbx.

## 2013-11-05 NOTE — Assessment & Plan Note (Signed)
He appears to be doing well. Will start him on the previously planned rifampin in addition to his ancef. He will complete his anbx on 11-28-13 and we will see him back then. He will have his PIC pulled by [place] at that time. Upon f/u will make plans for him to be on prolonged (~ 6 months) oral therapy. We are calling [facility] to get weekly labs sent to [place] as well as to check a CRP and ESR today.

## 2013-11-05 NOTE — Progress Notes (Signed)
   Subjective:    Patient ID: Marc Maldonado, male    DOB: [birth year], 68 y.o.   MRN: [id number]  HPI 68 y.o. male with degenerative joint disease who had left total knee arthroplasty in 2003. He developed loosening of prosthesis with severe, debilitating pain. He underwent revision arthroplasty on October 02, 2013. He initially did well postoperatively but on 12-23 days he developed sudden, severe pain and swelling of his left knee. He then began to notice wound drainage and low-grade fever.He was taken to the [facility] where it appears his BCx were MSSA. He was brought to [facility] and underwent I & D with poly exchange on 12-26. His Cx grew MSSA and he was d/c home on ancef with planned stop date of 11-28-13.  He had repeat BCx 10-19-13 (-). TTE that did not mention evidence of endocarditis. He was d/c home on 10-22-13.  Still having swelling of L knee. Still aching (pain is 2, occas 4-5). Also having some sharp pain. Has not been on rifampin.  No problems with PIC line.   Review of Systems  Constitutional: Negative for appetite change and unexpected weight change.  Gastrointestinal: Positive for constipation. Negative for diarrhea.  Genitourinary: Negative for difficulty urinating.       Objective:   Physical Exam  Constitutional: He appears well-developed and well-nourished.  HENT:  Mouth/Throat: No oropharyngeal exudate.  Eyes: EOM are normal. Pupils are equal, round, and reactive to light.  Neck: Neck supple.  Cardiovascular: Normal rate, regular rhythm and normal heart sounds.   Pulmonary/Chest: Effort normal and breath sounds normal.  Abdominal: Soft. Bowel sounds are normal. He exhibits no distension. There is no tenderness.  Musculoskeletal:       Arms:      Legs: Lymphadenopathy:    He has no cervical adenopathy.          Assessment & Plan:

## 2013-11-06 ENCOUNTER — Inpatient Hospital Stay: Payer: Medicare Other | Admitting: Infectious Diseases

## 2013-11-07 ENCOUNTER — Telehealth: Payer: Self-pay | Admitting: *Deleted

## 2013-11-07 NOTE — Telephone Encounter (Signed)
Patient's wife called, he has been hypertensive for 2 days.  This morning's reading was 206/105 and 209/107.  RN called back, advised patient to contact his PCP or an urgent care for assistance in lowering his pressure.  Patient was already at the urgent care when I called.  Wife agreed to follow up with PCP for additional monitoring/medication changes. Landis Gandy, RN

## 2013-11-21 ENCOUNTER — Telehealth: Payer: Self-pay | Admitting: Licensed Clinical Social Worker

## 2013-11-21 NOTE — Telephone Encounter (Signed)
error 

## 2013-11-28 ENCOUNTER — Encounter: Payer: Self-pay | Admitting: Infectious Diseases

## 2013-11-28 ENCOUNTER — Ambulatory Visit (INDEPENDENT_AMBULATORY_CARE_PROVIDER_SITE_OTHER): Payer: Medicare Other | Admitting: Infectious Diseases

## 2013-11-28 VITALS — BP 167/91 | HR 71 | Temp 98.0°F | Ht 69.5 in | Wt 229.0 lb

## 2013-11-28 DIAGNOSIS — T8450XA Infection and inflammatory reaction due to unspecified internal joint prosthesis, initial encounter: Secondary | ICD-10-CM

## 2013-11-28 DIAGNOSIS — T8459XA Infection and inflammatory reaction due to other internal joint prosthesis, initial encounter: Secondary | ICD-10-CM

## 2013-11-28 DIAGNOSIS — Z96659 Presence of unspecified artificial knee joint: Secondary | ICD-10-CM

## 2013-11-28 MED ORDER — CEPHALEXIN 500 MG PO CAPS: 500.0000 mg | ORAL_CAPSULE | Freq: Two times a day (BID) | ORAL | Status: DC

## 2013-11-28 NOTE — Assessment & Plan Note (Signed)
Will change him to keflex 500mg  bid, continue rifampin 300mg  daily. Will get his PIC pulled at home health today. Will [facility] from [facility] today. Will see him back in 3 months unless he has problems in intervening period.

## 2013-11-28 NOTE — Progress Notes (Signed)
   Subjective:    Patient ID: Marc Maldonado, male    DOB: [birth year], 68 y.o.   MRN: [id number]  HPI 68 y.o. male with degenerative joint disease who had left total knee arthroplasty in 2003. He developed loosening of prosthesis with severe, debilitating pain. He underwent revision arthroplasty on October 02, 2013. He initially did well postoperatively but on 12-23 days he developed sudden, severe pain and swelling of his left knee. He then began to notice wound drainage and low-grade fever.He was taken to the [facility] where it appears his BCx were MSSA. He was brought to [facility] and underwent I & D with poly exchange on 12-26. His Cx grew MSSA. He had repeat BCx 10-19-13 (-). TTE that did not mention evidence of endocarditis. He was d/c home on Ancef, on 10-22-13. Planned stop date of 11-28-13.  He had ID f/u on 11-05-13 and rifampin was added back to his rx.  Has been ambulating with cane, still having pain (1-2). Occasionally sharp pain. No problems taking rifampin. Has been taking rif 300mg  daily. Has swelling in his Leg/knee/foot by end of day. By next AM has resolved.   Has been waking up with night sweats, needs to urinate 4-5x/night.  No problems with PIC line. Has had mild bloody d/c today.  Wt down 20#.   Review of Systems  Constitutional: Positive for unexpected weight change. Negative for appetite change.  Respiratory: Negative for shortness of breath.   Cardiovascular: Negative for chest pain.  Gastrointestinal: Negative for diarrhea and constipation.  Genitourinary: Negative for difficulty urinating.  Neurological: Negative for headaches.      Objective:   Physical Exam  Constitutional: He appears well-developed and well-nourished.  HENT:  Mouth/Throat: No oropharyngeal exudate.  Eyes: EOM are normal. Pupils are equal, round, and reactive to light.  Neck: Neck supple.  Cardiovascular: Normal rate, regular rhythm and normal heart sounds.   Pulmonary/Chest: Effort  normal and breath sounds normal.  Abdominal: Soft. Bowel sounds are normal. There is no tenderness.  Musculoskeletal:       Arms:      Legs: Lymphadenopathy:    He has no cervical adenopathy.          Assessment & Plan:

## 2013-11-29 ENCOUNTER — Telehealth: Payer: Self-pay | Admitting: *Deleted

## 2013-11-29 NOTE — Telephone Encounter (Signed)
Melissa with [facility] called to advise that she is D/C the patient as of today. She advised she pulled the PICC 11/28/13 and the patient has no need for them and she just wanted the doctor to know. Advised her will add to the patient chart.

## 2013-12-18 ENCOUNTER — Encounter: Payer: Self-pay | Admitting: Infectious Diseases

## 2013-12-26 ENCOUNTER — Other Ambulatory Visit: Payer: Self-pay | Admitting: Infectious Diseases

## 2014-02-11 ENCOUNTER — Encounter: Payer: Self-pay | Admitting: Infectious Diseases

## 2014-02-26 ENCOUNTER — Ambulatory Visit (INDEPENDENT_AMBULATORY_CARE_PROVIDER_SITE_OTHER): Payer: Medicare Other | Admitting: Infectious Diseases

## 2014-02-26 ENCOUNTER — Encounter: Payer: Self-pay | Admitting: Infectious Diseases

## 2014-02-26 VITALS — BP 155/86 | HR 80 | Temp 98.0°F | Ht 70.0 in | Wt 231.0 lb

## 2014-02-26 DIAGNOSIS — I82A11 Acute embolism and thrombosis of right axillary vein: Secondary | ICD-10-CM | POA: Insufficient documentation

## 2014-02-26 DIAGNOSIS — I82A19 Acute embolism and thrombosis of unspecified axillary vein: Secondary | ICD-10-CM

## 2014-02-26 DIAGNOSIS — Z96659 Presence of unspecified artificial knee joint: Secondary | ICD-10-CM

## 2014-02-26 DIAGNOSIS — T8450XA Infection and inflammatory reaction due to unspecified internal joint prosthesis, initial encounter: Secondary | ICD-10-CM

## 2014-02-26 DIAGNOSIS — T8459XA Infection and inflammatory reaction due to other internal joint prosthesis, initial encounter: Secondary | ICD-10-CM

## 2014-02-26 MED ORDER — CEPHALEXIN 500 MG PO CAPS: 500.0000 mg | ORAL_CAPSULE | Freq: Two times a day (BID) | ORAL | Status: AC

## 2014-02-26 NOTE — Assessment & Plan Note (Signed)
Will stop his rifampin due to drug interactions. He will call his PCP to let them know this and that his coumdin dose will need to be changed and more closely monitored.

## 2014-02-26 NOTE — Progress Notes (Signed)
   Subjective:    Patient ID: Marc Maldonado, male    DOB: 1946-05-28, 68 y.o.   MRN: [id number]  HPI 68 y.o. male with degenerative joint disease who had left total knee arthroplasty in 2003. He developed loosening of prosthesis with severe, debilitating pain. He underwent revision arthroplasty on October 02, 2013. He initially did well postoperatively but on 12-23 days he developed sudden, severe pain and swelling of his left knee. He then began to notice wound drainage and low-grade fever.He was taken to the [facility] where BCx were MSSA. He was brought to [facility] and underwent I & D with poly exchange on 12-26. His Cx grew MSSA. He had repeat BCx 10-19-13 (-). TTE that did not mention evidence of endocarditis. He was d/c home on Ancef, on 10-22-13.  He had ID f/u on 11-05-13 and rifampin was added back to his rx. He had his PIC pulled on 11-28-13 and was transitioned to keflex/rifampin.  ESR 13 and CRP 2.9 (11-28-13). He had blood clot in his RUE from his PIC. Was started on coumadin after seen at [facility].   Still having some pain, swelling has improved. Still going to PT BIW. No heat. Can walk fine, some pain with walking up and down stairs.   Review of Systems  Constitutional: Negative for fever and chills.  Genitourinary: Positive for frequency.  Psychiatric/Behavioral: Positive for sleep disturbance.       Objective:   Physical Exam  Constitutional: He appears well-developed and well-nourished.  Musculoskeletal:       Arms:      Legs:         Assessment & Plan:

## 2014-02-26 NOTE — Assessment & Plan Note (Signed)
He is doing well. His prev ESR and CRP were normal (by home health lab ranges). He will stop rifampin (due to coumadin interactions) and take keflex for 3 more months (6 months total). He will call if he has any problems. Will f/u prn, I offered to see him back in 1 year but he defered.

## 2015-07-21 ENCOUNTER — Ambulatory Visit (INDEPENDENT_AMBULATORY_CARE_PROVIDER_SITE_OTHER): Payer: Medicare Other | Admitting: Internal Medicine

## 2015-07-21 ENCOUNTER — Encounter: Payer: Self-pay | Admitting: Internal Medicine

## 2015-07-21 VITALS — BP 106/60 | HR 44 | Temp 97.9°F | Ht 70.5 in | Wt 248.0 lb

## 2015-07-21 DIAGNOSIS — T8459XS Infection and inflammatory reaction due to other internal joint prosthesis, sequela: Secondary | ICD-10-CM

## 2015-07-21 DIAGNOSIS — T8450XS Infection and inflammatory reaction due to unspecified internal joint prosthesis, sequela: Secondary | ICD-10-CM | POA: Diagnosis not present

## 2015-07-21 DIAGNOSIS — Z96659 Presence of unspecified artificial knee joint: Principal | ICD-10-CM

## 2015-07-21 MED ORDER — CEPHALEXIN 500 MG PO CAPS: 500.0000 mg | ORAL_CAPSULE | Freq: Two times a day (BID) | ORAL | Status: DC

## 2015-07-21 NOTE — Progress Notes (Signed)
[facility]      Reason for Consult: prosthetic joint infection    Referring Physician: Dr. Noemi Chapel    Patient ID: Marc Maldonado, male    DOB: 02-06-1946, 69 y.o.   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  He has a history of bilateral knee replacement, left in 2003 and right in 2008.  Due to wear, right and left knee were revised in Dec 2014.  He then developed sudden severe pain and swelling of left knee and went to [facility] and blood culture positive for MSSA.  He then went to [facility] for operative treatment and underwent I and D with polyexchange of left in Dec 2014 and placed on 6 weeks of Ancef followed by keflex with rifampin added after finishing Iv Ancef.  He then continue keflex/rif for 3 months then 3 more months of Keflex and stopped.  ESR and CRP normalized.  He then did well but had some pinching and catching from synovitis and arthroscopy with synovectomy  Done in Feb 2016 and no signs of infection.  Following a dental procedure in April 2016 he developed redness and swelling at distal aspect of knee and swelling and ESR 49, CRP 11.9 and aspiration with 21,000 WBCs but negative culture and gram stain.  He was placed on suppressive Bactrim and did well but taken off again and had same problem.  He came back to Dr Noemi Chapel of orthopedics with pain/pinching hoping for surgical options.  He also has had issues with increased creatinine on Bactrim and stopped Bactrim.  Dr. Noemi Chapel feels that the next option is 2 stage revision and hopes to avoid that.  So he is sent here to change antibiotics for chronic suppression.     Cultures from [facility] are MSSA, tetracycline, oxacillin, TMP/SMX sensitive.   He has already seen ID at the [facility], Dr. Prudencio Pair  Past Medical History  Diagnosis Date  . BPH (benign prostatic hyperplasia)   . Wears glasses   . Hypertension   . GERD (gastroesophageal reflux disease)    occas. indigestion   . Chronic kidney disease     renal stone-(2) tumors removed fr. bladder  . 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 Arthritis     low back & neck,- DDD  . DJD (degenerative joint disease)     Prior to Admission medications   Medication Sig Start Date End Date Taking? Authorizing Provider  Calcium-Magnesium-Vitamin D [phone] MG-MG-UNIT TABS Take 1 tablet by mouth daily.    Historical Provider, MD  cephALEXin (KEFLEX) 500 MG capsule Take 1 capsule (500 mg total) by mouth 2 (two) times daily. 07/21/15   Thayer Headings, MD  cholecalciferol (VITAMIN D) 1000 UNITS tablet Take 1,000 Units by mouth daily.    Historical Provider, MD  diphenhydramine-acetaminophen (TYLENOL PM) 25-500 MG TABS Take 2 tablets by mouth at bedtime as needed (sleep/pain).     Historical Provider, MD  docusate sodium (COLACE) 100 MG capsule Take 1 capsule (100 mg total) by mouth 2 (two) times daily. Continue this while taking narcotics to help with bowel movements 10/18/13   Renette Butters, MD  famotidine-calcium carbonate-magnesium hydroxide (PEPCID COMPLETE) 10-800-165 MG CHEW chewable tablet Chew 1 tablet by mouth daily as needed (heartburn).     Historical Provider, MD  lisinopril-hydrochlorothiazide (PRINZIDE,ZESTORETIC) 20-12.5 MG per tablet  02/13/14  Historical Provider, MD  metoprolol tartrate (LOPRESSOR) 25 MG tablet  02/13/14   Historical Provider, MD  Naproxen Sodium (ALEVE) 220 MG CAPS Take 440 mg by mouth daily as needed (pain).    Historical Provider, MD  oxyCODONE-acetaminophen (PERCOCET) 10-325 MG per tablet Take 1-2 tablets by mouth every 4 (four) hours as needed. For pain 07/25/13   Historical Provider, MD  Oxymetazoline HCl (NASAL SPRAY NA) Place 1 spray into both nostrils at bedtime as needed (congestion).    Historical Provider, MD  tadalafil (CIALIS) 5 MG tablet Take 2.5 mg by mouth daily after supper.     Historical Provider, MD    Tamsulosin HCl (FLOMAX) 0.4 MG CAPS Take 0.4 mg by mouth daily before breakfast.     Historical Provider, MD  verapamil (CALAN-SR) 240 MG CR tablet Take 240 mg by mouth daily before breakfast.     Historical Provider, MD  vitamin C (ASCORBIC ACID) 250 MG tablet Take 250 mg by mouth daily.    Historical Provider, MD  warfarin (COUMADIN) 5 MG tablet Take 20-25 mg by mouth daily. Take 4 tablets daily except Mondays and Wednesdays - take 5 tablets    Historical Provider, MD  zolpidem (AMBIEN) 5 MG tablet Take 2.5 mg by mouth at bedtime as needed for sleep.     Historical Provider, MD    No Known Allergies  Social History  Substance Use Topics  . Smoking status: Never Smoker   . Smokeless tobacco: Never Used  . Alcohol Use: No    FMHx: no arthritis   Review of Systems A comprehensive review of systems was negative except for: Musculoskeletal: positive for arthralgias   Filed Vitals:   07/21/15 1008  BP: 106/60  Pulse: 44  Temp: 97.9 F (36.6 C)   in no apparent distress and alert HEENT: anicteric Cor RRR and No murmurs clear Bowel sounds are normal, liver is not enlarged, spleen is not enlarged peripheral pulses normal, no pedal edema, no clubbing or cyanosis negative for - jaundice, spider hemangioma, telangiectasia, palmar erythema, ecchymosis and atrophy Musculoskeletal: left knee is somewhat warm, no effusion appreciated Lymphadenopathy: no cervical or supraclavicular lad  Labs: Lab Results  Component Value Date   WBC 5.7 10/22/2013   HGB 7.8* 10/22/2013   HCT 22.8* 10/22/2013   MCV 95.8 10/22/2013   PLT 164 10/22/2013    Lab Results  Component Value Date   CREATININE 1.16 10/22/2013   BUN 22 10/22/2013   NA 139 10/22/2013   K 3.6* 10/22/2013   CL 102 10/22/2013   CO2 24 10/22/2013    Lab Results  Component Value Date   ALT 15 10/17/2013   AST 16 10/17/2013   ALKPHOS 63 10/17/2013   BILITOT 1.2 10/17/2013   INR 1.92* 10/17/2013     Assessment:  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  I discussed options with him including chronic suppression vs surgical 2 stage discussed with Dr. Noemi Chapel which doesn't seem to be an optimal options.    Plan: 1) will use suppressive doses of keflex at 500 mg bid 2) discussed with patient options since he is most interested in surgery and to discuss again with Dr. Noemi Chapel.  I did discuss that if he does undergo 2 stage revision, he will again need IV antibiotics for cure.   He will follow up here as needed, I will send my report to Dr. Prudencio Pair at the [facility]  Thank you for consult.

## 2015-07-28 ENCOUNTER — Other Ambulatory Visit: Payer: Self-pay | Admitting: Physician Assistant

## 2015-07-30 ENCOUNTER — Other Ambulatory Visit: Payer: Self-pay | Admitting: Physician Assistant

## 2015-07-30 NOTE — H&P (Signed)
TOTAL KNEE REVISION ADMISSION H&P  Patient is being admitted for left revision total knee arthroplasty.  Subjective:  Chief Complaint:left knee pain.  HPI: Marc Maldonado, 69 y.o. male, has a history of pain and functional disability in the left knee(s) due to infection and patient has failed non-surgical conservative treatments for greater than 12 weeks to include NSAID's and/or analgesics, corticosteriod injections and use of assistive devices. The indications for the revision of the total knee arthroplasty are history of total knee infection. Onset of symptoms was gradual starting 2 years ago with rapidlly worsening course since that time.  Prior procedures on the left knee(s) include arthroplasty.  Patient currently rates pain in the left knee(s) at 6 out of 10 with activity. There is night pain, worsening of pain with activity and weight bearing, pain that interferes with activities of daily living, pain with passive range of motion and joint swelling.   This condition presents safety issues increasing the risk of falls. This patient has had infection.  There is no current active infection.  Patient Active Problem List   Diagnosis Date Noted  . DVT of axillary vein, acute right 02/26/2014  . Postoperative anemia due to acute blood loss 10/20/2013  . Normocytic anemia 10/18/2013  . Acute renal insufficiency 10/18/2013  . Hyperglycemia 10/18/2013  . Bacteremia due to Staphylococcus 10/18/2013  . 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 DJD (degenerative joint disease) of knee 10/02/2013  . Essential hypertension, benign 09/23/2013  . GERD (gastroesophageal reflux disease) 09/23/2013  . Failed total left knee replacement ([place]) 09/23/2013   Past Medical History  Diagnosis Date  . BPH (benign prostatic hyperplasia)   . Wears glasses   . Hypertension   . GERD (gastroesophageal reflux disease)     occas. indigestion   . Chronic kidney disease     renal  stone-(2) tumors removed fr. bladder  . 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 Arthritis     low back & neck,- DDD  . DJD (degenerative joint disease)     Past Surgical History  Procedure Laterality Date  . Total knee arthroplasty  2008    rt  . Total knee arthroplasty  2003    left  . Tonsillectomy    . Carpal tunnel release  2012    rt and lt  . Hernia repair  2007    umb hr  . Cystoscopy with biopsy      bladder tumor  . Colonoscopy    . Shoulder arthroscopy w/ rotator cuff repair  2010 & 2013    bilateral   . Total knee revision Right 07/24/2013    Procedure: TOTAL KNEE REVISION;  Surgeon: [name], MD;  Location: [facility];  Service: Orthopedics;  Laterality: Right;  . Total knee revision Left 10/02/2013    Procedure: TOTAL KNEE REVISION, tibial component;  Surgeon: [name], MD;  Location: [facility];  Service: Orthopedics;  Laterality: Left;  . Total knee revision Left 10/18/13  . I&d knee with poly exchange Left 10/18/2013    Procedure: IRRIGATION AND DEBRIDEMENT KNEE WITH POLY EXCHANGE;  Surgeon: Renette Butters, MD;  Location: [facility];  Service: Orthopedics;  Laterality: Left;     (Not in a hospital admission) No Known Allergies  Social History  Substance Use Topics  . Smoking status: Never Smoker   . Smokeless tobacco: Never Used  .  Alcohol Use: No    No family history on file.    Review of Systems  Constitutional: Negative.   HENT: Negative.   Eyes: Negative.   Respiratory: Negative.   Cardiovascular: Negative.   Gastrointestinal: Negative.   Genitourinary: Negative.   Musculoskeletal: Positive for joint pain.  Skin: Negative.   Neurological: Negative.   Endo/Heme/Allergies: Bruises/bleeds easily.  Psychiatric/Behavioral: Negative.      Objective:  Physical Exam  Constitutional: He is oriented to person, place, and time. He appears well-developed and well-nourished.  HENT:  Head:  Normocephalic and atraumatic.  Eyes: EOM are normal. Pupils are equal, round, and reactive to light.  Neck: Normal range of motion. Neck supple.  Cardiovascular: Normal rate and regular rhythm.   Respiratory: Effort normal and breath sounds normal.  GI: Soft. Bowel sounds are normal.  Musculoskeletal:  He does have an effusion of the left knee, but it is not that marked.  All of his erythema and swelling at the distal end of the incision is pretty much gone.  Neurovascularly intact distally.  His knee motion is 0-120.  Good stability  Neurological: He is alert and oriented to person, place, and time.  Skin: Skin is warm and dry.  Psychiatric: He has a normal mood and affect. His behavior is normal. Judgment and thought content normal.    Vital signs in last 24 hours: @VSRANGES @  Labs:  Estimated body mass index is 35.07 kg/(m^2) as calculated from the following:   Height as of 07/21/15: 5' 10.5" (1.791 m).   Weight as of 07/21/15: 112.492 kg (248 lb).  Imaging Review Plain radiographs demonstrate moderate degenerative joint disease of the left knee(s). The overall alignment is neutral. components. The bone quality appears to be fair for age and reported activity level.  Assessment/Plan:  End stage arthritis, left knee(s) with failed previous arthroplasty.   The patient history, physical examination, clinical judgment of the provider and imaging studies are consistent with end stage degenerative joint disease of the left knee(s), previous total knee arthroplasty. Revision total knee arthroplasty is deemed medically necessary. The treatment options including medical management, injection therapy, arthroscopy and revision arthroplasty were discussed at length. The risks and benefits of revision total knee arthroplasty were presented and reviewed. The risks due to aseptic loosening, infection, stiffness, patella tracking problems, thromboembolic complications and other imponderables were  discussed. The patient acknowledged the explanation, agreed to proceed with the plan and consent was signed. Patient is being admitted for inpatient treatment for surgery, pain control, PT, OT, prophylactic antibiotics, VTE prophylaxis, progressive ambulation and ADL's and discharge planning.The patient is planning to be discharged home with home health services

## 2015-07-31 ENCOUNTER — Encounter (HOSPITAL_COMMUNITY)
Admission: RE | Admit: 2015-07-31 | Discharge: 2015-07-31 | Disposition: A | Discharge status: Home / Self Care | Payer: Medicare Other | Source: Ambulatory Visit | Attending: Orthopedic Surgery | Admitting: Orthopedic Surgery

## 2015-07-31 DIAGNOSIS — K219 Gastro-esophageal reflux disease without esophagitis: Secondary | ICD-10-CM | POA: Insufficient documentation

## 2015-07-31 DIAGNOSIS — I440 Atrioventricular block, first degree: Secondary | ICD-10-CM | POA: Diagnosis not present

## 2015-07-31 DIAGNOSIS — N189 Chronic kidney disease, unspecified: Secondary | ICD-10-CM | POA: Insufficient documentation

## 2015-07-31 DIAGNOSIS — Z85038 Personal history of other malignant neoplasm of large intestine: Secondary | ICD-10-CM | POA: Diagnosis not present

## 2015-07-31 DIAGNOSIS — R001 Bradycardia, unspecified: Secondary | ICD-10-CM | POA: Insufficient documentation

## 2015-07-31 DIAGNOSIS — Z0183 Encounter for blood typing: Secondary | ICD-10-CM | POA: Diagnosis not present

## 2015-07-31 DIAGNOSIS — I129 Hypertensive chronic kidney disease with stage 1 through stage 4 chronic kidney disease, or unspecified chronic kidney disease: Secondary | ICD-10-CM | POA: Insufficient documentation

## 2015-07-31 DIAGNOSIS — Z01812 Encounter for preprocedural laboratory examination: Secondary | ICD-10-CM | POA: Diagnosis not present

## 2015-07-31 DIAGNOSIS — Z96653 Presence of artificial knee joint, bilateral: Secondary | ICD-10-CM | POA: Insufficient documentation

## 2015-07-31 DIAGNOSIS — Z01818 Encounter for other preprocedural examination: Secondary | ICD-10-CM | POA: Insufficient documentation

## 2015-07-31 LAB — COMPREHENSIVE METABOLIC PANEL
ALT: 19 U/L (ref 17–63)
AST: 24 U/L (ref 15–41)
Albumin: 3.6 g/dL (ref 3.5–5.0)
Alkaline Phosphatase: 71 U/L (ref 38–126)
Anion gap: 9 (ref 5–15)
BILIRUBIN TOTAL: 0.5 mg/dL (ref 0.3–1.2)
BUN: 15 mg/dL (ref 6–20)
CHLORIDE: 103 mmol/L (ref 101–111)
CO2: 29 mmol/L (ref 22–32)
CREATININE: 1.29 mg/dL — AB (ref 0.61–1.24)
Calcium: 9.3 mg/dL (ref 8.9–10.3)
GFR, EST NON AFRICAN AMERICAN: 55 mL/min — AB (ref >60)
Glucose, Bld: 109 mg/dL — ABNORMAL HIGH (ref 65–99)
POTASSIUM: 3.6 mmol/L (ref 3.5–5.1)
Sodium: 141 mmol/L (ref 135–145)
TOTAL PROTEIN: 6.3 g/dL — AB (ref 6.5–8.1)

## 2015-07-31 LAB — CBC WITH DIFFERENTIAL/PLATELET
BASOS ABS: 0 10*3/uL (ref 0.0–0.1)
Basophils Relative: 1 %
EOS PCT: 1 %
Eosinophils Absolute: 0.1 10*3/uL (ref 0.0–0.7)
HEMATOCRIT: 35.5 % — AB (ref 39.0–52.0)
Hemoglobin: 11.8 g/dL — ABNORMAL LOW (ref 13.0–17.0)
LYMPHS ABS: 1.2 10*3/uL (ref 0.7–4.0)
LYMPHS PCT: 17 %
MCH: 31.9 pg (ref 26.0–34.0)
MCHC: 33.2 g/dL (ref 30.0–36.0)
MCV: 95.9 fL (ref 78.0–100.0)
MONO ABS: 0.4 10*3/uL (ref 0.1–1.0)
Monocytes Relative: 6 %
NEUTROS ABS: 5.2 10*3/uL (ref 1.7–7.7)
Neutrophils Relative %: 75 %
Platelets: 210 10*3/uL (ref 150–400)
RBC: 3.7 MIL/uL — ABNORMAL LOW (ref 4.22–5.81)
RDW: 13.3 % (ref 11.5–15.5)
WBC: 7 10*3/uL (ref 4.0–10.5)

## 2015-07-31 LAB — TYPE AND SCREEN
ABO/RH(D): A POS
Antibody Screen: NEGATIVE

## 2015-07-31 LAB — SURGICAL PCR SCREEN
MRSA, PCR: NEGATIVE
Staphylococcus aureus: NEGATIVE

## 2015-07-31 LAB — PROTIME-INR
INR: 1.31 (ref 0.00–1.49)
PROTHROMBIN TIME: 16.4 s — AB (ref 11.6–15.2)

## 2015-07-31 LAB — APTT: aPTT: 29 seconds (ref 24–37)

## 2015-07-31 MED ORDER — CHLORHEXIDINE GLUCONATE 4 % EX LIQD: 60.0000 mL | Freq: Once | CUTANEOUS | Status: DC

## 2015-07-31 NOTE — Pre-Procedure Instructions (Addendum)
    MACKEY VARRICCHIO  07/31/2015      [id number] - [address], [place] [address] [place] [address] Phone: [phone] Fax: [phone]  [id number] - [place], [place] [place] [address] Phone: [phone] Fax: [phone]    Your procedure is scheduled on August 12, 2015.  Report to [facility] Admitting at 6:30 A.M.  Call this number if you have problems the morning of surgery:  [phone]   Remember:  Do not eat food or drink liquids after midnight Tuesday.  Take these medicines the morning of surgery with A SIP OF WATER: cephALEXin (KEFLEX), ,  metoprolol tartrate (LOPRESSOR), Tamsulosin HCl (FLOMAX)    If needed: oxyCODONE-acetaminophen (PERCOCET), famotidine-calcium  carbonate-magnesium hydroxide (PEPCID COMPLETE)   STOP ALEVE, HERBAL MEDICATIONS ONE WEEK PRIOR TO SURGERY    Do not wear jewelry.  Do not wear lotions, powders, or colognes.  You may wear deodorant.  Men may shave face and neck.  Do not bring valuables to the hospital.  [facility] is not responsible for any belongings or valuables.  Contacts, dentures or bridgework may not be worn into surgery.  Leave your suitcase in the car.  After surgery it may be brought to your room.  For patients admitted to the hospital, discharge time will be determined by your treatment team.  Patients discharged the day of surgery will not be allowed to drive home.   Name and phone number of your driver:    Special instructions:  "Preparing for surgery"  Please read over the following fact sheets that you were given. Pain Booklet, Coughing and Deep Breathing, Blood Transfusion Information, Total Joint Packet and Surgical Site Infection Prevention

## 2015-07-31 NOTE — Progress Notes (Signed)
This patient scored at an elevated risk for obstructive sleep apnea using the STOP BANG TOOL during a pre surgical testing 

## 2015-08-02 LAB — URINE CULTURE: Culture: 40000

## 2015-08-03 ENCOUNTER — Ambulatory Visit: Payer: Self-pay | Admitting: Infectious Diseases

## 2015-08-03 NOTE — Progress Notes (Signed)
Anesthesia Chart Review: Patient is a 69 year old male scheduled for left TK revision on 08/12/15 by Dr. Kathryne Hitch.  History includes non-smoker, obesity (BMI 35.70), BPH, HTN, GERD, CKD renal stones/tumors removed), arthritis, DJD, colon cancer s/p resection (stage 1?), left TKR '03 s/p revision 10/02/13 and I&D 10/18/13, right TKR '08 with revision 07/24/13, right rotator cuff repair '10, left rotator cuff repair '13, bilateral CTR '13. 2015 ID notes indicate a history of RUE clot from his PICC line (discontinued 11/28/13). OSA screen was elevated at 4 or greater.   PCP is Dr. Suzanna Obey in [place]. Patient was referred to ID Dr. Linus Salmons on 07/21/15. Plan included:  Plan: 1) will use suppressive doses of keflex at 500 mg bid 2) discussed with patient options since he is most interested in surgery and to discuss again with Dr. Noemi Chapel. I did discuss that if he does undergo 2 stage revision, he will again need IV antibiotics for cure.  He will follow up here as needed, I will send my report to Dr. Prudencio Pair at the [facility] include ASA, Cartia XT, Keflex, Colace, Pepcid complete, 65 Fe, lisinopril, lisinopril-HCTZ, Lopressor, Cialis, Flomax, Ambien.  07/31/15 EKG: SB at 54 bpm, first degree AVB, LAD. No significant change since last tracing on 10/17/13.  10/21/13 Echo: Study Conclusions - Left ventricle: The cavity size was normal. Systolic function was normal. The estimated ejection fraction was in the range of 60% to 65%. Wall motion was normal; there were no regional wall motion abnormalities. - Aortic valve: Trivial regurgitation. - Mitral valve: Calcified annulus. - Atrial septum: There was an atrial septal aneurysm.  Preoperative labs noted. Urine culture showed 40,000 colonies of staphylococcus species (coagulase negative). Urine culture result called to Sherri at Dr. Debroah Loop office. She will have him review for any preoperative recommendations.  If no acute changes  then I would anticipate that he could proceed as planned from an anesthesia standpoint.  George Hugh [facility] Short Stay Center/Anesthesiology Phone [phone] 08/03/2015 3:39 PM

## 2015-08-12 ENCOUNTER — Inpatient Hospital Stay (HOSPITAL_COMMUNITY): Payer: Medicare Other | Admitting: Certified Registered Nurse Anesthetist

## 2015-08-12 ENCOUNTER — Inpatient Hospital Stay (HOSPITAL_COMMUNITY): Payer: Medicare Other

## 2015-08-12 ENCOUNTER — Inpatient Hospital Stay (HOSPITAL_COMMUNITY): Payer: Medicare Other | Admitting: Vascular Surgery

## 2015-08-12 ENCOUNTER — Inpatient Hospital Stay (HOSPITAL_COMMUNITY)
Admission: AD | Admit: 2015-08-12 | Discharge: 2015-08-14 | DRG: 464 | Disposition: A | Discharge status: Home Health | Payer: Medicare Other | Source: Ambulatory Visit | Attending: Orthopedic Surgery | Admitting: Orthopedic Surgery

## 2015-08-12 ENCOUNTER — Encounter (HOSPITAL_COMMUNITY): Payer: Self-pay | Admitting: Certified Registered Nurse Anesthetist

## 2015-08-12 ENCOUNTER — Encounter (HOSPITAL_COMMUNITY)
Admission: AD | Disposition: A | Discharge status: Home Health | Payer: Self-pay | Source: Ambulatory Visit | Attending: Orthopedic Surgery

## 2015-08-12 DIAGNOSIS — R339 Retention of urine, unspecified: Secondary | ICD-10-CM | POA: Diagnosis not present

## 2015-08-12 DIAGNOSIS — M179 Osteoarthritis of knee, unspecified: Secondary | ICD-10-CM | POA: Diagnosis present

## 2015-08-12 DIAGNOSIS — I10 Essential (primary) hypertension: Secondary | ICD-10-CM | POA: Diagnosis present

## 2015-08-12 DIAGNOSIS — Z9889 Other specified postprocedural states: Secondary | ICD-10-CM

## 2015-08-12 DIAGNOSIS — Z96653 Presence of artificial knee joint, bilateral: Secondary | ICD-10-CM | POA: Diagnosis present

## 2015-08-12 DIAGNOSIS — Z96652 Presence of left artificial knee joint: Secondary | ICD-10-CM

## 2015-08-12 DIAGNOSIS — M1712 Unilateral primary osteoarthritis, left knee: Secondary | ICD-10-CM | POA: Diagnosis present

## 2015-08-12 DIAGNOSIS — Z96659 Presence of unspecified artificial knee joint: Secondary | ICD-10-CM | POA: Insufficient documentation

## 2015-08-12 DIAGNOSIS — Y838 Other surgical procedures as the cause of abnormal reaction of the patient, or of later complication, without mention of misadventure at the time of the procedure: Secondary | ICD-10-CM | POA: Diagnosis present

## 2015-08-12 DIAGNOSIS — T8454XA Infection and inflammatory reaction due to internal left knee prosthesis, initial encounter: Secondary | ICD-10-CM | POA: Diagnosis present

## 2015-08-12 DIAGNOSIS — D62 Acute posthemorrhagic anemia: Secondary | ICD-10-CM | POA: Diagnosis not present

## 2015-08-12 DIAGNOSIS — B9561 Methicillin susceptible Staphylococcus aureus infection as the cause of diseases classified elsewhere: Secondary | ICD-10-CM | POA: Diagnosis present

## 2015-08-12 DIAGNOSIS — K219 Gastro-esophageal reflux disease without esophagitis: Secondary | ICD-10-CM | POA: Diagnosis present

## 2015-08-12 DIAGNOSIS — M1710 Unilateral primary osteoarthritis, unspecified knee: Secondary | ICD-10-CM | POA: Diagnosis present

## 2015-08-12 DIAGNOSIS — T8454XD Infection and inflammatory reaction due to internal left knee prosthesis, subsequent encounter: Secondary | ICD-10-CM | POA: Diagnosis not present

## 2015-08-12 DIAGNOSIS — T8450XA Infection and inflammatory reaction due to unspecified internal joint prosthesis, initial encounter: Secondary | ICD-10-CM | POA: Insufficient documentation

## 2015-08-12 DIAGNOSIS — A4901 Methicillin susceptible Staphylococcus aureus infection, unspecified site: Secondary | ICD-10-CM | POA: Insufficient documentation

## 2015-08-12 DIAGNOSIS — M25562 Pain in left knee: Secondary | ICD-10-CM | POA: Diagnosis present

## 2015-08-12 DIAGNOSIS — N400 Enlarged prostate without lower urinary tract symptoms: Secondary | ICD-10-CM | POA: Diagnosis present

## 2015-08-12 HISTORY — PX: REVISION TOTAL KNEE ARTHROPLASTY: SUR1280

## 2015-08-12 HISTORY — PX: TOTAL KNEE REVISION: SHX996

## 2015-08-12 SURGERY — TOTAL KNEE REVISION
Anesthesia: General | Site: Knee | Laterality: Left

## 2015-08-12 MED ORDER — ONDANSETRON HCL 4 MG/2ML IJ SOLN
4.0000 mg | Freq: Four times a day (QID) | INTRAMUSCULAR | Status: DC | PRN
  Administered 2015-08-12 – 2015-08-14 (×3): 4 mg via INTRAVENOUS
  Filled 2015-08-12 (×3): qty 2

## 2015-08-12 MED ORDER — LACTATED RINGERS IV SOLN
INTRAVENOUS | Status: DC
  Administered 2015-08-12 (×3): via INTRAVENOUS

## 2015-08-12 MED ORDER — BUPIVACAINE LIPOSOME 1.3 % IJ SUSP
20.0000 mL | INTRAMUSCULAR | Status: DC
  Filled 2015-08-12: qty 20

## 2015-08-12 MED ORDER — ONDANSETRON HCL 4 MG PO TABS: 4.0000 mg | ORAL_TABLET | Freq: Three times a day (TID) | ORAL | Status: DC | PRN

## 2015-08-12 MED ORDER — ONDANSETRON HCL 4 MG PO TABS
4.0000 mg | ORAL_TABLET | Freq: Four times a day (QID) | ORAL | Status: DC | PRN
  Filled 2015-08-12: qty 1

## 2015-08-12 MED ORDER — METOCLOPRAMIDE HCL 5 MG PO TABS
5.0000 mg | ORAL_TABLET | Freq: Three times a day (TID) | ORAL | Status: DC | PRN
  Administered 2015-08-13: 10 mg via ORAL
  Filled 2015-08-12: qty 2

## 2015-08-12 MED ORDER — HYDROMORPHONE HCL 1 MG/ML IJ SOLN: 0.5000 mg | INTRAMUSCULAR | Status: DC | PRN

## 2015-08-12 MED ORDER — OXYCODONE-ACETAMINOPHEN 5-325 MG PO TABS: 1.0000 | ORAL_TABLET | ORAL | Status: DC | PRN

## 2015-08-12 MED ORDER — ONDANSETRON HCL 4 MG/2ML IJ SOLN
INTRAMUSCULAR | Status: DC | PRN
  Administered 2015-08-12: 4 mg via INTRAVENOUS

## 2015-08-12 MED ORDER — BUPIVACAINE LIPOSOME 1.3 % IJ SUSP
INTRAMUSCULAR | Status: DC | PRN
  Administered 2015-08-12: 20 mL

## 2015-08-12 MED ORDER — OXYCODONE HCL 5 MG PO TABS
5.0000 mg | ORAL_TABLET | ORAL | Status: DC | PRN
  Administered 2015-08-12: 10 mg via ORAL
  Administered 2015-08-12: 5 mg via ORAL
  Administered 2015-08-13 (×5): 10 mg via ORAL
  Administered 2015-08-14: 5 mg via ORAL
  Administered 2015-08-14: 10 mg via ORAL
  Filled 2015-08-12 (×7): qty 2
  Filled 2015-08-12: qty 1
  Filled 2015-08-12: qty 2

## 2015-08-12 MED ORDER — METHOCARBAMOL 1000 MG/10ML IJ SOLN
500.0000 mg | Freq: Four times a day (QID) | INTRAVENOUS | Status: DC | PRN
  Administered 2015-08-12: 500 mg via INTRAVENOUS
  Filled 2015-08-12 (×2): qty 5

## 2015-08-12 MED ORDER — TADALAFIL 5 MG PO TABS: 2.5000 mg | ORAL_TABLET | Freq: Every day | ORAL | Status: DC

## 2015-08-12 MED ORDER — BUPIVACAINE HCL (PF) 0.5 % IJ SOLN
INTRAMUSCULAR | Status: DC | PRN
  Administered 2015-08-12: 10 mL

## 2015-08-12 MED ORDER — PROPOFOL 10 MG/ML IV BOLUS
INTRAVENOUS | Status: DC | PRN
  Administered 2015-08-12: 150 mg via INTRAVENOUS

## 2015-08-12 MED ORDER — MENTHOL 3 MG MT LOZG: 1.0000 | LOZENGE | OROMUCOSAL | Status: DC | PRN

## 2015-08-12 MED ORDER — ACETAMINOPHEN 325 MG PO TABS: 650.0000 mg | ORAL_TABLET | Freq: Four times a day (QID) | ORAL | Status: DC | PRN

## 2015-08-12 MED ORDER — GLYCOPYRROLATE 0.2 MG/ML IJ SOLN
INTRAMUSCULAR | Status: DC | PRN
  Administered 2015-08-12: 0.2 mg via INTRAVENOUS
  Administered 2015-08-12: 0.6 mg via INTRAVENOUS

## 2015-08-12 MED ORDER — POTASSIUM CHLORIDE IN NACL 20-0.9 MEQ/L-% IV SOLN
INTRAVENOUS | Status: DC
Start: 2015-08-12 — End: 2015-08-13
  Administered 2015-08-12: 15:00:00 via INTRAVENOUS
  Administered 2015-08-13: 100 mL/h via INTRAVENOUS
  Filled 2015-08-12 (×2): qty 1000

## 2015-08-12 MED ORDER — CALCIUM CARBONATE ANTACID 500 MG PO CHEW: 1.0000 | CHEWABLE_TABLET | Freq: Every day | ORAL | Status: DC | PRN

## 2015-08-12 MED ORDER — TRANEXAMIC ACID 1000 MG/10ML IV SOLN
2000.0000 mg | Freq: Once | INTRAVENOUS | Status: DC
  Filled 2015-08-12: qty 20

## 2015-08-12 MED ORDER — CEPHALEXIN 500 MG PO CAPS: 500.0000 mg | ORAL_CAPSULE | Freq: Two times a day (BID) | ORAL | Status: DC

## 2015-08-12 MED ORDER — HYDROCHLOROTHIAZIDE 12.5 MG PO CAPS
12.5000 mg | ORAL_CAPSULE | Freq: Every day | ORAL | Status: DC
  Administered 2015-08-13 – 2015-08-14 (×2): 12.5 mg via ORAL
  Filled 2015-08-12 (×2): qty 1

## 2015-08-12 MED ORDER — CEFAZOLIN SODIUM-DEXTROSE 2-3 GM-% IV SOLR
2.0000 g | Freq: Three times a day (TID) | INTRAVENOUS | Status: DC
  Administered 2015-08-12 – 2015-08-14 (×7): 2 g via INTRAVENOUS
  Filled 2015-08-12 (×9): qty 50

## 2015-08-12 MED ORDER — NEOSTIGMINE METHYLSULFATE 10 MG/10ML IV SOLN
INTRAVENOUS | Status: DC | PRN
  Administered 2015-08-12: 5 mg via INTRAVENOUS

## 2015-08-12 MED ORDER — TRANEXAMIC ACID 1000 MG/10ML IV SOLN
2000.0000 mg | INTRAVENOUS | Status: DC | PRN
  Administered 2015-08-12: 2000 mg via INTRAVENOUS

## 2015-08-12 MED ORDER — METOPROLOL TARTRATE 25 MG PO TABS
25.0000 mg | ORAL_TABLET | Freq: Two times a day (BID) | ORAL | Status: DC
  Administered 2015-08-12 – 2015-08-14 (×4): 25 mg via ORAL
  Filled 2015-08-12 (×4): qty 1

## 2015-08-12 MED ORDER — METHOCARBAMOL 500 MG PO TABS
500.0000 mg | ORAL_TABLET | Freq: Four times a day (QID) | ORAL | Status: DC | PRN
Start: 2015-08-12 — End: 2015-08-14
  Administered 2015-08-12 – 2015-08-13 (×2): 500 mg via ORAL
  Filled 2015-08-12 (×3): qty 1

## 2015-08-12 MED ORDER — PROMETHAZINE HCL 25 MG/ML IJ SOLN: 6.2500 mg | INTRAMUSCULAR | Status: DC | PRN

## 2015-08-12 MED ORDER — VANCOMYCIN HCL IN DEXTROSE 1-5 GM/200ML-% IV SOLN: 1000.0000 mg | Freq: Once | INTRAVENOUS | Status: DC

## 2015-08-12 MED ORDER — VANCOMYCIN HCL IN DEXTROSE 1-5 GM/200ML-% IV SOLN
INTRAVENOUS | Status: AC
  Administered 2015-08-12: 1000 mg via INTRAVENOUS
  Filled 2015-08-12: qty 200

## 2015-08-12 MED ORDER — LISINOPRIL-HYDROCHLOROTHIAZIDE 20-12.5 MG PO TABS: 1.0000 | ORAL_TABLET | Freq: Every day | ORAL | Status: DC

## 2015-08-12 MED ORDER — LIDOCAINE HCL (CARDIAC) 20 MG/ML IV SOLN
INTRAVENOUS | Status: DC | PRN
  Administered 2015-08-12: 80 mg via INTRAVENOUS

## 2015-08-12 MED ORDER — PROPOFOL 10 MG/ML IV BOLUS
INTRAVENOUS | Status: AC
  Filled 2015-08-12: qty 20

## 2015-08-12 MED ORDER — SODIUM CHLORIDE 0.9 % IR SOLN
Status: DC | PRN
  Administered 2015-08-12: 9000 mL

## 2015-08-12 MED ORDER — HYDROMORPHONE HCL 1 MG/ML IJ SOLN
INTRAMUSCULAR | Status: AC
  Filled 2015-08-12: qty 1

## 2015-08-12 MED ORDER — BISACODYL 5 MG PO TBEC: 5.0000 mg | DELAYED_RELEASE_TABLET | Freq: Every day | ORAL | Status: AC | PRN

## 2015-08-12 MED ORDER — HYDROMORPHONE HCL 1 MG/ML IJ SOLN
0.2500 mg | INTRAMUSCULAR | Status: DC | PRN
  Administered 2015-08-12 (×4): 0.5 mg via INTRAVENOUS

## 2015-08-12 MED ORDER — APIXABAN 2.5 MG PO TABS
2.5000 mg | ORAL_TABLET | Freq: Two times a day (BID) | ORAL | Status: DC
  Administered 2015-08-13 – 2015-08-14 (×3): 2.5 mg via ORAL
  Filled 2015-08-12 (×3): qty 1

## 2015-08-12 MED ORDER — ROCURONIUM BROMIDE 100 MG/10ML IV SOLN
INTRAVENOUS | Status: DC | PRN
  Administered 2015-08-12: 30 mg via INTRAVENOUS
  Administered 2015-08-12: 10 mg via INTRAVENOUS

## 2015-08-12 MED ORDER — LISINOPRIL 20 MG PO TABS
20.0000 mg | ORAL_TABLET | Freq: Every day | ORAL | Status: DC
  Administered 2015-08-13 – 2015-08-14 (×2): 20 mg via ORAL
  Filled 2015-08-12 (×2): qty 1

## 2015-08-12 MED ORDER — METOPROLOL TARTRATE 12.5 MG HALF TABLET
ORAL_TABLET | ORAL | Status: AC
  Filled 2015-08-12: qty 2

## 2015-08-12 MED ORDER — ACETAMINOPHEN 650 MG RE SUPP: 650.0000 mg | Freq: Four times a day (QID) | RECTAL | Status: DC | PRN

## 2015-08-12 MED ORDER — DILTIAZEM HCL ER COATED BEADS 240 MG PO CP24
240.0000 mg | ORAL_CAPSULE | Freq: Every day | ORAL | Status: DC
  Administered 2015-08-13 – 2015-08-14 (×2): 240 mg via ORAL
  Filled 2015-08-12 (×2): qty 1

## 2015-08-12 MED ORDER — DOCUSATE SODIUM 100 MG PO CAPS
100.0000 mg | ORAL_CAPSULE | Freq: Two times a day (BID) | ORAL | Status: DC
  Administered 2015-08-12 – 2015-08-14 (×4): 100 mg via ORAL
  Filled 2015-08-12 (×4): qty 1

## 2015-08-12 MED ORDER — FENTANYL CITRATE (PF) 250 MCG/5ML IJ SOLN
INTRAMUSCULAR | Status: AC
  Filled 2015-08-12: qty 5

## 2015-08-12 MED ORDER — MAGNESIUM CITRATE PO SOLN: 1.0000 | Freq: Once | ORAL | Status: DC | PRN

## 2015-08-12 MED ORDER — OXYCODONE HCL 5 MG PO TABS
ORAL_TABLET | ORAL | Status: AC
  Filled 2015-08-12: qty 1

## 2015-08-12 MED ORDER — FENTANYL CITRATE (PF) 100 MCG/2ML IJ SOLN
INTRAMUSCULAR | Status: DC | PRN
  Administered 2015-08-12: 50 ug via INTRAVENOUS
  Administered 2015-08-12: 100 ug via INTRAVENOUS
  Administered 2015-08-12 (×4): 50 ug via INTRAVENOUS
  Administered 2015-08-12: 100 ug via INTRAVENOUS
  Administered 2015-08-12: 50 ug via INTRAVENOUS

## 2015-08-12 MED ORDER — 0.9 % SODIUM CHLORIDE (POUR BTL) OPTIME
TOPICAL | Status: DC | PRN
  Administered 2015-08-12: 1000 mL

## 2015-08-12 MED ORDER — BISACODYL 5 MG PO TBEC: 5.0000 mg | DELAYED_RELEASE_TABLET | Freq: Every day | ORAL | Status: DC | PRN

## 2015-08-12 MED ORDER — CELECOXIB 200 MG PO CAPS
200.0000 mg | ORAL_CAPSULE | Freq: Two times a day (BID) | ORAL | Status: DC
Start: 2015-08-12 — End: 2015-08-14
  Administered 2015-08-12 – 2015-08-14 (×4): 200 mg via ORAL
  Filled 2015-08-12 (×4): qty 1

## 2015-08-12 MED ORDER — OXYCODONE HCL 5 MG/5ML PO SOLN: 5.0000 mg | Freq: Once | ORAL | Status: AC | PRN

## 2015-08-12 MED ORDER — EPHEDRINE SULFATE 50 MG/ML IJ SOLN
INTRAMUSCULAR | Status: DC | PRN
  Administered 2015-08-12: 10 mg via INTRAVENOUS

## 2015-08-12 MED ORDER — MIDAZOLAM HCL 2 MG/2ML IJ SOLN
INTRAMUSCULAR | Status: AC
  Filled 2015-08-12: qty 4

## 2015-08-12 MED ORDER — OXYCODONE HCL 5 MG PO TABS
5.0000 mg | ORAL_TABLET | Freq: Once | ORAL | Status: AC | PRN
  Administered 2015-08-12: 5 mg via ORAL

## 2015-08-12 MED ORDER — BUPIVACAINE HCL (PF) 0.5 % IJ SOLN
INTRAMUSCULAR | Status: AC
  Filled 2015-08-12: qty 30

## 2015-08-12 MED ORDER — DIPHENHYDRAMINE HCL 12.5 MG/5ML PO ELIX: 12.5000 mg | ORAL_SOLUTION | ORAL | Status: DC | PRN

## 2015-08-12 MED ORDER — PHENOL 1.4 % MT LIQD: 1.0000 | OROMUCOSAL | Status: DC | PRN

## 2015-08-12 MED ORDER — FAMOTIDINE-CA CARB-MAG HYDROX 10-800-165 MG PO CHEW: 1.0000 | CHEWABLE_TABLET | Freq: Every day | ORAL | Status: DC | PRN

## 2015-08-12 MED ORDER — METOPROLOL TARTRATE 25 MG PO TABS
25.0000 mg | ORAL_TABLET | Freq: Once | ORAL | Status: AC
  Administered 2015-08-12: 25 mg via ORAL

## 2015-08-12 MED ORDER — APIXABAN 2.5 MG PO TABS: ORAL_TABLET | ORAL | Status: DC

## 2015-08-12 MED ORDER — VANCOMYCIN HCL IN DEXTROSE 1-5 GM/200ML-% IV SOLN
1000.0000 mg | Freq: Two times a day (BID) | INTRAVENOUS | Status: DC
  Filled 2015-08-12: qty 200

## 2015-08-12 MED ORDER — LISINOPRIL 20 MG PO TABS: 20.0000 mg | ORAL_TABLET | ORAL | Status: DC

## 2015-08-12 MED ORDER — DEXAMETHASONE SODIUM PHOSPHATE 4 MG/ML IJ SOLN
INTRAMUSCULAR | Status: DC | PRN
  Administered 2015-08-12: 8 mg via INTRAVENOUS

## 2015-08-12 MED ORDER — ZOLPIDEM TARTRATE 5 MG PO TABS: 2.5000 mg | ORAL_TABLET | Freq: Every evening | ORAL | Status: DC | PRN

## 2015-08-12 MED ORDER — FAMOTIDINE 20 MG PO TABS
10.0000 mg | ORAL_TABLET | Freq: Every day | ORAL | Status: DC | PRN
  Administered 2015-08-12: 10 mg via ORAL
  Filled 2015-08-12: qty 1

## 2015-08-12 MED ORDER — POLYETHYLENE GLYCOL 3350 17 G PO PACK: 17.0000 g | PACK | Freq: Every day | ORAL | Status: DC | PRN

## 2015-08-12 MED ORDER — TAMSULOSIN HCL 0.4 MG PO CAPS
0.4000 mg | ORAL_CAPSULE | Freq: Every day | ORAL | Status: DC
  Administered 2015-08-13 – 2015-08-14 (×2): 0.4 mg via ORAL
  Filled 2015-08-12 (×2): qty 1

## 2015-08-12 MED ORDER — SALINE FLUSH 0.9 % IV SOLN
INTRAVENOUS | Status: DC | PRN
  Administered 2015-08-12: 40 mL

## 2015-08-12 MED ORDER — SUCCINYLCHOLINE 20MG/ML (10ML) SYRINGE FOR MEDFUSION PUMP - OPTIME
INTRAMUSCULAR | Status: DC | PRN
  Administered 2015-08-12: 100 mg via INTRAVENOUS

## 2015-08-12 MED ORDER — FERROUS SULFATE 325 (65 FE) MG PO TABS
325.0000 mg | ORAL_TABLET | Freq: Every day | ORAL | Status: DC
  Administered 2015-08-13 – 2015-08-14 (×2): 325 mg via ORAL
  Filled 2015-08-12 (×2): qty 1

## 2015-08-12 MED ORDER — METOCLOPRAMIDE HCL 5 MG/ML IJ SOLN: 5.0000 mg | Freq: Three times a day (TID) | INTRAMUSCULAR | Status: DC | PRN

## 2015-08-12 MED ORDER — METHOCARBAMOL 500 MG PO TABS: 500.0000 mg | ORAL_TABLET | Freq: Four times a day (QID) | ORAL | Status: DC

## 2015-08-12 MED FILL — Sodium Chloride Flush IV Soln 0.9%: INTRAVENOUS | Qty: 40 | Status: AC

## 2015-08-12 SURGICAL SUPPLY — 77 items
BANDAGE ELASTIC 4 VELCRO ST LF (GAUZE/BANDAGES/DRESSINGS) ×3 IMPLANT
BANDAGE ELASTIC 6 VELCRO ST LF (GAUZE/BANDAGES/DRESSINGS) ×3 IMPLANT
BANDAGE ESMARK 6X9 LF (GAUZE/BANDAGES/DRESSINGS) ×1 IMPLANT
BLADE 10 SAFETY STRL DISP (BLADE) ×10 IMPLANT
BLADE SAG 18X100X1.27 (BLADE) ×3 IMPLANT
BLADE SAW SGTL 13.0X1.19X90.0M (BLADE) ×3 IMPLANT
BLADE SAW SGTL 81X20 HD (BLADE) ×2 IMPLANT
BLADE SAW SGTL NAR THIN XSHT (BLADE) ×2 IMPLANT
BLADE SURG ROTATE 9660 (MISCELLANEOUS) IMPLANT
BNDG CMPR 9X6 STRL LF SNTH (GAUZE/BANDAGES/DRESSINGS) ×1
BNDG ESMARK 6X9 LF (GAUZE/BANDAGES/DRESSINGS) ×3
BNDG GAUZE ELAST 4 BULKY (GAUZE/BANDAGES/DRESSINGS) ×2 IMPLANT
BONE CEMENT SIMPLEX TOBRAMYCIN (Cement) ×3 IMPLANT
BOWL SMART MIX CTS (DISPOSABLE) IMPLANT
CEMENT BONE SIMPLEX SPEEDSET (Cement) ×2 IMPLANT
CEMENT BONE SIMPLEX TOBRAMYCIN (Cement) IMPLANT
COMPONENT TIBIAL LRG (Knees) IMPLANT
COVER SURGICAL LIGHT HANDLE (MISCELLANEOUS) ×3 IMPLANT
CUFF TOURNIQUET SINGLE 34IN LL (TOURNIQUET CUFF) IMPLANT
DRAPE EXTREMITY T 121X128X90 (DRAPE) ×3 IMPLANT
DRAPE IMP U-DRAPE 54X76 (DRAPES) ×3 IMPLANT
DRAPE U-SHAPE 47X51 STRL (DRAPES) ×3 IMPLANT
DRSG PAD ABDOMINAL 8X10 ST (GAUZE/BANDAGES/DRESSINGS) ×3 IMPLANT
DURAPREP 26ML APPLICATOR (WOUND CARE) ×6 IMPLANT
ELECT CAUTERY BLADE 6.4 (BLADE) ×3 IMPLANT
ELECT REM PT RETURN 9FT ADLT (ELECTROSURGICAL) ×3
ELECTRODE REM PT RTRN 9FT ADLT (ELECTROSURGICAL) ×1 IMPLANT
EVACUATOR 1/8 PVC DRAIN (DRAIN) IMPLANT
FACESHIELD WRAPAROUND (MASK) ×3 IMPLANT
FACESHIELD WRAPAROUND OR TEAM (MASK) ×1 IMPLANT
FEMORAL COMP LRG REMEDY (Knees) ×2 IMPLANT
GAUZE SPONGE 4X4 12PLY STRL (GAUZE/BANDAGES/DRESSINGS) ×3 IMPLANT
GAUZE XEROFORM 1X8 LF (GAUZE/BANDAGES/DRESSINGS) ×3 IMPLANT
GLOVE BIOGEL PI IND STRL 7.0 (GLOVE) ×1 IMPLANT
GLOVE BIOGEL PI INDICATOR 7.0 (GLOVE) ×2
GLOVE ECLIPSE 6.5 STRL STRAW (GLOVE) ×4 IMPLANT
GLOVE ECLIPSE 7.0 STRL STRAW (GLOVE) ×3 IMPLANT
GLOVE ORTHO TXT STRL SZ7.5 (GLOVE) ×3 IMPLANT
GOWN STRL REUS W/ TWL LRG LVL3 (GOWN DISPOSABLE) ×3 IMPLANT
GOWN STRL REUS W/ TWL XL LVL3 (GOWN DISPOSABLE) ×1 IMPLANT
GOWN STRL REUS W/TWL LRG LVL3 (GOWN DISPOSABLE) ×9
GOWN STRL REUS W/TWL XL LVL3 (GOWN DISPOSABLE) ×3
HANDPIECE INTERPULSE COAX TIP (DISPOSABLE)
IMMOBILIZER KNEE 22 UNIV (SOFTGOODS) ×3 IMPLANT
KIT BASIN OR (CUSTOM PROCEDURE TRAY) ×3 IMPLANT
KIT ROOM TURNOVER OR (KITS) ×3 IMPLANT
MANIFOLD NEPTUNE II (INSTRUMENTS) ×3 IMPLANT
NDL 18GX1X1/2 (RX/OR ONLY) (NEEDLE) ×1 IMPLANT
NEEDLE 18GX1X1/2 (RX/OR ONLY) (NEEDLE) ×3 IMPLANT
NEEDLE 22X1 1/2 (OR ONLY) (NEEDLE) ×3 IMPLANT
NS IRRIG 1000ML POUR BTL (IV SOLUTION) ×3 IMPLANT
PACK TOTAL JOINT (CUSTOM PROCEDURE TRAY) ×3 IMPLANT
PACK UNIVERSAL I (CUSTOM PROCEDURE TRAY) ×3 IMPLANT
PAD ARMBOARD 7.5X6 YLW CONV (MISCELLANEOUS) ×6 IMPLANT
PAD CAST 4YDX4 CTTN HI CHSV (CAST SUPPLIES) ×1 IMPLANT
PADDING CAST COTTON 4X4 STRL (CAST SUPPLIES) ×3
PADDING CAST COTTON 6X4 STRL (CAST SUPPLIES) ×3 IMPLANT
SET HNDPC FAN SPRY TIP SCT (DISPOSABLE) IMPLANT
SPONGE GAUZE 4X4 12PLY STER LF (GAUZE/BANDAGES/DRESSINGS) ×2 IMPLANT
STAPLER VISISTAT 35W (STAPLE) ×2 IMPLANT
SUCTION FRAZIER TIP 10 FR DISP (SUCTIONS) ×3 IMPLANT
SUT MNCRL AB 4-0 PS2 18 (SUTURE) ×3 IMPLANT
SUT VIC AB 0 CT1 27 (SUTURE) ×6
SUT VIC AB 0 CT1 27XBRD ANBCTR (SUTURE) IMPLANT
SUT VIC AB 1 CTX 36 (SUTURE) ×3
SUT VIC AB 1 CTX36XBRD ANBCTR (SUTURE) ×1 IMPLANT
SUT VIC AB 2-0 CT1 27 (SUTURE) ×9
SUT VIC AB 2-0 CT1 TAPERPNT 27 (SUTURE) ×2 IMPLANT
SUT VIC AB 2-0 CTB1 (SUTURE) ×2 IMPLANT
SYR 50ML LL SCALE MARK (SYRINGE) ×3 IMPLANT
SYR CONTROL 10ML LL (SYRINGE) ×2 IMPLANT
TIBIAL COMPONENT LRG (Knees) ×3 IMPLANT
TOWEL OR 17X24 6PK STRL BLUE (TOWEL DISPOSABLE) ×3 IMPLANT
TOWEL OR 17X26 10 PK STRL BLUE (TOWEL DISPOSABLE) ×3 IMPLANT
TRAY FOLEY CATH 16FRSI W/METER (SET/KITS/TRAYS/PACK) IMPLANT
TUBE ANAEROBIC SPECIMEN COL (MISCELLANEOUS) IMPLANT
WATER STERILE IRR 1000ML POUR (IV SOLUTION) ×3 IMPLANT

## 2015-08-12 NOTE — Transfer of Care (Signed)
Immediate Anesthesia Transfer of Care Note  Patient: Marc Maldonado  Procedure(s) Performed: Procedure(s): LEFT TOTAL KNEE REVISION (Left)  Patient Location: PACU  Anesthesia Type:General  Level of Consciousness: awake, alert , oriented, patient cooperative and responds to stimulation  Airway & Oxygen Therapy: Patient Spontanous Breathing and Patient connected to nasal cannula oxygen  Post-op Assessment: Report given to RN, Post -op Vital signs reviewed and stable, Patient moving all extremities X 4 and Patient able to stick tongue midline  Post vital signs: Reviewed and stable  Last Vitals:  Filed Vitals:   08/12/15 1120  BP: 152/85  Pulse:   Temp: 36.4 C  Resp:     Complications: No apparent anesthesia complications

## 2015-08-12 NOTE — Progress Notes (Signed)
ANTIBIOTIC CONSULT NOTE - INITIAL  Pharmacy Consult for Ancef Indication: osteo  No Known Allergies  Patient Measurements: Height: 5\' 10"  (177.8 cm) Weight: 248 lb 12.8 oz (112.855 kg) IBW/kg (Calculated) : 73  Vital Signs: Temp: 97.5 F (36.4 C) (10/19 1300) Temp Source: Oral (10/19 0652) BP: 154/80 mmHg (10/19 1300) Pulse Rate: 65 (10/19 1300) Intake/Output from previous day:   Intake/Output from this shift: Total I/O In: 2200 [I.V.:2200] Out: 375 [Drains:50; Blood:325]  Labs: No results for input(s): WBC, HGB, PLT, LABCREA, CREATININE in the last 72 hours. Estimated Creatinine Clearance: 69 mL/min (by C-G formula based on Cr of 1.29). No results for input(s): VANCOTROUGH, VANCOPEAK, VANCORANDOM, GENTTROUGH, GENTPEAK, GENTRANDOM, TOBRATROUGH, TOBRAPEAK, TOBRARND, AMIKACINPEAK, AMIKACINTROU, AMIKACIN in the last 72 hours.   Microbiology: Recent Results (from the past 720 hour(s))  Urine culture     Status: None   Collection Time: 07/31/15 10:36 AM  Result Value Ref Range Status   Specimen Description URINE, CLEAN CATCH  Final   Special Requests NONE  Final   Culture   Final    40,000 COLONIES/ml STAPHYLOCOCCUS SPECIES (COAGULASE NEGATIVE)   Report Status 08/02/2015 FINAL  Final   Organism ID, Bacteria STAPHYLOCOCCUS SPECIES (COAGULASE NEGATIVE)  Final      Susceptibility   Staphylococcus species (coagulase negative) - MIC*    CIPROFLOXACIN >=8 RESISTANT Resistant     GENTAMICIN >=16 RESISTANT Resistant     NITROFURANTOIN <=16 SENSITIVE Sensitive     OXACILLIN >=4 RESISTANT Resistant     TETRACYCLINE 2 SENSITIVE Sensitive     VANCOMYCIN 1 SENSITIVE Sensitive     TRIMETH/SULFA >=320 RESISTANT Resistant     CLINDAMYCIN <=0.25 SENSITIVE Sensitive     RIFAMPIN <=0.5 SENSITIVE Sensitive     Inducible Clindamycin NEGATIVE Sensitive     * 40,000 COLONIES/ml STAPHYLOCOCCUS SPECIES (COAGULASE NEGATIVE)  Surgical pcr screen     Status: None   Collection Time: 07/31/15  11:02 AM  Result Value Ref Range Status   MRSA, PCR NEGATIVE NEGATIVE Final   Staphylococcus aureus NEGATIVE NEGATIVE Final    Comment:        The Xpert SA Assay (FDA approved for NASAL specimens in patients over 8 years of age), is one component of a comprehensive surveillance program.  Test performance has been validated by [facility] for patients greater than or equal to 24 year old. It is not intended to diagnose infection nor to guide or monitor treatment.     Medical History: Past Medical History  Diagnosis Date  . BPH (benign prostatic hyperplasia)   . Wears glasses   . Hypertension   . GERD (gastroesophageal reflux disease)     occas. indigestion   . Chronic kidney disease     renal stone-(2) tumors removed fr. bladder  . Cancer ([place])     small section of bowel removed that was malignant - no further treatment   . Arthritis     low back & neck,- DDD  . 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

## 2015-08-12 NOTE — Op Note (Signed)
[name], OLD NO.:  [id number]  MEDICAL RECORD NO.:  [id number]  LOCATION:  5N31C                        FACILITY:  [facility]  PHYSICIAN:  [name], M.D. 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  Probable loosening of components.  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  Probable loosening of components.  Obvious marked adhesions.  Reactive inflammatory tissue and cloudy fluid.  No gross purulence.  The femoral and tibial components were partially loose, but not grossly loose.  PROCEDURE:  Exploration of left knee.  Marked excision of adhesions. Removal of all remaining components of cement including entire femoral component, tibial component, polyethylene, and patella.  This was followed by extensive irrigation and debridement.  Placement of a Stryker large lightly cemented spacer on both the tibia and femur. Placement of a drain.  SURGEON:  [name], M.D.  ASSISTANT:  Elmyra Ricks, P.A., present throughout the entire case and necessary for timely completion of procedure.  ANESTHESIA:  General.  BLOOD LOSS:  Minimal.  SPECIMENS:  None.  CULTURES:  Aerobic and anaerobic cultures were obtained.  COMPLICATION:  None.  DRESSINGS:  Soft compressive.  DRAINS:  Hemovac x1.  TOURNIQUET TIME:  1 hour 15 minutes.  DESCRIPTION OF PROCEDURE:  The patient was brought to operating room and after adequate anesthesia had been obtained, left knee was examined. Motion 0 to 90, stopped by adhesions.  Tourniquet applied.  Prepped and draped in usual sterile fashion.  Exsanguinated with elevation of Esmarch.  Tourniquet inflated to 350 mmHg.  Previous incision  was opened.  Extensive intra and extra-articular adhesions removed.  Medial arthrotomy vastus splitting.  Knee exposed.  There was no gross purulence, but a little bit of cloudy fluid and a lot of hypertrophic inflammatory tissue.  Joint fluid was sent for aerobic and anaerobic cultures.  I then did an extensive debridement of adhesions and synovectomy.  Able to expose all components.  The patella was removed from the back of patella with a saw, remaining cement and pegs were then removed.  Polyethylene attached to the tibia.  Utilizing thin osteotomes and saw, I then freed up both the femoral and tibial components. Femoral components removed and the entire tibial component removed.  All remaining cement removed.  Hypertrophic inflammatory tissue removed. Fortunately, there was not a lot of bone loss during removal of components.  I then did a thorough irrigation with 9 L of saline.  When that was complete, I utilized the large cement antibiotic impregnated spacers that were lightly cemented on the tibia and femur.  Once cement hardened, the knee was irrigated once again.  The cement I used along with the spacer was tobramycin impregnated.  Hemovac was placed. Arthrotomy closed with Vicryl.  Subcutaneous and subcuticular closure utilizing staples on the skin.  Margins were injected with Marcaine. Sterile compressive dressing applied.  Tourniquet deflated and removed. Knee immobilizer applied.  Anesthesia reversed.  Brought to  the recovery room.  Tolerated the surgery well.  No complications.     [name], M.D.     DFM/MEDQ  D:  08/12/2015  T:  08/12/2015  Job:  [id number]

## 2015-08-12 NOTE — Discharge Instructions (Addendum)
INSTRUCTIONS AFTER JOINT REPLACEMENT   o Remove items at home which could result in a fall. This includes throw rugs or furniture in walking pathways o ICE to the affected joint every three hours while awake for 30 minutes at a time, for at least the first 3-5 days, and then as needed for pain and swelling.  Continue to use ice for pain and swelling. You may notice swelling that will progress down to the foot and ankle.  This is normal after surgery.  Elevate your leg when you are not up walking on it.   o Continue to use the breathing machine you got in the hospital (incentive spirometer) which will help keep your temperature down.  It is common for your temperature to cycle up and down following surgery, especially at night when you are not up moving around and exerting yourself.  The breathing machine keeps your lungs expanded and your temperature down.  BLOOD THINNER: TAKE ELIQUIS AS DIRECTED FOR A TOTAL OF 6 WEEKS FOLLOWING SURGERY TO PREVENT BLOOD CLOTS.  ONCE FINISHED WITH THIS, YOU MAY RESUME YOUR REGULAR DOSE OF ASPIRIN DIET:  As you were doing prior to hospitalization, we recommend a well-balanced diet.  DRESSING / WOUND CARE / SHOWERING  You may change your dressing 3-5 days after surgery.  Then change the dressing every day with sterile gauze.  Please use good hand washing techniques before changing the dressing.  Do not use any lotions or creams on the incision until instructed by your surgeon. and You may shower 3 days after surgery, but keep the wounds dry during showering.  You may use an occlusive plastic wrap (Press'n Seal for example), NO SOAKING/SUBMERGING IN THE BATHTUB.  If the bandage gets wet, change with a clean dry gauze.  If the incision gets wet, pat the wound dry with a clean towel.     WEIGHT BEARING   Weight bearing as tolerated with assist device (walker, cane, etc) as directed, use it as long as suggested by your surgeon or therapist, typically at least 4-6  weeks.   CONSTIPATION  Constipation is defined medically as fewer than three stools per week and severe constipation as less than one stool per week.  Even if you have a regular bowel pattern at home, your normal regimen is likely to be disrupted due to multiple reasons following surgery.  Combination of anesthesia, postoperative narcotics, change in appetite and fluid intake all can affect your bowels.   YOU MUST use at least one of the following options; they are listed in order of increasing strength to get the job done.  They are all available over the counter, and you may need to use some, POSSIBLY even all of these options:    Drink plenty of fluids (prune juice may be helpful) and high fiber foods Colace 100 mg by mouth twice a day  Senokot for constipation as directed and as needed Dulcolax (bisacodyl), take with full glass of water  Miralax (polyethylene glycol) once or twice a day as needed.  If you have tried all these things and are unable to have a bowel movement in the first 3-4 days after surgery call either your surgeon or your primary doctor.    If you experience loose stools or diarrhea, hold the medications until you stool forms back up.  If your symptoms do not get better within 1 week or if they get worse, check with your doctor.  If you experience "the worst abdominal pain ever" or  develop nausea or vomiting, please contact the office immediately for further recommendations for treatment.   ITCHING:  If you experience itching with your medications, try taking only a single pain pill, or even half a pain pill at a time.  You can also use Benadryl over the counter for itching or also to help with sleep.   TED HOSE STOCKINGS:  Use stockings on both legs until for at least 2 weeks or as directed by physician office. They may be removed at night for sleeping.  MEDICATIONS:  See your medication summary on the After Visit Summary that nursing will review with you.  You may  have some home medications which will be placed on hold until you complete the course of blood thinner medication.  It is important for you to complete the blood thinner medication as prescribed.  PRECAUTIONS:  If you experience chest pain or shortness of breath - call 911 immediately for transfer to the hospital emergency department.   If you develop a fever greater that 101 F, purulent drainage from wound, increased redness or drainage from wound, foul odor from the wound/dressing, or calf pain - CONTACT YOUR SURGEON.                                                   FOLLOW-UP APPOINTMENTS:  If you do not already have a post-op appointment, please call the office for an appointment to be seen by your surgeon.  Guidelines for how soon to be seen are listed in your After Visit Summary, but are typically between 1-4 weeks after surgery.  OTHER INSTRUCTIONS:   Knee Replacement:  Do not place pillow under knee, focus on keeping the knee straight while resting. CPM instructions: 0-90 degrees, 2 hours in the morning, 2 hours in the afternoon, and 2 hours in the evening. Place foam block, curve side up under heel at all times except when in CPM or when walking.  DO NOT modify, tear, cut, or change the foam block in any way.  MAKE SURE YOU:   Understand these instructions.   Get help right away if you are not doing well or get worse.    Thank you for letting us be a part of your medical care team.  It is a privilege we respect greatly.  We hope these instructions will help you stay on track for a fast and full recovery!    Information on my medicine - ELIQUIS (apixaban)  This medication education was reviewed with me or my healthcare representative as part of my discharge preparation.  The pharmacist that spoke with me during my hospital stay was:  Reginia Naas, [facility]  Why was Eliquis prescribed for you? Eliquis was prescribed for you to reduce the risk of blood clots forming after  orthopedic surgery.    What do You need to know about Eliquis? Take your Eliquis TWICE DAILY - one tablet in the morning and one tablet in the evening with or without food.  It would be best to take the dose about the same time each day.  If you have difficulty swallowing the tablet whole please discuss with your pharmacist how to take the medication safely.  Take Eliquis exactly as prescribed by your doctor and DO NOT stop taking Eliquis without talking to the doctor who prescribed the medication.  Stopping without other  medication to take the place of Eliquis may increase your risk of developing a clot.  After discharge, you should have regular check-up appointments with your healthcare provider that is prescribing your Eliquis.  What do you do if you miss a dose? If a dose of ELIQUIS is not taken at the scheduled time, take it as soon as possible on the same day and twice-daily administration should be resumed.  The dose should not be doubled to make up for a missed dose.  Do not take more than one tablet of ELIQUIS at the same time.  Important Safety Information A possible side effect of Eliquis is bleeding. You should call your healthcare provider right away if you experience any of the following: ? Bleeding from an injury or your nose that does not stop. ? Unusual colored urine (red or dark brown) or unusual colored stools (red or black). ? Unusual bruising for unknown reasons. ? A serious fall or if you hit your head (even if there is no bleeding).  Some medicines may interact with Eliquis and might increase your risk of bleeding or clotting while on Eliquis. To help avoid this, consult your healthcare provider or pharmacist prior to using any new prescription or non-prescription medications, including herbals, vitamins, non-steroidal anti-inflammatory drugs (NSAIDs) and supplements.  This website has more information on Eliquis (apixaban):  http://www.eliquis.com/eliquis/home

## 2015-08-12 NOTE — Consult Note (Signed)
[facility]   Date of Admission:  08/12/2015  Date of Consult:  08/12/2015  Reason for Consult: History of staph aureus infection of L TKA Referring Physician: Eulah Pont MD (orthopedics)   HPI:  [name] is a 69 year old man with history of degenerative joint disease s/p L TKA in 2003, had aseptic loosening of tibial component with significant polyethylene wear and underwent revision 10/02/2013 complicated by infection with subsequent I&D and polyethylene exchange 10/18/2013, now s/p left total knee arthroplasty revision.  He was hospitalized 10/17/2013 for increased left knee pain and drainage. He proceeded to the OR the following day for I&D and polyethylene exchange. Blood culture from OSH grew staph aureus. Wound culture was found to have staph aureus. He was started on cefazolin. ID was consulted 10/18/2013. Vancomycin and oral rifampin were added. Repeat blood culture on 12/27 was negative. TTE with no signs of endocarditis. 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 He was discharged on cefazolin. Rifampin was readded at follow up on 1/13. Antibiotics were completed on 11/28/2013. At that time, he was changed to Keflex 500mg  BID and continued on rifampin 300mg  daily. Rifampin was stopped 02/26/2014 due to coumadin interaction (placed on anticoagulation after VTE provoked by right PICC). He continued Keflex to finish 6 month course.   He had synovitis and underwent arthroscopy and synovectomy 11/2014. Following a dental procedure 01/2015, he developed redness and swelling of the knee but culture was negative. He was placed on suppressive Bactrim by his orthopedic surgeon. Bactrim discontinued due concern for potential renal insufficiency.   He was seen in clinic 07/21/2015 and was placed on suppressive doses of Keflex at 500mg  BID.   Of note, he is followed by Dr. 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  Past Medical History  Diagnosis Date  . BPH (benign prostatic hyperplasia)   . Wears glasses   . Hypertension   . GERD (gastroesophageal reflux disease)     occas. indigestion   . Chronic kidney disease     renal stone-(2) tumors removed fr. bladder  . Cancer (HCC)     small section of bowel removed that was malignant - no further treatment   . Arthritis     low back & neck,- DDD  . DJD (degenerative joint disease)     Past Surgical History  Procedure Laterality Date  . Total knee arthroplasty  2008    rt  . Total knee arthroplasty  2003    left  . Tonsillectomy    . Carpal tunnel release  2012    rt and lt  . Hernia repair  2007    umb hr  . Cystoscopy with biopsy      bladder tumor  . Colonoscopy    . Shoulder arthroscopy w/ rotator cuff repair  2010 & 2013    bilateral   . Total knee revision Right 07/24/2013    Procedure: TOTAL KNEE REVISION;  Surgeon: 2011, MD;  Location: [facility] OR;  Service: Orthopedics;  Laterality: Right;  . Total knee revision Left 10/02/2013    Procedure: TOTAL KNEE REVISION, tibial component;  Surgeon: Loreta Ave, MD;  Location: [facility] OR;  Service: Orthopedics;  Laterality: Left;  . Total knee revision Left 10/18/13  . I&d knee with poly exchange Left 10/18/2013    Procedure: IRRIGATION AND DEBRIDEMENT  KNEE WITH POLY EXCHANGE;  Surgeon: Renette Butters, MD;  Location: [place];  Service: Orthopedics;  Laterality: Left;    Social History:  reports that he has never smoked. He has never used smokeless tobacco. He reports that he does not drink alcohol or use illicit drugs.   History reviewed. No pertinent family history.  No Known Allergies   Medications: I have reviewed patients current medications as documented in Epic Anti-infectives    Start     Dose/Rate Route Frequency Ordered Stop   08/12/15 2100  vancomycin (VANCOCIN) IVPB 1000 mg/200 mL premix      1,000 mg 200 mL/hr over 60 Minutes Intravenous Every 12 hours 08/12/15 1314 08/13/15 0859   08/12/15 1315  cephALEXin (KEFLEX) capsule 500 mg  Status:  Discontinued     500 mg Oral 2 times daily 08/12/15 1314 08/12/15 1356   08/12/15 0647  vancomycin (VANCOCIN) 1 GM/200ML IVPB    Comments:  Forte, Lindsi   : cabinet override      08/12/15 0647 08/12/15 1008   08/12/15 0645  vancomycin (VANCOCIN) IVPB 1000 mg/200 mL premix  Status:  Discontinued     1,000 mg 200 mL/hr over 60 Minutes Intravenous  Once 08/12/15 0643 08/12/15 1314      ROS:  Constitutional: no fevers/chills Eyes: no vision changes Ears, nose, mouth, throat, and face: no cough Respiratory: no shortness of breath Cardiovascular: no chest pain Gastrointestinal: no nausea/vomiting, no abdominal pain, no constipation, no diarrhea Genitourinary: no dysuria, no hematuria Integument: no rash Hematologic/lymphatic: no bleeding/bruising, no edema Musculoskeletal: +left knee pain, no myalgias Neurological: no paresthesias, no weakness   Blood pressure 154/80, pulse 65, temperature 97.5 F (36.4 C), temperature source Oral, resp. rate 16, height $RemoveBe'5\' 10"'HkbLQkVEC$  (1.778 m), weight 248 lb 12.8 oz (112.855 kg), SpO2 97 %. General: Alert and awake, oriented x3, not in any acute distress. HEENT: anicteric sclera,  EOMI, oropharynx clear and without exudate Cardiovascular: regular rate, normal r,  no murmur rubs or gallops Pulmonary: clear to auscultation bilaterally, no wheezing, rales or rhonchi Gastrointestinal: soft nontender, nondistended, normal bowel sounds, Musculoskeletal: no  clubbing or edema noted bilaterally. L knee in surgical dressing, hemovac in place. Skin, soft tissue: no rashes Neuro: nonfocal, strength and sensation intact   No results found for this or any previous visit (from the past 48 hour(s)).  Recent Results (from the past 720 hour(s))  Urine culture     Status: None   Collection Time: 07/31/15 10:36 AM    Result Value Ref Range Status   Specimen Description URINE, CLEAN CATCH  Final   Special Requests NONE  Final   Culture   Final    40,000 COLONIES/ml STAPHYLOCOCCUS SPECIES (COAGULASE NEGATIVE)   Report Status 08/02/2015 FINAL  Final   Organism ID, Bacteria STAPHYLOCOCCUS SPECIES (COAGULASE NEGATIVE)  Final      Susceptibility   Staphylococcus species (coagulase negative) - MIC*    CIPROFLOXACIN >=8 RESISTANT Resistant     GENTAMICIN >=16 RESISTANT Resistant     NITROFURANTOIN <=16 SENSITIVE Sensitive     OXACILLIN >=4 RESISTANT Resistant     TETRACYCLINE 2 SENSITIVE Sensitive     VANCOMYCIN 1 SENSITIVE Sensitive     TRIMETH/SULFA >=320 RESISTANT Resistant     CLINDAMYCIN <=0.25 SENSITIVE Sensitive     RIFAMPIN <=0.5 SENSITIVE Sensitive     Inducible Clindamycin NEGATIVE Sensitive     * 40,000 COLONIES/ml STAPHYLOCOCCUS SPECIES (COAGULASE NEGATIVE)  Surgical pcr screen     Status: None  Collection Time: 07/31/15 11:02 AM  Result Value Ref Range Status   MRSA, PCR NEGATIVE NEGATIVE Final   Staphylococcus aureus NEGATIVE NEGATIVE Final    Comment:        The Xpert SA Assay (FDA approved for NASAL specimens in patients over 45 years of age), is one component of a comprehensive surveillance program.  Test performance has been validated by [facility] for patients greater than or equal to 39 year old. It is not intended to diagnose infection nor to guide or monitor treatment.      Impression/Recommendation:  Active Problems:   DJD (degenerative joint disease) of knee  69 year old man with history of degenerative joint disease s/p L TKA in 2003, had aseptic loosening of tibial component with significant polyethylene wear and underwent revision 30/74/6002 complicated by infection with subsequent I&D and polyethylene exchange 10/18/2013, now s/p left total knee arthroplasty revision.  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 Remained with persistent pain. Now s/p first of two stage resection arthroplasty with reimplantation.  -Discontinued vancomycin. Started on IV cefazolin 2g q8hr. Will continue this for a 6 week course ending November 30. -Check ESR, CRP, HIV screen -Towards end of 6 week course, will recheck ESR, CRP -May consider 2 weeks of antibiotic holiday following the 6 weeks of cefazolin, then joint aspiration with cell count and culture.  [place] [facility] [place] [phone] (pager) [phone] (office)  Jacques Earthly, MD  Internal Medicine PGY-2 08/12/2015, 2:19 PM

## 2015-08-12 NOTE — Anesthesia Procedure Notes (Signed)
Procedure Name: Intubation Date/Time: 08/12/2015 8:41 AM Performed by: Vennie Homans Pre-anesthesia Checklist: Patient identified, Timeout performed, Emergency Drugs available, Suction available and Patient being monitored Patient Re-evaluated:Patient Re-evaluated prior to inductionOxygen Delivery Method: Circle system utilized Preoxygenation: Pre-oxygenation with 100% oxygen Intubation Type: IV induction Ventilation: Mask ventilation without difficulty Laryngoscope Size: Mac and 3 Grade View: Grade I Tube type: Oral Tube size: 7.5 mm Number of attempts: 1 Airway Equipment and Method: Stylet Secured at: 21 cm Tube secured with: Tape Dental Injury: Teeth and Oropharynx as per pre-operative assessment

## 2015-08-12 NOTE — Transfer of Care (Signed)
Immediate Anesthesia Transfer of Care Note  Patient: Marc Maldonado  Procedure(s) Performed: Procedure(s): LEFT TOTAL KNEE REVISION (Left)  Patient Location: PACU  Anesthesia Type:General  Level of Consciousness: awake, alert , oriented, patient cooperative and responds to stimulation  Airway & Oxygen Therapy: Patient Spontanous Breathing and Patient connected to nasal cannula oxygen  Post-op Assessment: Report given to RN, Post -op Vital signs reviewed and stable, Patient moving all extremities X 4 and Patient able to stick tongue midline  Post vital signs: Reviewed and stable  Last Vitals:  Filed Vitals:   08/12/15 1120  BP: 152/85  Pulse: 74  Temp: 36.4 C  Resp: 9    Complications: No apparent anesthesia complications

## 2015-08-12 NOTE — Discharge Summary (Addendum)
Patient ID: Marc Maldonado MRN: [id number] DOB/AGE: Aug 12, 1946 69 y.o.  Admit date: 08/12/2015 Discharge date: 08/14/2015  Admission Diagnoses:  Active Problems:   DJD (degenerative joint disease) of knee   S/P revision of total knee   Prosthetic joint infection (HCC)   MSSA (methicillin susceptible Staphylococcus aureus) infection   Discharge Diagnoses:  Same  Past Medical History  Diagnosis Date  . BPH (benign prostatic hyperplasia)   . Wears glasses   . Hypertension   . GERD (gastroesophageal reflux disease)     occas. indigestion   . Chronic kidney disease     renal stone-(2) tumors removed fr. bladder  . Cancer ([place])     small section of bowel removed that was malignant - no further treatment   . Arthritis     low back & neck,- DDD  . DJD (degenerative joint disease)     Surgeries: Procedure(s): LEFT TOTAL KNEE REVISION on 08/12/2015   Consultants:  ID  Discharged Condition: Improved  Hospital Course: Marc Maldonado is an 69 y.o. male who was admitted 08/12/2015 for operative treatment of left prosthetic knee infection. Patient has severe unremitting pain that affects sleep, daily activities, and work/hobbies. After pre-op clearance the patient was taken to the operating room on 08/12/2015 and underwent  Procedure(s): LEFT TOTAL KNEE REVISION.  Patient with a pre-op Hb of 11.8 developed ABLA on pod#1 with a  Hb of 9.9.  Patient is currently asymptomatic but we will continue to follow.  Patient was given perioperative antibiotics:      Anti-infectives    Start     Dose/Rate Route Frequency Ordered Stop   08/12/15 2100  vancomycin (VANCOCIN) IVPB 1000 mg/200 mL premix  Status:  Discontinued     1,000 mg 200 mL/hr over 60 Minutes Intravenous Every 12 hours 08/12/15 1314 08/12/15 1538   08/12/15 1545  ceFAZolin (ANCEF) IVPB 2 g/50 mL premix     2 g 100 mL/hr over 30 Minutes Intravenous 3 times per day 08/12/15 1540     08/12/15 1315  cephALEXin (KEFLEX) capsule  500 mg  Status:  Discontinued     500 mg Oral 2 times daily 08/12/15 1314 08/12/15 1356   08/12/15 0647  vancomycin (VANCOCIN) 1 GM/200ML IVPB    Comments:  Forte, Lindsi   : cabinet override      08/12/15 0647 08/12/15 1008   08/12/15 0645  vancomycin (VANCOCIN) IVPB 1000 mg/200 mL premix  Status:  Discontinued     1,000 mg 200 mL/hr over 60 Minutes Intravenous  Once 08/12/15 0643 08/12/15 1314       Patient was given sequential compression devices, early ambulation, and chemoprophylaxis to prevent DVT.  Patient benefited maximally from hospital stay and there were no complications.    Recent vital signs:  Patient Vitals for the past 24 hrs:  BP Temp Pulse Resp SpO2  08/14/15 0600 (!) 156/75 mmHg 98.2 F (36.8 C) (!) 57 18 97 %  08/13/15 2034 125/74 mmHg 97.8 F (36.6 C) 60 18 96 %  08/13/15 1300 (!) 158/79 mmHg 97.8 F (36.6 C) (!) 51 20 99 %  08/13/15 0949 (!) 153/82 mmHg - (!) 59 - -  08/13/15 0947 (!) 153/82 mmHg - - - -     Recent laboratory studies:   Recent Labs  08/13/15 0255 08/14/15 0515  WBC 8.5 6.1  HGB 11.2* 9.9*  HCT 33.7* 30.3*  PLT 190 147*  NA 138 140  K 3.9 3.9  CL 103 103  CO2 28 30  BUN 12 12  CREATININE 1.15 1.23  GLUCOSE 135* 94  CALCIUM 8.6* 8.7*     Discharge Medications:     Medication List    STOP taking these medications        aspirin 81 MG tablet     diphenhydramine-acetaminophen 25-500 MG Tabs tablet  Commonly known as:  TYLENOL PM      TAKE these medications        apixaban 2.5 MG Tabs tablet  Commonly known as:  ELIQUIS  Take 1 tab po Q12 hours x 6 weeks following surgery to prevent blood clots     bisacodyl 5 MG EC tablet  Commonly known as:  DULCOLAX  Take 1 tablet (5 mg total) by mouth daily as needed for moderate constipation.     CARTIA XT 240 MG 24 hr capsule  Generic drug:  diltiazem  Take 1 capsule by mouth daily.     cephALEXin 500 MG capsule  Commonly known as:  KEFLEX  Take 1 capsule (500 mg  total) by mouth 2 (two) times daily.     cholecalciferol 1000 UNITS tablet  Commonly known as:  VITAMIN D  Take 1,000 Units by mouth daily.     docusate sodium 100 MG capsule  Commonly known as:  COLACE  Take 100 mg by mouth daily.     famotidine-calcium carbonate-magnesium hydroxide 10-800-165 MG Chew chewable tablet  Commonly known as:  PEPCID COMPLETE  Chew 1 tablet by mouth daily as needed (heartburn).     ferrous sulfate 325 (65 FE) MG tablet  Take 325 mg by mouth daily with breakfast.     lisinopril 20 MG tablet  Commonly known as:  PRINIVIL,ZESTRIL  Take 20 mg by mouth every morning.     lisinopril-hydrochlorothiazide 20-12.5 MG tablet  Commonly known as:  PRINZIDE,ZESTORETIC  Take 1 tablet by mouth daily.     methocarbamol 500 MG tablet  Commonly known as:  ROBAXIN  Take 1 tablet (500 mg total) by mouth 4 (four) times daily.     metoprolol tartrate 25 MG tablet  Commonly known as:  LOPRESSOR  Take 25 mg by mouth 2 (two) times daily.     multivitamin with minerals tablet  Take 1 tablet by mouth daily.     NASAL SPRAY NA  Place 1 spray into both nostrils at bedtime as needed (congestion).     ondansetron 4 MG tablet  Commonly known as:  ZOFRAN  Take 1 tablet (4 mg total) by mouth every 8 (eight) hours as needed for nausea or vomiting.     oxyCODONE-acetaminophen 5-325 MG tablet  Commonly known as:  ROXICET  Take 1-2 tablets by mouth every 4 (four) hours as needed.     PROBIOTIC DAILY PO  Take 1 capsule by mouth daily.     tadalafil 5 MG tablet  Commonly known as:  CIALIS  Take 2.5 mg by mouth daily after supper.     tamsulosin 0.4 MG Caps capsule  Commonly known as:  FLOMAX  Take 0.4 mg by mouth daily before breakfast.     vitamin C 250 MG tablet  Commonly known as:  ASCORBIC ACID  Take 250 mg by mouth daily.     zolpidem 5 MG tablet  Commonly known as:  AMBIEN  Take 2.5 mg by mouth at bedtime as needed for sleep.        Diagnostic Studies:  Dg Knee Left Port  08/12/2015  CLINICAL DATA:  Postop  left knee EXAM: PORTABLE LEFT KNEE - 1-2 VIEW COMPARISON:  10/21/2013 FINDINGS: Status post revision knee arthroplasty. Associated surgical gas, surgical drain, and overlying skin staples. Moderate to large suprapatellar knee joint effusion. IMPRESSION: Status post revision knee arthroplasty with associated postsurgical changes. Electronically Signed   By: Julian Hy M.D.   On: 08/12/2015 12:23    Disposition: 81-Discharged to home/self-care with a planned acute care hospital inpt readmission    Follow-up Information    Follow up with [name], MD. Schedule an appointment as soon as possible for a visit in 2 weeks.   Specialty:  Orthopedic Surgery   Contact information:   [place] [address] [phone]       Follow up with [facility].   Why:  They will contact you to schedule home therapy and nurse visits.   Contact information:   [place] [address] [phone]        Signed: Fannie Knee 08/14/2015, 7:13 AM

## 2015-08-12 NOTE — Interval H&P Note (Signed)
History and Physical Interval Note:  08/12/2015 8:28 AM  Marc Maldonado  has presented today for surgery, with the diagnosis of DJD LEFT KNEE  The various methods of treatment have been discussed with the patient and family. After consideration of risks, benefits and other options for treatment, the patient has consented to  Procedure(s): TOTAL LEFT KNEE REVISION (Left) as a surgical intervention .  The patient's history has been reviewed, patient examined, no change in status, stable for surgery.  I have reviewed the patient's chart and labs.  Questions were answered to the patient's satisfaction.     [name]

## 2015-08-12 NOTE — Anesthesia Preprocedure Evaluation (Addendum)
Anesthesia Evaluation  Patient identified by MRN, date of birth, ID band Patient awake    Reviewed: Allergy & Precautions, H&P , NPO status , Patient's Chart, lab work & pertinent test results  Airway Mallampati: III  TM Distance: >3 FB Neck ROM: Full    Dental  (+) Teeth Intact   Pulmonary    Pulmonary exam normal - rhonchi       Cardiovascular hypertension,  Rhythm:Regular Rate:Normal     Neuro/Psych    GI/Hepatic GERD  ,  Endo/Other  obesity  Renal/GU Renal InsufficiencyRenal disease     Musculoskeletal  (+) Arthritis ,   Abdominal (+) + obese,   Peds  Hematology  (+) anemia ,   Anesthesia Other Findings   Reproductive/Obstetrics                            Anesthesia Physical  Anesthesia Plan  ASA: II  Anesthesia Plan: General   Post-op Pain Management:    Induction: Intravenous  Airway Management Planned: Oral ETT  Additional Equipment:   Intra-op Plan:   Post-operative Plan: Extubation in OR  Informed Consent: I have reviewed the patients History and Physical, chart, labs and discussed the procedure including the risks, benefits and alternatives for the proposed anesthesia with the patient or authorized representative who has indicated his/her understanding and acceptance.   Dental advisory given  Plan Discussed with: CRNA, Surgeon and Anesthesiologist  Anesthesia Plan Comments:         Anesthesia Quick Evaluation

## 2015-08-12 NOTE — H&P (View-Only) (Signed)
TOTAL KNEE REVISION ADMISSION H&P  Patient is being admitted for left revision total knee arthroplasty.  Subjective:  Chief Complaint:left knee pain.  HPI: Marc Maldonado, 69 y.o. male, has a history of pain and functional disability in the left knee(s) due to infection and patient has failed non-surgical conservative treatments for greater than 12 weeks to include NSAID's and/or analgesics, corticosteriod injections and use of assistive devices. The indications for the revision of the total knee arthroplasty are history of total knee infection. Onset of symptoms was gradual starting 2 years ago with rapidlly worsening course since that time.  Prior procedures on the left knee(s) include arthroplasty.  Patient currently rates pain in the left knee(s) at 6 out of 10 with activity. There is night pain, worsening of pain with activity and weight bearing, pain that interferes with activities of daily living, pain with passive range of motion and joint swelling.   This condition presents safety issues increasing the risk of falls. This patient has had infection.  There is no current active infection.  Patient Active Problem List   Diagnosis Date Noted  . DVT of axillary vein, acute right 02/26/2014  . Postoperative anemia due to acute blood loss 10/20/2013  . Normocytic anemia 10/18/2013  . Acute renal insufficiency 10/18/2013  . Hyperglycemia 10/18/2013  . Bacteremia due to Staphylococcus 10/18/2013  . 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 DJD (degenerative joint disease) of knee 10/02/2013  . Essential hypertension, benign 09/23/2013  . GERD (gastroesophageal reflux disease) 09/23/2013  . Failed total left knee replacement ([place]) 09/23/2013   Past Medical History  Diagnosis Date  . BPH (benign prostatic hyperplasia)   . Wears glasses   . Hypertension   . GERD (gastroesophageal reflux disease)     occas. indigestion   . Chronic kidney disease     renal  stone-(2) tumors removed fr. bladder  . 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 Arthritis     low back & neck,- DDD  . DJD (degenerative joint disease)     Past Surgical History  Procedure Laterality Date  . Total knee arthroplasty  2008    rt  . Total knee arthroplasty  2003    left  . Tonsillectomy    . Carpal tunnel release  2012    rt and lt  . Hernia repair  2007    umb hr  . Cystoscopy with biopsy      bladder tumor  . Colonoscopy    . Shoulder arthroscopy w/ rotator cuff repair  2010 & 2013    bilateral   . Total knee revision Right 07/24/2013    Procedure: TOTAL KNEE REVISION;  Surgeon: [name], MD;  Location: [place];  Service: Orthopedics;  Laterality: Right;  . Total knee revision Left 10/02/2013    Procedure: TOTAL KNEE REVISION, tibial component;  Surgeon: [name], MD;  Location: [place];  Service: Orthopedics;  Laterality: Left;  . Total knee revision Left 10/18/13  . I&d knee with poly exchange Left 10/18/2013    Procedure: IRRIGATION AND DEBRIDEMENT KNEE WITH POLY EXCHANGE;  Surgeon: Renette Butters, MD;  Location: [place];  Service: Orthopedics;  Laterality: Left;     (Not in a hospital admission) No Known Allergies  Social History  Substance Use Topics  . Smoking status: Never Smoker   . Smokeless tobacco: Never Used  .  Alcohol Use: No    No family history on file.    Review of Systems  Constitutional: Negative.   HENT: Negative.   Eyes: Negative.   Respiratory: Negative.   Cardiovascular: Negative.   Gastrointestinal: Negative.   Genitourinary: Negative.   Musculoskeletal: Positive for joint pain.  Skin: Negative.   Neurological: Negative.   Endo/Heme/Allergies: Bruises/bleeds easily.  Psychiatric/Behavioral: Negative.      Objective:  Physical Exam  Constitutional: He is oriented to person, place, and time. He appears well-developed and well-nourished.  HENT:  Head:  Normocephalic and atraumatic.  Eyes: EOM are normal. Pupils are equal, round, and reactive to light.  Neck: Normal range of motion. Neck supple.  Cardiovascular: Normal rate and regular rhythm.   Respiratory: Effort normal and breath sounds normal.  GI: Soft. Bowel sounds are normal.  Musculoskeletal:  He does have an effusion of the left knee, but it is not that marked.  All of his erythema and swelling at the distal end of the incision is pretty much gone.  Neurovascularly intact distally.  His knee motion is 0-120.  Good stability  Neurological: He is alert and oriented to person, place, and time.  Skin: Skin is warm and dry.  Psychiatric: He has a normal mood and affect. His behavior is normal. Judgment and thought content normal.    Vital signs in last 24 hours: @VSRANGES @  Labs:  Estimated body mass index is 35.07 kg/(m^2) as calculated from the following:   Height as of 07/21/15: 5' 10.5" (1.791 m).   Weight as of 07/21/15: 112.492 kg (248 lb).  Imaging Review Plain radiographs demonstrate moderate degenerative joint disease of the left knee(s). The overall alignment is neutral. components. The bone quality appears to be fair for age and reported activity level.  Assessment/Plan:  End stage arthritis, left knee(s) with failed previous arthroplasty.   The patient history, physical examination, clinical judgment of the provider and imaging studies are consistent with end stage degenerative joint disease of the left knee(s), previous total knee arthroplasty. Revision total knee arthroplasty is deemed medically necessary. The treatment options including medical management, injection therapy, arthroscopy and revision arthroplasty were discussed at length. The risks and benefits of revision total knee arthroplasty were presented and reviewed. The risks due to aseptic loosening, infection, stiffness, patella tracking problems, thromboembolic complications and other imponderables were  discussed. The patient acknowledged the explanation, agreed to proceed with the plan and consent was signed. Patient is being admitted for inpatient treatment for surgery, pain control, PT, OT, prophylactic antibiotics, VTE prophylaxis, progressive ambulation and ADL's and discharge planning.The patient is planning to be discharged home with home health services

## 2015-08-12 NOTE — Evaluation (Signed)
Physical Therapy Evaluation Patient Details Name: Marc Maldonado MRN: [id number] DOB: Sep 28, 1946 Today's Date: 08/12/2015   History of Present Illness  Pt is a 68 y/o M s/p Lt TKA revision.  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  Clinical Impression  Patient is s/p above surgery resulting in functional limitations due to the deficits listed below (see PT Problem List). Marc Maldonado have prn assist available from wife at d/c and has two flight of stairs to get to his bedroom on the 3rd floor. He was at supervision level for sit<>stand and ambulation in room today. Patient Maldonado benefit from skilled PT to increase their independence and safety with mobility to allow discharge to the venue listed below.      Follow Up Recommendations Home health PT;Supervision for mobility/OOB    Equipment Recommendations  None recommended by PT    Recommendations for Other Services OT consult     Precautions / Restrictions Precautions Precautions: Fall;Knee Precaution Comments: Educated pt to maintain Lt knee Extension at all times.  Per order: no ROM Lt knee Required Braces or Orthoses: Knee Immobilizer - Left Knee Immobilizer - Left: Other (comment) (on pt upon arrival, no order) Restrictions Weight Bearing Restrictions: Yes LLE Weight Bearing: Weight bearing as tolerated      Mobility  Bed Mobility Overal bed mobility: Modified Independent             General bed mobility comments: Use of bed rail and cues for technique.  Increased time.  Transfers Overall transfer level: Needs assistance Equipment used: Rolling walker (2 wheeled) Transfers: Sit to/from Stand Sit to Stand: Supervision         General transfer comment: Supervision for safety.  Pt recalls technique and is safe w/ transfer.  Ambulation/Gait Ambulation/Gait assistance: Supervision Ambulation Distance (Feet): 35 Feet Assistive device:  Rolling walker (2 wheeled) Gait Pattern/deviations: Step-to pattern;Antalgic;Trunk flexed;Decreased weight shift to left;Decreased stride length;Decreased stance time - left;Decreased step length - right   Gait velocity interpretation: Below normal speed for age/gender General Gait Details: Weight shift standing EOB prior to ambulation.  Cues for upright posture as pt demonstrates trunk flexion.    Stairs            Wheelchair Mobility    Modified Rankin (Stroke Patients Only)       Balance Overall balance assessment: Needs assistance Sitting-balance support: Feet supported;Bilateral upper extremity supported Sitting balance-Leahy Scale: Good     Standing balance support: Bilateral upper extremity supported;During functional activity Standing balance-Leahy Scale: Poor Standing balance comment: Relies on RW for support                             Pertinent Vitals/Pain Pain Assessment: 0-10 Pain Score: 3  Pain Location: Lt knee Pain Descriptors / Indicators: Aching;Sore Pain Intervention(s): Limited activity within patient's tolerance;Monitored during session;Repositioned    Home Living Family/patient expects to be discharged to:: Private residence Living Arrangements: Spouse/significant other Available Help at Discharge: Family;Available PRN/intermittently (wife home most of day, 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) Type of Home: House Home Access: Level entry     Home Layout: Multi-level (3 stories) Home Equipment: Walker - 2 wheels;Cane - single point;Bedside commode;Crutches      Prior Function Level of Independence: Independent               Hand Dominance  Extremity/Trunk Assessment   Upper Extremity Assessment: Defer to OT evaluation           Lower Extremity Assessment: LLE deficits/detail   LLE Deficits / Details: limited ROM and strength as expected s/p Lt TKA revision     Communication   Communication: No  difficulties  Cognition Arousal/Alertness: Awake/alert Behavior During Therapy: WFL for tasks assessed/performed Overall Cognitive Status: Within Functional Limits for tasks assessed                      General Comments      Exercises Total Joint Exercises Ankle Circles/Pumps: AROM;Both;15 reps;Seated Quad Sets: Strengthening;Both;10 reps;Supine      Assessment/Plan    PT Assessment Patient needs continued PT services  PT Diagnosis Difficulty walking;Abnormality of gait;Generalized weakness;Acute pain   PT Problem List Decreased strength;Decreased range of motion;Decreased activity tolerance;Decreased balance;Decreased mobility;Decreased knowledge of use of DME;Decreased safety awareness;Decreased knowledge of precautions;Decreased skin integrity;Pain  PT Treatment Interventions DME instruction;Gait training;Stair training;Functional mobility training;Therapeutic activities;Therapeutic exercise;Balance training;Neuromuscular re-education;Patient/family education;Modalities   PT Goals (Current goals can be found in the Care Plan section) Acute Rehab PT Goals Patient Stated Goal: to get back to being independent as soon as possible PT Goal Formulation: With patient Time For Goal Achievement: 08/19/15 Potential to Achieve Goals: Good    Frequency 7X/week   Barriers to discharge Inaccessible home environment 2 flights of steps to get to bedroom    Co-evaluation               End of Session Equipment Utilized During Treatment: Gait belt;Left knee immobilizer Activity Tolerance: Patient tolerated treatment well Patient left: in chair;with call bell/phone within reach Nurse Communication: Mobility status;Precautions;Weight bearing status (no Lt knee ROM)         Time: 0258-5277 PT Time Calculation (min) (ACUTE ONLY): 25 min   Charges:   PT Evaluation $Initial PT Evaluation Tier I: 1 Procedure PT Treatments $Gait Training: 8-22 mins   PT G CodesJoslyn Hy PT, DPT [phone] Pager: [phone] 08/12/2015, 5:26 PM

## 2015-08-12 NOTE — Anesthesia Postprocedure Evaluation (Signed)
  Anesthesia Post-op Note  Patient: Marc Maldonado  Procedure(s) Performed: Procedure(s) (LRB): LEFT TOTAL KNEE REVISION (Left)  Patient Location: PACU  Anesthesia Type: General  Level of Consciousness: awake and alert   Airway and Oxygen Therapy: Patient Spontanous Breathing  Post-op Pain: mild  Post-op Assessment: Post-op Vital signs reviewed, Patient's Cardiovascular Status Stable, Respiratory Function Stable, Patent Airway and No signs of Nausea or vomiting  Last Vitals:  Filed Vitals:   08/12/15 1130  BP:   Pulse: 68  Temp:   Resp: 16    Post-op Vital Signs: stable   Complications: No apparent anesthesia complications

## 2015-08-13 ENCOUNTER — Encounter (HOSPITAL_COMMUNITY): Payer: Self-pay | Admitting: Orthopedic Surgery

## 2015-08-13 DIAGNOSIS — T8454XD Infection and inflammatory reaction due to internal left knee prosthesis, subsequent encounter: Secondary | ICD-10-CM

## 2015-08-13 LAB — CBC
HEMATOCRIT: 33.7 % — AB (ref 39.0–52.0)
Hemoglobin: 11.2 g/dL — ABNORMAL LOW (ref 13.0–17.0)
MCH: 31.9 pg (ref 26.0–34.0)
MCHC: 33.2 g/dL (ref 30.0–36.0)
MCV: 96 fL (ref 78.0–100.0)
PLATELETS: 190 10*3/uL (ref 150–400)
RBC: 3.51 MIL/uL — AB (ref 4.22–5.81)
RDW: 13.5 % (ref 11.5–15.5)
WBC: 8.5 10*3/uL (ref 4.0–10.5)

## 2015-08-13 LAB — BASIC METABOLIC PANEL
ANION GAP: 7 (ref 5–15)
BUN: 12 mg/dL (ref 6–20)
CO2: 28 mmol/L (ref 22–32)
Calcium: 8.6 mg/dL — ABNORMAL LOW (ref 8.9–10.3)
Chloride: 103 mmol/L (ref 101–111)
Creatinine, Ser: 1.15 mg/dL (ref 0.61–1.24)
GLUCOSE: 135 mg/dL — AB (ref 65–99)
POTASSIUM: 3.9 mmol/L (ref 3.5–5.1)
Sodium: 138 mmol/L (ref 135–145)

## 2015-08-13 LAB — HIV ANTIBODY (ROUTINE TESTING W REFLEX): HIV Screen 4th Generation wRfx: NONREACTIVE

## 2015-08-13 LAB — SEDIMENTATION RATE: SED RATE: 14 mm/h (ref 0–16)

## 2015-08-13 LAB — C-REACTIVE PROTEIN

## 2015-08-13 MED ORDER — APIXABAN 2.5 MG PO TABS: ORAL_TABLET | ORAL | Status: DC

## 2015-08-13 NOTE — Progress Notes (Signed)
Physical Therapy Treatment Patient Details Name: Marc Maldonado MRN: [id number] DOB: 1946/05/17 Today's Date: 08/13/2015    History of Present Illness Pt is a 69 y/o M s/p Lt TKA revision.  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    PT Comments    Initiated stair training today on practice steps in gym. Pt did well with S and tried with cane and 1 rail and sideways with 1 rail. He is going to think about what he likes better.  Would benefit from trial in stairwell if IV is d/c or can be disconnected for PT session. Con't to recommend HHPT for home environment assessment as pt and wife have many questions re: home environment.  Follow Up Recommendations  Home health PT;Supervision for mobility/OOB     Equipment Recommendations  None recommended by PT    Recommendations for Other Services       Precautions / Restrictions Precautions Precautions: Fall;Knee Precaution Comments: no ROM L knee Required Braces or Orthoses: Knee Immobilizer - Left Knee Immobilizer - Left: On at all times Restrictions Weight Bearing Restrictions: Yes LLE Weight Bearing: Weight bearing as tolerated    Mobility  Bed Mobility Overal bed mobility: Modified Independent                Transfers Overall transfer level: Needs assistance Equipment used: Rolling walker (2 wheeled) Transfers: Sit to/from Stand Sit to Stand: Supervision         General transfer comment: cues for hands.  A with donning shorts.  Ambulation/Gait Ambulation/Gait assistance: Supervision Ambulation Distance (Feet): 120 Feet Assistive device: Rolling walker (2 wheeled) Gait Pattern/deviations: Decreased stance time - left;Decreased step length - right;Antalgic;Trunk flexed     General Gait Details: Cues for posture, improved cadence   Stairs Stairs: Yes Stairs assistance: Supervision Stair Management: One rail Left;With cane Number of  Stairs: 4 General stair comments: Pt practiced going up with L rail and cane forwards and also 1 rail sideways.  Wife present and educated on her taking RW to destination for pt.  Wheelchair Mobility    Modified Rankin (Stroke Patients Only)       Balance Overall balance assessment: Needs assistance   Sitting balance-Leahy Scale: Good     Standing balance support: During functional activity Standing balance-Leahy Scale: Fair Standing balance comment: Able to let go of RW with 1 UE to A with donning shorts                    Cognition Arousal/Alertness: Awake/alert Behavior During Therapy: WFL for tasks assessed/performed Overall Cognitive Status: Within Functional Limits for tasks assessed                      Exercises Total Joint Exercises Ankle Circles/Pumps: AROM;Both;15 reps;Seated    General Comments        Pertinent Vitals/Pain Pain Assessment: 0-10 Pain Score: 4  Pain Location: L knee Pain Descriptors / Indicators: Aching Pain Intervention(s): Monitored during session;Limited activity within patient's tolerance    Home Living Family/patient expects to be discharged to:: Private residence Living Arrangements: Spouse/significant other Available Help at Discharge: Family;Available PRN/intermittently Type of Home: House Home Access: Level entry   Home Layout: Multi-level Home Equipment: Walker - 2 wheels;Cane - single point;Bedside commode;Crutches;Shower seat - built in      Prior Function Level of Independence: Independent  PT Goals (current goals can now be found in the care plan section) Acute Rehab PT Goals Patient Stated Goal: to get back to being independent as soon as possible PT Goal Formulation: With patient Time For Goal Achievement: 08/19/15 Potential to Achieve Goals: Good Progress towards PT goals: Progressing toward goals    Frequency  7X/week    PT Plan Current plan remains appropriate    Co-evaluation              End of Session Equipment Utilized During Treatment: Gait belt;Left knee immobilizer Activity Tolerance: Patient tolerated treatment well Patient left: in chair;with call bell/phone within reach;with family/visitor present     Time: 6168-3729 PT Time Calculation (min) (ACUTE ONLY): 32 min  Charges:  $Gait Training: 23-37 mins                    G Codes:      Divine Hansley LUBECK 08/13/2015, 12:01 PM

## 2015-08-13 NOTE — Progress Notes (Signed)
Subjective: 1 Day Post-Op Procedure(s) (LRB): LEFT TOTAL KNEE REVISION (Left) Patient reports pain as mild.  Patient reports mild nausea yesterday which resolved.  No vomiting.  No lightheadedness/dizziness, chest pain/sob.  Tolerating diet.  Objective: Vital signs in last 24 hours: Temp:  [97.5 F (36.4 C)-98.9 F (37.2 C)] 98 F (36.7 C) (10/20 0500) Pulse Rate:  [55-74] 59 (10/20 0500) Resp:  [9-20] 20 (10/20 0500) BP: (140-170)/(76-104) 140/78 mmHg (10/20 0500) SpO2:  [94 %-100 %] 94 % (10/20 0500)  Intake/Output from previous day: 10/19 0701 - 10/20 0700 In: 2200 [I.V.:2200] Out: 2435 [Urine:1900; Drains:210; Blood:325] Intake/Output this shift:     Recent Labs  08/13/15 0255  HGB 11.2*    Recent Labs  08/13/15 0255  WBC 8.5  RBC 3.51*  HCT 33.7*  PLT 190    Recent Labs  08/13/15 0255  NA 138  K 3.9  CL 103  CO2 28  BUN 12  CREATININE 1.15  GLUCOSE 135*  CALCIUM 8.6*   No results for input(s): LABPT, INR in the last 72 hours.  Neurologically intact Neurovascular intact Sensation intact distally Intact pulses distally Dorsiflexion/Plantar flexion intact Compartment soft  hemovac drain pulled by me today  Assessment/Plan: 1 Day Post-Op Procedure(s) (LRB): LEFT TOTAL KNEE REVISION (Left) Advance diet Up with therapy  WBAT LLE-must be in knee immobilizer at all times.  NO ROM L knee Awaiting intra-op cx picc line to be placed LUE  Continue abx per ID-  Would appreciate if ID would write d/c abx rx  Dry dressing change prn  Fannie Knee 08/13/2015, 7:10 AM

## 2015-08-13 NOTE — Progress Notes (Signed)
Occupational Therapy Evaluation Patient Details Name: Marc SHROUT MRN: [id number] DOB: [birth year] Today's Date: 08/13/2015    History of Present Illness Pt is a 69 y/o M s/p Lt TKA revision.  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   Clinical Impression   Patient presents to OT with decreased ADL independence due to the deficits listed below. All education completed and patient has no further OT needs. Wife to assist patient with ADLs as needed at home.    Follow Up Recommendations  No OT follow up;Supervision - Intermittent    Equipment Recommendations  None recommended by OT    Recommendations for Other Services       Precautions / Restrictions Precautions Precautions: Fall;Knee Precaution Comments: no ROM L knee Required Braces or Orthoses: Knee Immobilizer - Left Knee Immobilizer - Left: On at all times Restrictions Weight Bearing Restrictions: Yes LLE Weight Bearing: Weight bearing as tolerated      Mobility Bed Mobility                  Transfers                      Balance                                            ADL Overall ADL's : Needs assistance/impaired Eating/Feeding: Independent;Sitting   Grooming: Set up;Sitting   Upper Body Bathing: Set up;Sitting   Lower Body Bathing: Minimal assistance;Moderate assistance;Sit to/from stand   Upper Body Dressing : Set up;Sitting   Lower Body Dressing: Minimal assistance;Moderate assistance   Toilet Transfer: Supervision/safety             General ADL Comments: Patient's wife also present during evaluation. Patient had a lot of questions regarding toileting. He stays mostly on 1st level of 3-level home where there is no bathroom. He uses his bathroom on the 3rd level of the home. Problem solved to use urinal most of the time and then go to 3rd level bathroom and use 3 in 1 over toilet.  Patient and wife verbalized understanding. Discussed LB dressing technique of donning pants over operated leg first. Patient recalled that from prior surgeries. Patient also educated on getting a long sponge to assist with bathing his L foot at home. Wife reports she will purchase. Discussed asking MD when he could shower; currently has KI at all times and to get PICC line today so may not be able to shower anytime soon. Patient reports he will sponge bathe until he is permitted to shower. No further OT needs; will sign off.     Vision     Perception     Praxis      Pertinent Vitals/Pain Pain Assessment: 0-10 Pain Score: 3  Pain Location: L knee Pain Descriptors / Indicators: Aching;Sore Pain Intervention(s): Limited activity within patient's tolerance;Monitored during session     Hand Dominance Right   Extremity/Trunk Assessment Upper Extremity Assessment Upper Extremity Assessment: Overall WFL for tasks assessed   Lower Extremity Assessment Lower Extremity Assessment: Defer to PT evaluation       Communication Communication Communication: No difficulties   Cognition Arousal/Alertness: Awake/alert Behavior During Therapy: WFL for tasks assessed/performed Overall Cognitive Status: Within Functional Limits for tasks assessed  General Comments       Exercises       Shoulder Instructions      Home Living Family/patient expects to be discharged to:: Private residence Living Arrangements: Spouse/significant other Available Help at Discharge: Family;Available PRN/intermittently Type of Home: House Home Access: Level entry     Home Layout: Multi-level Alternate Level Stairs-Number of Steps: 12 Alternate Level Stairs-Rails: Left Bathroom Shower/Tub: Tub/shower unit;Walk-in shower Shower/tub characteristics: Door Bathroom Toilet: Handicapped height Bathroom Accessibility: Yes How Accessible: Accessible via walker Home Equipment: [place] -  2 wheels;Cane - single point;Bedside commode;Crutches;Shower seat - built in          Prior Functioning/Environment Level of Independence: Independent             OT Diagnosis: Acute pain   OT Problem List: Decreased strength;Decreased range of motion;Decreased knowledge of use of DME or AE;Pain   OT Treatment/Interventions:      OT Goals(Current goals can be found in the care plan section) Acute Rehab OT Goals Patient Stated Goal: to get back to being independent as soon as possible OT Goal Formulation: All assessment and education complete, DC therapy  OT Frequency:     Barriers to D/C:            Co-evaluation              End of Session    Activity Tolerance: Patient tolerated treatment well Patient left: in chair;with call bell/phone within reach;with family/visitor present   Time: 1101-1117 OT Time Calculation (min): 16 min Charges:  OT General Charges $OT Visit: 1 Procedure OT Evaluation $Initial OT Evaluation Tier I: 1 Procedure G-Codes:    Marc Maldonado A 09/10/2015, 11:28 AM

## 2015-08-13 NOTE — Care Management Note (Signed)
Case Management Note  Patient Details  Name: VADA YELLEN MRN: [id number] Date of Birth: [birth year]  Subjective/Objective:      S/p left total knee revision              Action/Plan: Spoke with patient and his wife about home health and home IV antibiotics. They chose [place]. Patient has had IV antibiotics at home in the past. [facility] with [facility] and set up [facility], HHPT and IV antibiotics. Patient stated that he has a rolling walker and a 3N1 at home. Will continue to follow for discharge needs.  Expected Discharge Date:                  Expected Discharge Plan:  [place]  In-House Referral:  NA  Discharge planning Services  CM Consult  Post Acute Care Choice:  Home Health Choice offered to:  Patient  DME Arranged:    DME Agency:     HH Arranged:  RN, PT HH Agency:  [place]  Status of Service:  Completed, signed off  Medicare Important Message Given:    Date Medicare IM Given:    Medicare IM give by:    Date Additional Medicare IM Given:    Additional Medicare Important Message give by:     If discussed at [place] of Stay Meetings, dates discussed:    Additional Comments:  Nila Nephew, RN 08/13/2015, 3:21 PM

## 2015-08-13 NOTE — Progress Notes (Signed)
Utilization review completed.  

## 2015-08-13 NOTE — Progress Notes (Signed)
[facility] for Infectious Disease    Subjective: No new complaints. Doing well this morning. Has been out of bed. Pain controlled.    Antibiotics:  Anti-infectives    Start     Dose/Rate Route Frequency Ordered Stop   08/12/15 2100  vancomycin (VANCOCIN) IVPB 1000 mg/200 mL premix  Status:  Discontinued     1,000 mg 200 mL/hr over 60 Minutes Intravenous Every 12 hours 08/12/15 1314 08/12/15 1538   08/12/15 1545  ceFAZolin (ANCEF) IVPB 2 g/50 mL premix     2 g 100 mL/hr over 30 Minutes Intravenous 3 times per day 08/12/15 1540     08/12/15 1315  cephALEXin (KEFLEX) capsule 500 mg  Status:  Discontinued     500 mg Oral 2 times daily 08/12/15 1314 08/12/15 1356   08/12/15 0647  vancomycin (VANCOCIN) 1 GM/200ML IVPB    Comments:  Forte, Lindsi   : cabinet override      08/12/15 0647 08/12/15 1008   08/12/15 0645  vancomycin (VANCOCIN) IVPB 1000 mg/200 mL premix  Status:  Discontinued     1,000 mg 200 mL/hr over 60 Minutes Intravenous  Once 08/12/15 0643 08/12/15 1314      Medications: Scheduled Meds: . apixaban  2.5 mg Oral Q12H  .  ceFAZolin (ANCEF) IV  2 g Intravenous 3 times per day  . celecoxib  200 mg Oral Q12H  . diltiazem  240 mg Oral Daily  . docusate sodium  100 mg Oral BID  . ferrous sulfate  325 mg Oral Q breakfast  . lisinopril  20 mg Oral Daily   And  . hydrochlorothiazide  12.5 mg Oral Daily  . metoprolol tartrate  25 mg Oral BID  . tamsulosin  0.4 mg Oral QAC breakfast   Continuous Infusions:  PRN Meds:.acetaminophen **OR** acetaminophen, bisacodyl, famotidine **AND** calcium carbonate, diphenhydrAMINE, HYDROmorphone (DILAUDID) injection, magnesium citrate, menthol-cetylpyridinium **OR** phenol, methocarbamol **OR** methocarbamol (ROBAXIN)  IV, metoCLOPramide **OR** metoCLOPramide (REGLAN) injection, ondansetron **OR** ondansetron (ZOFRAN) IV, oxyCODONE, polyethylene glycol, zolpidem    Objective: Weight change:   Intake/Output Summary  (Last 24 hours) at 08/13/15 0946 Last data filed at 08/13/15 0700  Gross per 24 hour  Intake   1440 ml  Output   2435 ml  Net   -995 ml   Blood pressure 140/78, pulse 59, temperature 98 F (36.7 C), temperature source Oral, resp. rate 20, height _0  (1.778 m), weight 248 lb 12.8 oz (112.855 kg), SpO2 94 %. Temp:  [97.5 F (36.4 C)-98.9 F (37.2 C)] 98 F (36.7 C) (10/20 0500) Pulse Rate:  [55-74] 59 (10/20 0500) Resp:  [9-20] 20 (10/20 0500) BP: (140-170)/(76-104) 140/78 mmHg (10/20 0500) SpO2:  [94 %-100 %] 94 % (10/20 0500)  Physical Exam: General: Alert and awake, oriented x3, not in any acute distress. HEENT: anicteric sclera, EOMI, oropharynx clear and without exudate Cardiovascular: regular rate, normal r, no murmur rubs or gallops Pulmonary: clear to auscultation bilaterally, no wheezing, rales or rhonchi Gastrointestinal: soft nontender, nondistended, normal bowel sounds, Musculoskeletal: no clubbing or edema noted bilaterally. L knee in surgical dressing. Skin, soft tissue: no rashes Neuro: nonfocal, strength and sensation intact  CBC:    Component Value Date/Time   WBC 8.5 08/13/2015 0255   RBC 3.51* 08/13/2015 0255   HGB 11.2* 08/13/2015 0255   HCT 33.7* 08/13/2015 0255   PLT 190 08/13/2015 0255   MCV 96.0 08/13/2015 0255   MCH 31.9 08/13/2015 0255  MCHC 33.2 08/13/2015 0255   RDW 13.5 08/13/2015 0255   LYMPHSABS 1.2 07/31/2015 1037   MONOABS 0.4 07/31/2015 1037   EOSABS 0.1 07/31/2015 1037   BASOSABS 0.0 07/31/2015 1037    BMET  Recent Labs  08/13/15 0255  NA 138  K 3.9  CL 103  CO2 28  GLUCOSE 135*  BUN 12  CREATININE 1.15  CALCIUM 8.6*    Sedimentation Rate  Recent Labs  08/13/15 0255  ESRSEDRATE 14    C-Reactive Protein  Recent Labs  08/13/15 0255  CRP <0.5    Micro Results: Recent Results (from the past 720 hour(s))  Urine culture     Status: None   Collection Time: 07/31/15 10:36 AM  Result Value Ref Range Status    Specimen Description URINE, CLEAN CATCH  Final   Special Requests NONE  Final   Culture   Final    40,000 COLONIES/ml STAPHYLOCOCCUS SPECIES (COAGULASE NEGATIVE)   Report Status 08/02/2015 FINAL  Final   Organism ID, Bacteria STAPHYLOCOCCUS SPECIES (COAGULASE NEGATIVE)  Final      Susceptibility   Staphylococcus species (coagulase negative) - MIC*    CIPROFLOXACIN >=8 RESISTANT Resistant     GENTAMICIN >=16 RESISTANT Resistant     NITROFURANTOIN <=16 SENSITIVE Sensitive     OXACILLIN >=4 RESISTANT Resistant     TETRACYCLINE 2 SENSITIVE Sensitive     VANCOMYCIN 1 SENSITIVE Sensitive     TRIMETH/SULFA >=320 RESISTANT Resistant     CLINDAMYCIN <=0.25 SENSITIVE Sensitive     RIFAMPIN <=0.5 SENSITIVE Sensitive     Inducible Clindamycin NEGATIVE Sensitive     * 40,000 COLONIES/ml STAPHYLOCOCCUS SPECIES (COAGULASE NEGATIVE)  Surgical pcr screen     Status: None   Collection Time: 07/31/15 11:02 AM  Result Value Ref Range Status   MRSA, PCR NEGATIVE NEGATIVE Final   Staphylococcus aureus NEGATIVE NEGATIVE Final    Comment:        The Xpert SA Assay (FDA approved for NASAL specimens in patients over 50 years of age), is one component of a comprehensive surveillance program.  Test performance has been validated by [facility] for patients greater than or equal to 12 year old. It is not intended to diagnose infection nor to guide or monitor treatment.   Anaerobic culture     Status: None (Preliminary result)   Collection Time: 08/12/15  9:13 AM  Result Value Ref Range Status   Specimen Description WOUND KNEE LEFT  Final   Special Requests   Final    SPEC A SPEC ON SWAB DJD LEFT KNEE FLUID AROUND KNEE   Gram Stain   Final    RARE WBC PRESENT, PREDOMINANTLY PMN NO SQUAMOUS EPITHELIAL CELLS SEEN NO ORGANISMS SEEN Performed at [facility]    Culture PENDING  Incomplete   Report Status PENDING  Incomplete  Wound culture     Status: None (Preliminary result)    Collection Time: 08/12/15  9:13 AM  Result Value Ref Range Status   Specimen Description WOUND KNEE LEFT  Final   Special Requests SPEC A ON SWAB DJD LEFT KNEE FLUID AROUND KNEE  Final   Gram Stain   Final    RARE WBC PRESENT, PREDOMINANTLY PMN NO SQUAMOUS EPITHELIAL CELLS SEEN NO ORGANISMS SEEN Performed at [facility]    Culture PENDING  Incomplete   Report Status PENDING  Incomplete    Studies/Results: Dg Knee Left Port  08/12/2015  CLINICAL DATA:  Postop left knee EXAM: PORTABLE LEFT  KNEE - 1-2 VIEW COMPARISON:  10/21/2013 FINDINGS: Status post revision knee arthroplasty. Associated surgical gas, surgical drain, and overlying skin staples. Moderate to large suprapatellar knee joint effusion. IMPRESSION: Status post revision knee arthroplasty with associated postsurgical changes. Electronically Signed   By: Julian Hy M.D.   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  MSSA prosthetic joint infection: Infection of revision L TKA in 10/02/2013 with subsequent I&D and polyethylene exchange 10/18/2013. Remained with persistent pain. Now s/p first of two stage resection arthroplasty with reimplantation.  -Continue on and discharge with IV cefazolin 2g q8hr. Will continue this for a 6 week course ending November 30. -ESR, CRP unremarkable. HIV screen pending -May consider 2 weeks of antibiotic holiday following course of antibiotics, then joint aspiration  with cell count and culture. -PICC placement ordered.  Jacques Earthly, MD  Internal Medicine PGY-2 08/13/2015, 9:45 AM

## 2015-08-14 LAB — CBC
HCT: 30.3 % — ABNORMAL LOW (ref 39.0–52.0)
HEMOGLOBIN: 9.9 g/dL — AB (ref 13.0–17.0)
MCH: 31.7 pg (ref 26.0–34.0)
MCHC: 32.7 g/dL (ref 30.0–36.0)
MCV: 97.1 fL (ref 78.0–100.0)
PLATELETS: 147 10*3/uL — AB (ref 150–400)
RBC: 3.12 MIL/uL — AB (ref 4.22–5.81)
RDW: 14 % (ref 11.5–15.5)
WBC: 6.1 10*3/uL (ref 4.0–10.5)

## 2015-08-14 LAB — BASIC METABOLIC PANEL
Anion gap: 7 (ref 5–15)
BUN: 12 mg/dL (ref 6–20)
CO2: 30 mmol/L (ref 22–32)
Calcium: 8.7 mg/dL — ABNORMAL LOW (ref 8.9–10.3)
Chloride: 103 mmol/L (ref 101–111)
Creatinine, Ser: 1.23 mg/dL (ref 0.61–1.24)
GFR calc non Af Amer: 59 mL/min — ABNORMAL LOW (ref >60)
GLUCOSE: 94 mg/dL (ref 65–99)
Potassium: 3.9 mmol/L (ref 3.5–5.1)
SODIUM: 140 mmol/L (ref 135–145)

## 2015-08-14 LAB — HCV COMMENT:

## 2015-08-14 LAB — HEPATITIS C ANTIBODY (REFLEX): HCV Ab: <0.1 s/co ratio (ref 0.0–0.9)

## 2015-08-14 MED ORDER — SODIUM CHLORIDE 0.9 % IJ SOLN
10.0000 mL | INTRAMUSCULAR | Status: DC | PRN
  Administered 2015-08-14: 10 mL

## 2015-08-14 MED ORDER — HEPARIN SOD (PORK) LOCK FLUSH 100 UNIT/ML IV SOLN
250.0000 [IU] | INTRAVENOUS | Status: AC | PRN
  Administered 2015-08-14: 250 [IU]

## 2015-08-14 MED ORDER — SODIUM CHLORIDE 0.9 % IJ SOLN: 10.0000 mL | Freq: Two times a day (BID) | INTRAMUSCULAR | Status: DC

## 2015-08-14 NOTE — Progress Notes (Signed)
Subjective: 2 Days Post-Op Procedure(s) (LRB): LEFT TOTAL KNEE REVISION (Left) Patient reports pain as mild.  No nausea/vomiting, lightheadedness/dizziness, chest pain/sob.  positiv flatus but no bm.  Tolerating diet.  Acute urinary retention has resolved.  Objective: Vital signs in last 24 hours: Temp:  [97.8 F (36.6 C)-98.2 F (36.8 C)] 98.2 F (36.8 C) (10/21 0600) Pulse Rate:  [51-60] 57 (10/21 0600) Resp:  [18-20] 18 (10/21 0600) BP: (125-158)/(74-82) 156/75 mmHg (10/21 0600) SpO2:  [96 %-99 %] 97 % (10/21 0600)  Intake/Output from previous day: 10/20 0701 - 10/21 0700 In: 240 [P.O.:240] Out: 100 [Urine:100] Intake/Output this shift:     Recent Labs  08/13/15 0255 08/14/15 0515  HGB 11.2* 9.9*    Recent Labs  08/13/15 0255 08/14/15 0515  WBC 8.5 6.1  RBC 3.51* 3.12*  HCT 33.7* 30.3*  PLT 190 147*    Recent Labs  08/13/15 0255 08/14/15 0515  NA 138 140  K 3.9 3.9  CL 103 103  CO2 28 30  BUN 12 12  CREATININE 1.15 1.23  GLUCOSE 135* 94  CALCIUM 8.6* 8.7*   No results for input(s): LABPT, INR in the last 72 hours.  Neurologically intact Neurovascular intact Sensation intact distally Intact pulses distally Dorsiflexion/Plantar flexion intact Compartment soft  Knee immobilizer in place Negative homans bilaterally  Assessment/Plan: 2 Days Post-Op Procedure(s) (LRB): LEFT TOTAL KNEE REVISION (Left) Advance diet Up with therapy Discharge home with home health today WBAT LLE-must be in knee immobilizer at all times.  NO ROM L knee ABLA-mild and stable Awaiting intra-op cx Awaiting picc line Continue abx per ID.  Would greatly appreciate if ID could write d/c abx rx Will be on eliquis x 6 weeks for dvt ppx  Fannie Knee 08/14/2015, 7:29 AM

## 2015-08-14 NOTE — Progress Notes (Signed)
Peripherally Inserted Central Catheter/Midline Placement  The IV Nurse has discussed with the patient and/or persons authorized to consent for the patient, the purpose of this procedure and the potential benefits and risks involved with this procedure.  The benefits include less needle sticks, lab draws from the catheter and patient may be discharged home with the catheter.  Risks include, but not limited to, infection, bleeding, blood clot (thrombus formation), and puncture of an artery; nerve damage and irregular heat beat.  Alternatives to this procedure were also discussed.  PICC/Midline Placement Documentation  PICC / Midline Single Lumen 80/32/12 PICC Right Basilic 41 cm 0 cm (Active)     PICC / Midline Single Lumen 24/82/50 PICC Left Basilic 46 cm 0 cm (Active)  Indication for Insertion or Continuance of Line Home intravenous therapies (PICC only) 08/14/2015  1:55 PM  Exposed Catheter (cm) 0 cm 08/14/2015  1:55 PM  Dressing Change Due 08/21/15 08/14/2015  1:55 PM       Marianna Payment M 08/14/2015, 1:59 PM

## 2015-08-14 NOTE — Progress Notes (Signed)
Physical Therapy Treatment Patient Details Name: PENIEL HASS MRN: [id number] DOB: 06-Feb-1946 Today's Date: 08/14/2015    History of Present Illness Pt is a 69 y/o M s/p Lt TKA revision.  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    PT Comments    Pt able to negotiate 12 stairs with 1 rail and cane with S.  Pt with good recall of technique.  Pt is scheduled to d/c home today.  Follow Up Recommendations  Home health PT;Supervision for mobility/OOB     Equipment Recommendations  None recommended by PT    Recommendations for Other Services       Precautions / Restrictions Precautions Precautions: Fall;Knee Precaution Comments: no ROM L knee Required Braces or Orthoses: Knee Immobilizer - Left Knee Immobilizer - Left: On at all times Restrictions Weight Bearing Restrictions: Yes LLE Weight Bearing: Weight bearing as tolerated    Mobility  Bed Mobility               General bed mobility comments: up in recliner upon arrival  Transfers Overall transfer level: Needs assistance Equipment used: Rolling walker (2 wheeled) Transfers: Sit to/from Stand Sit to Stand: Supervision         General transfer comment: improved hand placement today  Ambulation/Gait Ambulation/Gait assistance: Supervision Ambulation Distance (Feet): 250 Feet Assistive device: Rolling walker (2 wheeled) Gait Pattern/deviations: Decreased stance time - left;Decreased step length - right;Trunk flexed;Step-through pattern Gait velocity: decreased Gait velocity interpretation: Below normal speed for age/gender General Gait Details: Improved posture today   Stairs Stairs: Yes Stairs assistance: Supervision Stair Management: One rail Left;With cane;Forwards;Step to pattern Number of Stairs: 12 General stair comments: Took pt in stairwell and did forward technique with cane with no LOB and did well.  Wheelchair  Mobility    Modified Rankin (Stroke Patients Only)       Balance Overall balance assessment: Needs assistance   Sitting balance-Leahy Scale: Good     Standing balance support: During functional activity Standing balance-Leahy Scale: Fair                      Cognition Arousal/Alertness: Awake/alert Behavior During Therapy: WFL for tasks assessed/performed Overall Cognitive Status: Within Functional Limits for tasks assessed                      Exercises      General Comments General comments (skin integrity, edema, etc.): Pt with good recall of WB status and stairs      Pertinent Vitals/Pain Pain Assessment: 0-10 Pain Score: 2  Pain Location: L knee Pain Descriptors / Indicators: Aching Pain Intervention(s): Monitored during session    Home Living                      Prior Function            PT Goals (current goals can now be found in the care plan section) Acute Rehab PT Goals Patient Stated Goal: to get back to being independent as soon as possible PT Goal Formulation: With patient Time For Goal Achievement: 08/19/15 Potential to Achieve Goals: Good Progress towards PT goals: Progressing toward goals    Frequency  7X/week    PT Plan Current plan remains appropriate    Co-evaluation             End of Session Equipment Utilized During Treatment:  Gait belt;Left knee immobilizer Activity Tolerance: Patient tolerated treatment well Patient left: Other (comment);with call bell/phone within reach (on toielt with his book)     Time: 6546-5035 PT Time Calculation (min) (ACUTE ONLY): 29 min  Charges:  $Gait Training: 23-37 mins                    G Codes:      Addis Bennie LUBECK 08/14/2015, 11:53 AM

## 2015-08-14 NOTE — Care Management Important Message (Signed)
Important Message  Patient Details  Name: Marc Maldonado MRN: [id number] Date of Birth: 03-08-1946   Medicare Important Message Given:  Yes-second notification given    Nathen May 08/14/2015, 12:23 PM

## 2015-08-15 LAB — WOUND CULTURE: Culture: NO GROWTH

## 2015-08-17 ENCOUNTER — Encounter (HOSPITAL_COMMUNITY): Payer: Self-pay | Admitting: Orthopedic Surgery

## 2015-08-17 LAB — ANAEROBIC CULTURE

## 2015-09-09 LAB — FUNGUS CULTURE W SMEAR: Fungal Smear: NONE SEEN

## 2015-09-21 ENCOUNTER — Telehealth: Payer: Self-pay

## 2015-09-21 NOTE — Telephone Encounter (Signed)
Patient calling with concerns about stopping IV antibiotics and removal of PICC.  He states he has not heard from our office and "wants to know what's what"  I explained to patient he is scheduled for office visit on 09-24-15, the nurse should have drawn labs today which will be her at his time of visit and based on labs and other factors Dr Tommy Medal will decide to continue or discontinue IV medications.  I explained it is ok for PICC to stay in place one day with visit the next day after IV medications have been discontinued.  If the PICC is to be removed we are able to complete this task at the time of the office visit.   Laverle Patter, RN

## 2015-09-24 ENCOUNTER — Ambulatory Visit (INDEPENDENT_AMBULATORY_CARE_PROVIDER_SITE_OTHER): Payer: Medicare Other | Admitting: Infectious Disease

## 2015-09-24 ENCOUNTER — Telehealth: Payer: Self-pay | Admitting: *Deleted

## 2015-09-24 ENCOUNTER — Encounter: Payer: Self-pay | Admitting: Infectious Disease

## 2015-09-24 VITALS — BP 154/70 | HR 60 | Temp 97.8°F | Wt 239.0 lb

## 2015-09-24 DIAGNOSIS — T8459XD Infection and inflammatory reaction due to other internal joint prosthesis, subsequent encounter: Secondary | ICD-10-CM

## 2015-09-24 DIAGNOSIS — R7881 Bacteremia: Secondary | ICD-10-CM

## 2015-09-24 DIAGNOSIS — B958 Unspecified staphylococcus as the cause of diseases classified elsewhere: Secondary | ICD-10-CM | POA: Diagnosis not present

## 2015-09-24 DIAGNOSIS — Z23 Encounter for immunization: Secondary | ICD-10-CM | POA: Diagnosis not present

## 2015-09-24 DIAGNOSIS — T8450XD Infection and inflammatory reaction due to unspecified internal joint prosthesis, subsequent encounter: Secondary | ICD-10-CM

## 2015-09-24 DIAGNOSIS — Z96659 Presence of unspecified artificial knee joint: Secondary | ICD-10-CM

## 2015-09-24 LAB — AFB CULTURE WITH SMEAR (NOT AT ARMC): Acid Fast Smear: NONE SEEN

## 2015-09-24 NOTE — Telephone Encounter (Signed)
Verbal order per Dr. Tommy Medal given to [facility] [place] to pull patient's picc line and fax over a copy of the sed rate and crp that was done on Monday 09/21/15.

## 2015-09-24 NOTE — Progress Notes (Signed)
Chief complaint: swelling of left leg  Subjective:    Patient ID: Marc Maldonado, male    DOB: 1946-09-14, 69 y.o.   MRN: [id number]  HPI  69 year old man with degenerative joint disease s/p L TKA in 2003, had aseptic loosening of tibial component with significant polyethylene wear and underwent revision 93/90/3009 complicated by infection with subsequent I&D and polyethylene exchange 10/18/2013, now s/p  Exploration of left knee. Marked excision of adhesions.Removal of all remaining components of cement including entire femoral component, tibial component, polyethylene, and patella. This was followed by extensive irrigation and debridement. Placement of a Stryker large lightly cemented spacer on both the tibia and femur. Intraperative cultures failed to grow any organisms and he was continued on high dose cefazolin 2g IV q 8 hours and completed 42 days of IV abx 2 days ago. ESR and CRP were completely normal when checked by [facility] 2 days ago.  Ever since he left the hospital he began having edema in his entire left leg which has been worked up by PCP and no DVT found. He states that Dr. Percell Miller feels that the edema is likely a reaction to the antibiotics in the spacer rather than sign of persistent infection or something else. His pain has improved since I saw him in the hospital and he is without fevers or chills.  Past Medical History  Diagnosis Date  . BPH (benign prostatic hyperplasia)   . Wears glasses   . Hypertension   . GERD (gastroesophageal reflux disease)     occas. indigestion   . Chronic kidney disease     renal stone-(2) tumors removed fr. bladder  . 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 Arthritis     low back & neck,- DDD  . DJD (degenerative joint disease)     Past Surgical History  Procedure Laterality Date  . Total knee arthroplasty  2008    rt  . Total knee arthroplasty  2003    left  . Tonsillectomy      . Carpal tunnel release  2012    rt and lt  . Hernia repair  2007    umb hr  . Cystoscopy with biopsy      bladder tumor  . Colonoscopy    . Shoulder arthroscopy w/ rotator cuff repair  2010 & 2013    bilateral   . Total knee revision Right 07/24/2013    Procedure: TOTAL KNEE REVISION;  Surgeon: [name], MD;  Location: [place];  Service: Orthopedics;  Laterality: Right;  . Total knee revision Left 10/02/2013    Procedure: TOTAL KNEE REVISION, tibial component;  Surgeon: [name], MD;  Location: [place];  Service: Orthopedics;  Laterality: Left;  . Total knee revision Left 10/18/13  . I&d knee with poly exchange Left 10/18/2013    Procedure: IRRIGATION AND DEBRIDEMENT KNEE WITH POLY EXCHANGE;  Surgeon: Renette Butters, MD;  Location: [place];  Service: Orthopedics;  Laterality: Left;  . Revision total knee arthroplasty Left 08/12/2015  . Total knee revision Left 08/12/2015    Procedure: LEFT TOTAL KNEE REVISION;  Surgeon: [name], MD;  Location: [place];  Service: Orthopedics;  Laterality: Left;    No family history on file.    Social History   Social History  . Marital Status: Married    Spouse Name: N/A  . Number of Children: N/A  .  Years of Education: N/A   Social History Main Topics  . Smoking status: Never Smoker   . Smokeless tobacco: Never Used  . Alcohol Use: No  . Drug Use: No  . Sexual Activity: Not Asked   Other Topics Concern  . None   Social History Narrative    No Known Allergies   Current outpatient prescriptions:  .  apixaban (ELIQUIS) 2.5 MG TABS tablet, Take 1 tab po Q12 hours x 6 weeks following surgery to prevent blood clots, Disp: 84 tablet, Rfl: 0 .  bisacodyl (DULCOLAX) 5 MG EC tablet, Take 1 tablet (5 mg total) by mouth daily as needed for moderate constipation., Disp: 30 tablet, Rfl: 0 .  CARTIA XT 240 MG 24 hr capsule, Take 1 capsule by mouth daily., Disp: , Rfl: 1 .  cephALEXin (KEFLEX) 500 MG capsule, Take 1 capsule (500  mg total) by mouth 2 (two) times daily., Disp: 60 capsule, Rfl: 5 .  cholecalciferol (VITAMIN D) 1000 UNITS tablet, Take 1,000 Units by mouth daily., Disp: , Rfl:  .  docusate sodium (COLACE) 100 MG capsule, Take 100 mg by mouth daily., Disp: , Rfl:  .  famotidine-calcium carbonate-magnesium hydroxide (PEPCID COMPLETE) 10-800-165 MG CHEW chewable tablet, Chew 1 tablet by mouth daily as needed (heartburn). , Disp: , Rfl:  .  ferrous sulfate 325 (65 FE) MG tablet, Take 325 mg by mouth daily with breakfast., Disp: , Rfl:  .  lisinopril-hydrochlorothiazide (PRINZIDE,ZESTORETIC) 20-12.5 MG per tablet, Take 1 tablet by mouth daily. , Disp: , Rfl:  .  methocarbamol (ROBAXIN) 500 MG tablet, Take 1 tablet (500 mg total) by mouth 4 (four) times daily., Disp: 90 tablet, Rfl: 0 .  metoprolol tartrate (LOPRESSOR) 25 MG tablet, Take 25 mg by mouth 2 (two) times daily. , Disp: , Rfl:  .  Multiple Vitamins-Minerals (MULTIVITAMIN WITH MINERALS) tablet, Take 1 tablet by mouth daily., Disp: , Rfl:  .  ondansetron (ZOFRAN) 4 MG tablet, Take 1 tablet (4 mg total) by mouth every 8 (eight) hours as needed for nausea or vomiting., Disp: 40 tablet, Rfl: 0 .  oxyCODONE-acetaminophen (ROXICET) 5-325 MG tablet, Take 1-2 tablets by mouth every 4 (four) hours as needed., Disp: 60 tablet, Rfl: 0 .  Oxymetazoline HCl (NASAL SPRAY NA), Place 1 spray into both nostrils at bedtime as needed (congestion)., Disp: , Rfl:  .  Probiotic Product (PROBIOTIC DAILY PO), Take 1 capsule by mouth daily., Disp: , Rfl:  .  tadalafil (CIALIS) 5 MG tablet, Take 2.5 mg by mouth daily after supper. , Disp: , Rfl:  .  Tamsulosin HCl (FLOMAX) 0.4 MG CAPS, Take 0.4 mg by mouth daily before breakfast. , Disp: , Rfl:  .  vitamin C (ASCORBIC ACID) 250 MG tablet, Take 250 mg by mouth daily., Disp: , Rfl:  .  zolpidem (AMBIEN) 5 MG tablet, Take 2.5 mg by mouth at bedtime as needed for sleep. , Disp: , Rfl:       Review of Systems  Constitutional:  Negative for fever, chills, diaphoresis, activity change, appetite change, fatigue and unexpected weight change.  HENT: Negative for congestion, rhinorrhea, sinus pressure, sneezing, sore throat and trouble swallowing.   Eyes: Negative for photophobia and visual disturbance.  Respiratory: Negative for cough, chest tightness, shortness of breath, wheezing and stridor.   Cardiovascular: Positive for leg swelling. Negative for chest pain and palpitations.  Gastrointestinal: Negative for nausea, vomiting, abdominal pain, diarrhea, constipation, blood in stool, abdominal distention and anal bleeding.  Genitourinary: Negative for  dysuria, hematuria, flank pain and difficulty urinating.  Musculoskeletal: Positive for joint swelling and gait problem. Negative for myalgias, back pain and arthralgias.  Skin: Positive for wound. Negative for color change, pallor and rash.  Neurological: Negative for dizziness, tremors, weakness and light-headedness.  Hematological: Negative for adenopathy. Does not bruise/bleed easily.  Psychiatric/Behavioral: Negative for behavioral problems, confusion, sleep disturbance, dysphoric mood, decreased concentration and agitation.       Objective:   Physical Exam  Constitutional: He is oriented to person, place, and time. He appears well-developed and well-nourished.  HENT:  Head: Normocephalic and atraumatic.  Eyes: Conjunctivae and EOM are normal.  Neck: Normal range of motion. Neck supple.  Cardiovascular: Normal rate and regular rhythm.   Pulmonary/Chest: Effort normal. No respiratory distress. He has no wheezes.  Abdominal: Soft. He exhibits no distension.  Musculoskeletal: Normal range of motion. He exhibits edema. He exhibits no tenderness.  Neurological: He is alert and oriented to person, place, and time.  Skin: Skin is warm and dry. No rash noted. No erythema.  Psychiatric: He has a normal mood and affect. His behavior is normal. Judgment and thought content  normal.  Nursing note and vitals reviewed.  Left knee site surgical scar clean. Left knee is warmer than right and left leg has significantly more edema than the right  09/24/15:            Assessment & Plan:   #1 Recurrent MSSA infection of THA now sp removal of all hardware and sp 42 days of cefazolin  Inflammatory markers are normal and pain is better  Only issue is his edema which Dr. Percell Miller believes is due to reaction to abx spacer  He is to see Dr. Percell Miller tomorrow  I would recommend consideration of waiting for 2-4 weeks and then if Dr. Percell Miller aspirated the knee joint and did NOT find significant WBC and did not grow any organisms from culture of aspirate should be very reassuring that it is safe for reimplantation.  #2 Edema: see above discussion  I spent greater than 25 minutes with the patient including greater than 50% of time in face to face counsel of the patient and his wife re his persistent, recurrent MSSA infection of prosthetic knee and his lower extremity edema and in coordination of their care.

## 2015-09-28 ENCOUNTER — Inpatient Hospital Stay: Payer: Medicare Other | Admitting: Infectious Disease

## 2015-10-27 ENCOUNTER — Ambulatory Visit (INDEPENDENT_AMBULATORY_CARE_PROVIDER_SITE_OTHER): Payer: Medicare Other | Admitting: Infectious Disease

## 2015-10-27 ENCOUNTER — Encounter: Payer: Self-pay | Admitting: Infectious Disease

## 2015-10-27 VITALS — BP 137/70 | HR 60 | Temp 98.0°F | Wt 236.0 lb

## 2015-10-27 DIAGNOSIS — B958 Unspecified staphylococcus as the cause of diseases classified elsewhere: Secondary | ICD-10-CM | POA: Diagnosis not present

## 2015-10-27 DIAGNOSIS — R7881 Bacteremia: Secondary | ICD-10-CM | POA: Diagnosis not present

## 2015-10-27 DIAGNOSIS — R600 Localized edema: Secondary | ICD-10-CM | POA: Insufficient documentation

## 2015-10-27 DIAGNOSIS — T8450XD Infection and inflammatory reaction due to unspecified internal joint prosthesis, subsequent encounter: Secondary | ICD-10-CM | POA: Diagnosis not present

## 2015-10-27 DIAGNOSIS — T8459XD Infection and inflammatory reaction due to other internal joint prosthesis, subsequent encounter: Secondary | ICD-10-CM

## 2015-10-27 DIAGNOSIS — Z96659 Presence of unspecified artificial knee joint: Secondary | ICD-10-CM

## 2015-10-27 HISTORY — DX: Localized edema: R60.0

## 2015-10-27 LAB — BASIC METABOLIC PANEL WITH GFR
BUN: 14 mg/dL (ref 7–25)
CALCIUM: 9.2 mg/dL (ref 8.6–10.3)
CO2: 31 mmol/L (ref 20–31)
CREATININE: 1.11 mg/dL (ref 0.70–1.25)
Chloride: 104 mmol/L (ref 98–110)
GFR, Est African American: 78 mL/min (ref >=60)
GFR, Est Non African American: 67 mL/min (ref >=60)
Glucose, Bld: 107 mg/dL — ABNORMAL HIGH (ref 65–99)
Potassium: 3.9 mmol/L (ref 3.5–5.3)
SODIUM: 144 mmol/L (ref 135–146)

## 2015-10-27 LAB — C-REACTIVE PROTEIN: CRP: <0.5 mg/dL (ref <0.60)

## 2015-10-27 NOTE — Progress Notes (Signed)
Chief complaint: swelling of left leg  Subjective:    Patient ID: Marc Maldonado, male    DOB: 1946-01-26, 70 y.o.   MRN: [id number]  HPI   70 year old man with degenerative joint disease s/p L TKA in 2003, had aseptic loosening of tibial component with significant polyethylene wear and underwent revision 09/73/5329 complicated by infection with subsequent I&D and polyethylene exchange 10/18/2013, now s/p  Exploration of left knee. Marked excision of adhesions.Removal of all remaining components of cement including entire femoral component, tibial component, polyethylene, and patella. This was followed by extensive irrigation and debridement. Placement of a Stryker large lightly cemented spacer on both the tibia and femur. Intraperative cultures failed to grow any organisms and he was continued on high dose cefazolin 2g IV q 8 hours and completed 42 days of IV abx 2 days ago. ESR and CRP were completely normal when checked by [facility] 2 days prior to last admission  Ever since he left the hospital he began having edema in his entire left leg which has been worked up by PCP and no DVT found but upon discussion today no DOPPLER done so will check this for him  His pain has improved since he was in the hospital and he is without fevers or chills.  Pain is typically at 1-2/10. His leg swelling has not improved. Pain in leg worse when he is getting out of certain positions.   He is to have aspirate from his knee by Dr. Percell Miller he believes in a few weeks.     Past Medical History  Diagnosis Date  . BPH (benign prostatic hyperplasia)   . Wears glasses   . Hypertension   . GERD (gastroesophageal reflux disease)     occas. indigestion   . Chronic kidney disease     renal stone-(2) tumors removed fr. bladder  . Cancer ([place])     small section of bowel removed that was malignant - no further treatment   . Arthritis     low back & neck,- DDD  . DJD (degenerative joint disease)     Past  Surgical History  Procedure Laterality Date  . Total knee arthroplasty  2008    rt  . Total knee arthroplasty  2003    left  . Tonsillectomy    . Carpal tunnel release  2012    rt and lt  . Hernia repair  2007    umb hr  . Cystoscopy with biopsy      bladder tumor  . Colonoscopy    . Shoulder arthroscopy w/ rotator cuff repair  2010 & 2013    bilateral   . Total knee revision Right 07/24/2013    Procedure: TOTAL KNEE REVISION;  Surgeon: [name], MD;  Location: [place];  Service: Orthopedics;  Laterality: Right;  . Total knee revision Left 10/02/2013    Procedure: TOTAL KNEE REVISION, tibial component;  Surgeon: [name], MD;  Location: [place];  Service: Orthopedics;  Laterality: Left;  . Total knee revision Left 10/18/13  . I&d knee with poly exchange Left 10/18/2013    Procedure: IRRIGATION AND DEBRIDEMENT KNEE WITH POLY EXCHANGE;  Surgeon: Renette Butters, MD;  Location: [place];  Service: Orthopedics;  Laterality: Left;  . Revision total knee arthroplasty Left 08/12/2015  . Total knee revision Left 08/12/2015    Procedure: LEFT TOTAL KNEE REVISION;  Surgeon: [name], MD;  Location: [place];  Service: Orthopedics;  Laterality: Left;  No family history on file.    Social History   Social History  . Marital Status: Married    Spouse Name: N/A  . Number of Children: N/A  . Years of Education: N/A   Social History Main Topics  . Smoking status: Never Smoker   . Smokeless tobacco: Never Used  . Alcohol Use: No  . Drug Use: No  . Sexual Activity: Not Asked   Other Topics Concern  . None   Social History Narrative    No Known Allergies   Current outpatient prescriptions:  .  bisacodyl (DULCOLAX) 5 MG EC tablet, Take 1 tablet (5 mg total) by mouth daily as needed for moderate constipation., Disp: 30 tablet, Rfl: 0 .  cholecalciferol (VITAMIN D) 1000 UNITS tablet, Take 1,000 Units by mouth daily., Disp: , Rfl:  .  docusate sodium (COLACE) 100 MG  capsule, Take 100 mg by mouth daily., Disp: , Rfl:  .  famotidine-calcium carbonate-magnesium hydroxide (PEPCID COMPLETE) 10-800-165 MG CHEW chewable tablet, Chew 1 tablet by mouth daily as needed (heartburn). , Disp: , Rfl:  .  lisinopril-hydrochlorothiazide (PRINZIDE,ZESTORETIC) 20-12.5 MG per tablet, Take 1 tablet by mouth daily. , Disp: , Rfl:  .  metoprolol tartrate (LOPRESSOR) 25 MG tablet, Take 25 mg by mouth 2 (two) times daily. , Disp: , Rfl:  .  Multiple Vitamins-Minerals (MULTIVITAMIN WITH MINERALS) tablet, Take 1 tablet by mouth daily., Disp: , Rfl:  .  oxyCODONE-acetaminophen (ROXICET) 5-325 MG tablet, Take 1-2 tablets by mouth every 4 (four) hours as needed., Disp: 60 tablet, Rfl: 0 .  Oxymetazoline HCl (NASAL SPRAY NA), Place 1 spray into both nostrils at bedtime as needed (congestion)., Disp: , Rfl:  .  Probiotic Product (PROBIOTIC DAILY PO), Take 1 capsule by mouth daily., Disp: , Rfl:  .  Tamsulosin HCl (FLOMAX) 0.4 MG CAPS, Take 0.4 mg by mouth daily before breakfast. , Disp: , Rfl:  .  vitamin C (ASCORBIC ACID) 250 MG tablet, Take 250 mg by mouth daily., Disp: , Rfl:  .  zolpidem (AMBIEN) 5 MG tablet, Take 2.5 mg by mouth at bedtime as needed for sleep. , Disp: , Rfl:  .  tadalafil (CIALIS) 5 MG tablet, Take 2.5 mg by mouth daily after supper. , Disp: , Rfl:       Review of Systems  Constitutional: Negative for fever, chills, diaphoresis, activity change, appetite change, fatigue and unexpected weight change.  HENT: Negative for congestion, rhinorrhea, sinus pressure, sneezing, sore throat and trouble swallowing.   Eyes: Negative for photophobia and visual disturbance.  Respiratory: Negative for cough, chest tightness, shortness of breath, wheezing and stridor.   Cardiovascular: Positive for leg swelling. Negative for chest pain and palpitations.  Gastrointestinal: Negative for nausea, vomiting, abdominal pain, diarrhea, constipation, blood in stool, abdominal distention  and anal bleeding.  Genitourinary: Negative for dysuria, hematuria, flank pain and difficulty urinating.  Musculoskeletal: Positive for joint swelling and gait problem. Negative for myalgias, back pain and arthralgias.  Skin: Positive for wound. Negative for color change, pallor and rash.  Neurological: Negative for dizziness, tremors, weakness and light-headedness.  Hematological: Negative for adenopathy. Does not bruise/bleed easily.  Psychiatric/Behavioral: Negative for behavioral problems, confusion, sleep disturbance, dysphoric mood, decreased concentration and agitation.       Objective:   Physical Exam  Constitutional: He is oriented to person, place, and time. He appears well-developed and well-nourished.  HENT:  Head: Normocephalic and atraumatic.  Eyes: Conjunctivae and EOM are normal.  Neck: Normal range of  motion. Neck supple.  Cardiovascular: Normal rate and regular rhythm.   Pulmonary/Chest: Effort normal. No respiratory distress. He has no wheezes.  Abdominal: Soft. He exhibits no distension.  Musculoskeletal: Normal range of motion. He exhibits edema. He exhibits no tenderness.  Neurological: He is alert and oriented to person, place, and time.  Skin: Skin is warm and dry. No rash noted. No erythema.  Psychiatric: He has a normal mood and affect. His behavior is normal. Judgment and thought content normal.  Nursing note and vitals reviewed.  Left knee site surgical scar clean. Left knee is warmer than right and left leg has significantly more edema than the right  09/24/15:     10/27/15:           Assessment & Plan:   #1 Recurrent MSSA infection of THA now sp removal of all hardware and sp 42 days of cefazolin, having been off all abx now for more than a month  Inflammatory markers are normal and pain is stable.  Recheck above again today  Only issue is his edema which Dr. Percell Miller believes is due to reaction to abx spacer  He is to see Dr. Percell Miller for  aspirate--we believe   #2 Edema: see above discussion and will get doppler ordered  I spent greater than 25 minutes with the patient including greater than 50% of time in face to face counsel of the patient and his wife re his persistent, recurrent MSSA infection of prosthetic knee and his lower extremity edema and in coordination of their care.

## 2015-10-28 ENCOUNTER — Encounter: Payer: Self-pay | Admitting: Infectious Disease

## 2015-10-28 ENCOUNTER — Ambulatory Visit (HOSPITAL_COMMUNITY)
Admission: RE | Admit: 2015-10-28 | Discharge: 2015-10-28 | Disposition: A | Discharge status: Home / Self Care | Payer: Medicare Other | Source: Ambulatory Visit | Attending: Infectious Disease | Admitting: Infectious Disease

## 2015-10-28 ENCOUNTER — Telehealth: Payer: Self-pay | Admitting: *Deleted

## 2015-10-28 DIAGNOSIS — Z96659 Presence of unspecified artificial knee joint: Secondary | ICD-10-CM

## 2015-10-28 DIAGNOSIS — M7989 Other specified soft tissue disorders: Secondary | ICD-10-CM | POA: Insufficient documentation

## 2015-10-28 DIAGNOSIS — T8459XD Infection and inflammatory reaction due to other internal joint prosthesis, subsequent encounter: Secondary | ICD-10-CM

## 2015-10-28 DIAGNOSIS — Y839 Surgical procedure, unspecified as the cause of abnormal reaction of the patient, or of later complication, without mention of misadventure at the time of the procedure: Secondary | ICD-10-CM | POA: Insufficient documentation

## 2015-10-28 DIAGNOSIS — T8450XD Infection and inflammatory reaction due to unspecified internal joint prosthesis, subsequent encounter: Secondary | ICD-10-CM | POA: Insufficient documentation

## 2015-10-28 LAB — SEDIMENTATION RATE: Sed Rate: 4 mm/hr (ref 0–20)

## 2015-10-28 NOTE — Progress Notes (Signed)
*  Preliminary Results* Left lower extremity venous duplex completed. Left lower extremity is negative for deep vein thrombosis. There is no evidence of left Baker's cyst.  Preliminary results discussed with Darnelle Maffucci of Dr. Derek Mound office.  10/28/2015 10:46 AM  Maudry Mayhew, RVT, RDCS, RDMS

## 2015-10-28 NOTE — Telephone Encounter (Signed)
Excellent

## 2015-10-28 NOTE — Telephone Encounter (Signed)
Vascular lab called to give report on the patient and advised he was negative for DVT. Advised sending patient home. Advised will let the patient know the results.

## 2015-11-10 ENCOUNTER — Other Ambulatory Visit: Payer: Self-pay | Admitting: Physician Assistant

## 2015-11-10 NOTE — H&P (Signed)
TOTAL KNEE REVISION ADMISSION H&P  Patient is being admitted for left revision total knee arthroplasty.  Subjective:  Chief Complaint:left knee pain.  HPI: Marc Maldonado, 70 y.o. male, has a history of pain and functional disability in the left knee(s) due to infection and patient has failed non-surgical conservative treatments for greater than 12 weeks to include NSAID's and/or analgesics and use of assistive devices. The indications for the revision of the total knee arthroplasty are history of total knee infection. Onset of symptoms was gradual starting 2 years ago with gradually worsening course since that time.  Prior procedures on the left knee(s) include arthroplasty.  Patient currently rates pain in the left knee(s) at 5 out of 10 with activity. There is night pain, worsening of pain with activity and weight bearing, pain that interferes with activities of daily living and pain with passive range of motion.  Patient has evidence of cement spacer by imaging studies. This condition presents safety issues increasing the risk of falls. This patient has had post-op infection.  There is no current active infection.  Patient Active Problem List   Diagnosis Date Noted  . Edema of left lower extremity 10/27/2015  . S/P revision of total knee   . Prosthetic joint infection ([place])   . 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 DVT of axillary vein, acute right 02/26/2014  . Postoperative anemia due to acute blood loss 10/20/2013  . Normocytic anemia 10/18/2013  . Acute renal insufficiency 10/18/2013  . Hyperglycemia 10/18/2013  . Bacteremia due to Staphylococcus 10/18/2013  . 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 DJD (degenerative joint disease) of knee 10/02/2013  . Essential hypertension, benign 09/23/2013  . GERD (gastroesophageal reflux disease) 09/23/2013  . 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 BPH (benign prostatic hyperplasia)   . Wears glasses   . Hypertension   . GERD (gastroesophageal reflux disease)     occas. indigestion   . Chronic kidney disease     renal stone-(2) tumors removed fr. bladder  . Cancer ([place])     small section of bowel removed that was malignant - no further treatment   . Arthritis     low back & neck,- DDD  . DJD (degenerative joint disease)   . Edema of left lower extremity 10/27/2015    Past Surgical History  Procedure Laterality Date  . Total knee arthroplasty  2008    rt  . Total knee arthroplasty  2003    left  . Tonsillectomy    . Carpal tunnel release  2012    rt and lt  . Hernia repair  2007    umb hr  . Cystoscopy with biopsy      bladder tumor  . Colonoscopy    . Shoulder arthroscopy w/ rotator cuff repair  2010 & 2013    bilateral   . Total knee revision Right 07/24/2013    Procedure: TOTAL KNEE REVISION;  Surgeon: [name], MD;  Location: [facility];  Service: Orthopedics;  Laterality: Right;  . Total knee revision Left 10/02/2013    Procedure: TOTAL KNEE REVISION, tibial component;  Surgeon: [name], MD;  Location: [facility];  Service: Orthopedics;  Laterality: Left;  . Total knee revision Left 10/18/13  . I&d knee with poly exchange Left 10/18/2013    Procedure: IRRIGATION AND DEBRIDEMENT KNEE WITH POLY EXCHANGE;  Surgeon: Christia Reading  Maryla Morrow, MD;  Location: [facility];  Service: Orthopedics;  Laterality: Left;  . Revision total knee arthroplasty Left 08/12/2015  . Total knee revision Left 08/12/2015    Procedure: LEFT TOTAL KNEE REVISION;  Surgeon: [name], MD;  Location: [facility];  Service: Orthopedics;  Laterality: Left;     (Not in a hospital admission) No Known Allergies  Social History  Substance Use Topics  . Smoking status: Never Smoker   . Smokeless tobacco: Never Used  . Alcohol Use: No    No family history on file.    Review of Systems  Constitutional: Negative.   HENT: Negative.    Eyes: Negative.   Respiratory: Negative.   Cardiovascular: Negative.   Gastrointestinal: Negative.   Genitourinary: Negative.   Musculoskeletal: Positive for joint pain.  Skin: Negative.   Neurological: Negative.   Endo/Heme/Allergies: Bruises/bleeds easily.  Psychiatric/Behavioral: Negative.      Objective:  Physical Exam  Constitutional: He is oriented to person, place, and time. He appears well-developed and well-nourished.  HENT:  Head: Normocephalic and atraumatic.  Eyes: EOM are normal. Pupils are equal, round, and reactive to light.  Neck: Normal range of motion. Neck supple.  Cardiovascular: Normal rate and regular rhythm.   Respiratory: Effort normal and breath sounds normal.  GI: Soft. Bowel sounds are normal.  Musculoskeletal:  Specific exam reveals really no warmth.  Limited motion of his knee as expected.  He does have a 2-3+ effusion, but this is non-tense and does not feel infectious.  Some distal edema because of the knee swelling.    Neurological: He is alert and oriented to person, place, and time.  Skin: Skin is warm and dry.  Psychiatric: He has a normal mood and affect. His behavior is normal. Judgment and thought content normal.    Vital signs in last 24 hours: @VSRANGES @  Labs:  Estimated body mass index is 33.86 kg/(m^2) as calculated from the following:   Height as of 08/12/15: 5\' 10"  (1.778 m).   Weight as of 10/27/15: 107.049 kg (236 lb).  Imaging Review Plain radiographs demonstrate cement spacer of the left knee(s). The overall alignment is neutral. The bone quality appears to be fair for age and reported activity level.  Assessment/Plan:  End stage arthritis, left knee(s) with failed previous arthroplasty.   The patient history, physical examination, clinical judgment of the provider and imaging studies are consistent with end stage degenerative joint disease of the left knee(s), previous total knee arthroplasty. Revision total knee  arthroplasty is deemed medically necessary. The treatment options including medical management, injection therapy, arthroscopy and revision arthroplasty were discussed at length. The risks and benefits of revision total knee arthroplasty were presented and reviewed. The risks due to aseptic loosening, infection, stiffness, patella tracking problems, thromboembolic complications and other imponderables were discussed. The patient acknowledged the explanation, agreed to proceed with the plan and consent was signed. Patient is being admitted for inpatient treatment for surgery, pain control, PT, OT, prophylactic antibiotics, VTE prophylaxis, progressive ambulation and ADL's and discharge planning.The patient is planning to be discharged home with home health services

## 2015-11-12 NOTE — Pre-Procedure Instructions (Signed)
    Marc Maldonado  11/12/2015      [id number] [address], [place] - [address] [place] [address] Phone: [phone] Fax: [phone]  [id number] - [place], [address] [place] [address] Phone: [phone] Fax: [phone]  [facility] [address], [place] - [address] [place] [address] Phone: [phone] Fax: [phone]    Your procedure is scheduled on Wednesday, February 1.  Report to [facility] Admitting at 11:30 A.M.                Your surgery or procedure is scheduled for 9:30 AM   Call this number if you have problems the morning of surgery: [phone]                For any other questions, please call [phone], Monday - Friday 8 AM - 4 PM.   Remember:  Do not eat food or drink liquids after midnight Tuesday, January 31.  Take these medicines the morning of surgery with A SIP OF WATER:  diltiazem (CARDIZEM CD), metoprolol tartrate (LOPRESSOR), Tamsulosin HCl (FLOMAX).                On Wednesday, January 25, STOP taking Vitamins, COQ10, GLUCOSAMINE.             Do not wear jewelry, make-up or nail polish.  Do not wear lotions, powders, or perfumes.     Men may shave face and neck.  Do not bring valuables to the hospital.  [facility] is not responsible for any belongings or valuables.   Contacts, dentures or bridgework may not be worn into surgery.  Leave your suitcase in the car.  After surgery it may be brought to your room.  For patients admitted to the hospital, discharge time will be determined by your treatment team.  Patients discharged the day of surgery will not be allowed to drive home.   Name and phone number of your driver:   -  Special instructions: -  Please read over the following fact sheets that you were given. Pain Booklet, Coughing and Deep Breathing, Blood  Transfusion Information and Surgical Site Infection Prevention

## 2015-11-13 ENCOUNTER — Encounter (HOSPITAL_COMMUNITY)
Admission: RE | Admit: 2015-11-13 | Discharge: 2015-11-13 | Disposition: A | Discharge status: Home / Self Care | Payer: Medicare Other | Source: Ambulatory Visit | Attending: Orthopedic Surgery | Admitting: Orthopedic Surgery

## 2015-11-13 ENCOUNTER — Encounter (HOSPITAL_COMMUNITY): Payer: Self-pay

## 2015-11-13 DIAGNOSIS — E669 Obesity, unspecified: Secondary | ICD-10-CM | POA: Insufficient documentation

## 2015-11-13 DIAGNOSIS — M1990 Unspecified osteoarthritis, unspecified site: Secondary | ICD-10-CM | POA: Insufficient documentation

## 2015-11-13 DIAGNOSIS — Z0183 Encounter for blood typing: Secondary | ICD-10-CM | POA: Diagnosis not present

## 2015-11-13 DIAGNOSIS — Z01812 Encounter for preprocedural laboratory examination: Secondary | ICD-10-CM | POA: Diagnosis not present

## 2015-11-13 DIAGNOSIS — Z85038 Personal history of other malignant neoplasm of large intestine: Secondary | ICD-10-CM | POA: Diagnosis not present

## 2015-11-13 DIAGNOSIS — Z79899 Other long term (current) drug therapy: Secondary | ICD-10-CM | POA: Diagnosis not present

## 2015-11-13 DIAGNOSIS — Z6835 Body mass index (BMI) 35.0-35.9, adult: Secondary | ICD-10-CM | POA: Insufficient documentation

## 2015-11-13 DIAGNOSIS — N189 Chronic kidney disease, unspecified: Secondary | ICD-10-CM | POA: Insufficient documentation

## 2015-11-13 DIAGNOSIS — I129 Hypertensive chronic kidney disease with stage 1 through stage 4 chronic kidney disease, or unspecified chronic kidney disease: Secondary | ICD-10-CM | POA: Insufficient documentation

## 2015-11-13 DIAGNOSIS — Z01818 Encounter for other preprocedural examination: Secondary | ICD-10-CM | POA: Diagnosis present

## 2015-11-13 HISTORY — DX: Anemia, unspecified: D64.9

## 2015-11-13 HISTORY — DX: Acute embolism and thrombosis of unspecified vein: I82.90

## 2015-11-13 LAB — CBC WITH DIFFERENTIAL/PLATELET
Basophils Absolute: 0 10*3/uL (ref 0.0–0.1)
Basophils Relative: 1 %
EOS PCT: 3 %
Eosinophils Absolute: 0.1 10*3/uL (ref 0.0–0.7)
HCT: 38.8 % — ABNORMAL LOW (ref 39.0–52.0)
Hemoglobin: 13.3 g/dL (ref 13.0–17.0)
LYMPHS ABS: 1.2 10*3/uL (ref 0.7–4.0)
LYMPHS PCT: 24 %
MCH: 32.3 pg (ref 26.0–34.0)
MCHC: 34.3 g/dL (ref 30.0–36.0)
MCV: 94.2 fL (ref 78.0–100.0)
MONO ABS: 0.3 10*3/uL (ref 0.1–1.0)
Monocytes Relative: 6 %
Neutro Abs: 3.4 10*3/uL (ref 1.7–7.7)
Neutrophils Relative %: 66 %
PLATELETS: 137 10*3/uL — AB (ref 150–400)
RBC: 4.12 MIL/uL — ABNORMAL LOW (ref 4.22–5.81)
RDW: 13.1 % (ref 11.5–15.5)
WBC: 5.1 10*3/uL (ref 4.0–10.5)

## 2015-11-13 LAB — COMPREHENSIVE METABOLIC PANEL
ALT: 13 U/L — ABNORMAL LOW (ref 17–63)
AST: 20 U/L (ref 15–41)
Albumin: 3.9 g/dL (ref 3.5–5.0)
Alkaline Phosphatase: 67 U/L (ref 38–126)
Anion gap: 8 (ref 5–15)
BUN: 14 mg/dL (ref 6–20)
CHLORIDE: 106 mmol/L (ref 101–111)
CO2: 30 mmol/L (ref 22–32)
CREATININE: 1.17 mg/dL (ref 0.61–1.24)
Calcium: 9.6 mg/dL (ref 8.9–10.3)
Glucose, Bld: 95 mg/dL (ref 65–99)
POTASSIUM: 3.8 mmol/L (ref 3.5–5.1)
Sodium: 144 mmol/L (ref 135–145)
Total Bilirubin: 0.7 mg/dL (ref 0.3–1.2)
Total Protein: 5.9 g/dL — ABNORMAL LOW (ref 6.5–8.1)

## 2015-11-13 LAB — PROTIME-INR
INR: 1.25 (ref 0.00–1.49)
PROTHROMBIN TIME: 15.8 s — AB (ref 11.6–15.2)

## 2015-11-13 LAB — SURGICAL PCR SCREEN
MRSA, PCR: NEGATIVE
Staphylococcus aureus: NEGATIVE

## 2015-11-13 LAB — APTT: aPTT: 29 seconds (ref 24–37)

## 2015-11-13 NOTE — Progress Notes (Signed)
PCP is Dr. Suzanna Obey Denies ever seeing a cardiologist. Denies ever having a stress test, or card cath Echo noted from 10-21-2013

## 2015-11-14 LAB — URINE CULTURE: Culture: NO GROWTH

## 2015-11-16 NOTE — Progress Notes (Signed)
Anesthesia Chart Review: Patient is a 70 year old male scheduled for left TK revision on 11/25/15 08/12/15 by Dr. Kathryne Hitch. Last revision was on 08/12/15 for chronic infection TKR with marked excision of adhesions, removal of all remaining components, I&D and placement of a [employer]ryker large spacer on both the tibia and femur.  History includes non-smoker, obesity (BMI 35), BPH, HTN, GERD, CKD renal stones/tumors removed), arthritis, DJD, colon cancer s/p resection (stage 1?), left TKR '03 s/p revision 10/02/13 and I&D 10/18/13 and 08/12/15, right TKR '08 with revision 07/24/13, right rotator cuff repair '10, left rotator cuff repair '13, bilateral CTR '13. 2015 ID notes indicate a history of RUE clot from his PICC line (discontinued 11/28/13).   PCP is Dr. Suzanna Obey in [place].  He is being followed by [facility]. Last visit 10/27/15 with Dr. Tommy Medal for follow-up recurrent MSSA infection of left THA now s/p removal of hardware and s/p 42 days of cefazolin. He ordered a LLE venous duplex which was negative for DVT on 10/28/15.  Meds include clindamycin, Cardizem CD, Pepcid complete, oxycodone, lisinopril, lisinopril-HCTZ, Lopressor, Cialis, Flomax, Ambien.  07/31/15 EKG: SB at 54 bpm, first degree AVB, LAD. No significant change since last tracing on 10/17/13.  10/21/13 Echo: Study Conclusions - Left ventricle: The cavity size was normal. Systolic function was normal. The estimated ejection fraction was in the range of 60% to 65%. Wall motion was normal; there were no regional wall motion abnormalities. - Aortic valve: Trivial regurgitation. - Mitral valve: Calcified annulus. - Atrial septum: There was an atrial septal aneurysm.  Preoperative labs noted. Urine culture showed no growth.  If no acute changes then I would anticipate that he could proceed as planned from an anesthesia standpoint.   George Hugh [facility]/Anesthesiology Phone [phone] 11/16/2015  5:59 PM

## 2015-11-24 MED ORDER — CHLORHEXIDINE GLUCONATE 4 % EX LIQD: 60.0000 mL | Freq: Once | CUTANEOUS | Status: DC

## 2015-11-24 MED ORDER — VANCOMYCIN HCL IN DEXTROSE 1-5 GM/200ML-% IV SOLN
1000.0000 mg | INTRAVENOUS | Status: AC
  Administered 2015-11-25: 1000 mg via INTRAVENOUS
  Filled 2015-11-24: qty 200

## 2015-11-24 MED ORDER — LACTATED RINGERS IV SOLN
INTRAVENOUS | Status: DC
  Administered 2015-11-25 (×4): via INTRAVENOUS

## 2015-11-25 ENCOUNTER — Inpatient Hospital Stay (HOSPITAL_COMMUNITY): Payer: Medicare Other | Admitting: Anesthesiology

## 2015-11-25 ENCOUNTER — Encounter (HOSPITAL_COMMUNITY)
Admission: RE | Disposition: A | Discharge status: Home / Self Care | Payer: Self-pay | Source: Ambulatory Visit | Attending: Orthopedic Surgery

## 2015-11-25 ENCOUNTER — Inpatient Hospital Stay (HOSPITAL_COMMUNITY): Payer: Medicare Other

## 2015-11-25 ENCOUNTER — Encounter (HOSPITAL_COMMUNITY): Payer: Self-pay | Admitting: Certified Registered Nurse Anesthetist

## 2015-11-25 ENCOUNTER — Inpatient Hospital Stay (HOSPITAL_COMMUNITY)
Admission: RE | Admit: 2015-11-25 | Discharge: 2015-11-28 | DRG: 467 | Disposition: A | Discharge status: Home / Self Care | Payer: Medicare Other | Source: Ambulatory Visit | Attending: Orthopedic Surgery | Admitting: Orthopedic Surgery

## 2015-11-25 ENCOUNTER — Inpatient Hospital Stay (HOSPITAL_COMMUNITY): Payer: Medicare Other | Admitting: Vascular Surgery

## 2015-11-25 DIAGNOSIS — Z96659 Presence of unspecified artificial knee joint: Secondary | ICD-10-CM

## 2015-11-25 DIAGNOSIS — D62 Acute posthemorrhagic anemia: Secondary | ICD-10-CM | POA: Diagnosis not present

## 2015-11-25 DIAGNOSIS — Z96652 Presence of left artificial knee joint: Secondary | ICD-10-CM | POA: Diagnosis present

## 2015-11-25 DIAGNOSIS — N189 Chronic kidney disease, unspecified: Secondary | ICD-10-CM | POA: Diagnosis present

## 2015-11-25 DIAGNOSIS — K219 Gastro-esophageal reflux disease without esophagitis: Secondary | ICD-10-CM | POA: Diagnosis present

## 2015-11-25 DIAGNOSIS — T8454XA Infection and inflammatory reaction due to internal left knee prosthesis, initial encounter: Principal | ICD-10-CM | POA: Diagnosis present

## 2015-11-25 DIAGNOSIS — I10 Essential (primary) hypertension: Secondary | ICD-10-CM | POA: Diagnosis present

## 2015-11-25 DIAGNOSIS — I129 Hypertensive chronic kidney disease with stage 1 through stage 4 chronic kidney disease, or unspecified chronic kidney disease: Secondary | ICD-10-CM | POA: Diagnosis present

## 2015-11-25 DIAGNOSIS — M25562 Pain in left knee: Secondary | ICD-10-CM | POA: Diagnosis present

## 2015-11-25 HISTORY — PX: TOTAL KNEE REVISION: SHX996

## 2015-11-25 SURGERY — TOTAL KNEE REVISION
Anesthesia: General | Site: Knee | Laterality: Left

## 2015-11-25 MED ORDER — ONDANSETRON HCL 4 MG PO TABS: 4.0000 mg | ORAL_TABLET | Freq: Three times a day (TID) | ORAL | Status: AC | PRN

## 2015-11-25 MED ORDER — SUCCINYLCHOLINE CHLORIDE 20 MG/ML IJ SOLN
INTRAMUSCULAR | Status: DC | PRN
  Administered 2015-11-25: 120 mg via INTRAVENOUS

## 2015-11-25 MED ORDER — BISACODYL 10 MG RE SUPP: 10.0000 mg | Freq: Every day | RECTAL | Status: DC | PRN

## 2015-11-25 MED ORDER — METHOCARBAMOL 500 MG PO TABS: 500.0000 mg | ORAL_TABLET | Freq: Four times a day (QID) | ORAL | Status: AC

## 2015-11-25 MED ORDER — PROPOFOL 10 MG/ML IV BOLUS
INTRAVENOUS | Status: DC | PRN
  Administered 2015-11-25: 30 mg via INTRAVENOUS
  Administered 2015-11-25: 200 mg via INTRAVENOUS
  Administered 2015-11-25: 20 mg via INTRAVENOUS
  Administered 2015-11-25: 50 mg via INTRAVENOUS
  Administered 2015-11-25: 30 mg via INTRAVENOUS

## 2015-11-25 MED ORDER — MIDAZOLAM HCL 2 MG/2ML IJ SOLN
INTRAMUSCULAR | Status: AC
  Filled 2015-11-25: qty 2

## 2015-11-25 MED ORDER — ARTIFICIAL TEARS OP OINT
TOPICAL_OINTMENT | OPHTHALMIC | Status: AC
  Filled 2015-11-25: qty 3.5

## 2015-11-25 MED ORDER — HYDROMORPHONE HCL 1 MG/ML IJ SOLN
0.2500 mg | INTRAMUSCULAR | Status: DC | PRN
  Administered 2015-11-25 (×4): 0.5 mg via INTRAVENOUS

## 2015-11-25 MED ORDER — OXYCODONE-ACETAMINOPHEN 5-325 MG PO TABS: 1.0000 | ORAL_TABLET | ORAL | Status: AC | PRN

## 2015-11-25 MED ORDER — COQ-10 200 MG PO CAPS: 1.0000 | ORAL_CAPSULE | Freq: Every day | ORAL | Status: DC

## 2015-11-25 MED ORDER — HYDROMORPHONE HCL 1 MG/ML IJ SOLN
INTRAMUSCULAR | Status: AC
  Filled 2015-11-25: qty 1

## 2015-11-25 MED ORDER — HYDROMORPHONE HCL 1 MG/ML IJ SOLN
0.5000 mg | INTRAMUSCULAR | Status: DC | PRN
  Administered 2015-11-25 (×3): 1 mg via INTRAVENOUS
  Filled 2015-11-25 (×2): qty 1

## 2015-11-25 MED ORDER — TADALAFIL 5 MG PO TABS: 2.5000 mg | ORAL_TABLET | Freq: Every day | ORAL | Status: DC

## 2015-11-25 MED ORDER — OXYCODONE HCL ER 10 MG PO T12A
20.0000 mg | EXTENDED_RELEASE_TABLET | Freq: Two times a day (BID) | ORAL | Status: DC
  Administered 2015-11-25: 20 mg via ORAL
  Filled 2015-11-25: qty 2

## 2015-11-25 MED ORDER — MIDAZOLAM HCL 2 MG/2ML IJ SOLN
INTRAMUSCULAR | Status: DC | PRN
  Administered 2015-11-25 (×2): 1 mg via INTRAVENOUS

## 2015-11-25 MED ORDER — CLINDAMYCIN HCL 150 MG PO CAPS
150.0000 mg | ORAL_CAPSULE | Freq: Four times a day (QID) | ORAL | Status: DC
  Administered 2015-11-25 – 2015-11-28 (×10): 150 mg via ORAL
  Filled 2015-11-25 (×15): qty 1

## 2015-11-25 MED ORDER — PHENYLEPHRINE 40 MCG/ML (10ML) SYRINGE FOR IV PUSH (FOR BLOOD PRESSURE SUPPORT)
PREFILLED_SYRINGE | INTRAVENOUS | Status: AC
  Filled 2015-11-25: qty 10

## 2015-11-25 MED ORDER — ACETAMINOPHEN 325 MG PO TABS: 650.0000 mg | ORAL_TABLET | Freq: Four times a day (QID) | ORAL | Status: DC | PRN

## 2015-11-25 MED ORDER — DIPHENHYDRAMINE HCL 12.5 MG/5ML PO ELIX: 12.5000 mg | ORAL_SOLUTION | ORAL | Status: DC | PRN

## 2015-11-25 MED ORDER — POLYETHYLENE GLYCOL 3350 17 G PO PACK: 17.0000 g | PACK | Freq: Every day | ORAL | Status: DC | PRN

## 2015-11-25 MED ORDER — EPHEDRINE SULFATE 50 MG/ML IJ SOLN
INTRAMUSCULAR | Status: AC
  Filled 2015-11-25: qty 1

## 2015-11-25 MED ORDER — OXYCODONE HCL 5 MG PO TABS
5.0000 mg | ORAL_TABLET | ORAL | Status: DC | PRN
  Administered 2015-11-25: 10 mg via ORAL

## 2015-11-25 MED ORDER — VANCOMYCIN HCL IN DEXTROSE 1-5 GM/200ML-% IV SOLN
1000.0000 mg | Freq: Two times a day (BID) | INTRAVENOUS | Status: AC
  Administered 2015-11-25: 1000 mg via INTRAVENOUS
  Filled 2015-11-25: qty 200

## 2015-11-25 MED ORDER — MAGNESIUM CITRATE PO SOLN: 1.0000 | Freq: Once | ORAL | Status: DC | PRN

## 2015-11-25 MED ORDER — FENTANYL CITRATE (PF) 250 MCG/5ML IJ SOLN
INTRAMUSCULAR | Status: AC
Start: 2015-11-25 — End: 2015-11-25
  Filled 2015-11-25: qty 5

## 2015-11-25 MED ORDER — PROMETHAZINE HCL 25 MG/ML IJ SOLN: 6.2500 mg | INTRAMUSCULAR | Status: DC | PRN

## 2015-11-25 MED ORDER — SALINE FLUSH 0.9 % IV SOLN
INTRAVENOUS | Status: DC | PRN
  Administered 2015-11-25: 40 mL

## 2015-11-25 MED ORDER — LIDOCAINE HCL (CARDIAC) 20 MG/ML IV SOLN
INTRAVENOUS | Status: DC | PRN
  Administered 2015-11-25: 100 mg via INTRATRACHEAL

## 2015-11-25 MED ORDER — APIXABAN 2.5 MG PO TABS
2.5000 mg | ORAL_TABLET | Freq: Two times a day (BID) | ORAL | Status: DC
  Administered 2015-11-26 – 2015-11-28 (×5): 2.5 mg via ORAL
  Filled 2015-11-25 (×5): qty 1

## 2015-11-25 MED ORDER — MENTHOL 3 MG MT LOZG: 1.0000 | LOZENGE | OROMUCOSAL | Status: DC | PRN

## 2015-11-25 MED ORDER — PROPOFOL 10 MG/ML IV BOLUS
INTRAVENOUS | Status: AC
Start: 2015-11-25 — End: 2015-11-25
  Filled 2015-11-25: qty 20

## 2015-11-25 MED ORDER — TAMSULOSIN HCL 0.4 MG PO CAPS
0.4000 mg | ORAL_CAPSULE | Freq: Every day | ORAL | Status: DC
  Administered 2015-11-26 – 2015-11-28 (×3): 0.4 mg via ORAL
  Filled 2015-11-25 (×3): qty 1

## 2015-11-25 MED ORDER — METOCLOPRAMIDE HCL 5 MG/ML IJ SOLN
5.0000 mg | Freq: Three times a day (TID) | INTRAMUSCULAR | Status: DC | PRN
  Administered 2015-11-26: 10 mg via INTRAVENOUS
  Filled 2015-11-25: qty 2

## 2015-11-25 MED ORDER — EPHEDRINE SULFATE 50 MG/ML IJ SOLN
INTRAMUSCULAR | Status: DC | PRN
  Administered 2015-11-25: 10 mg via INTRAVENOUS
  Administered 2015-11-25 (×2): 15 mg via INTRAVENOUS

## 2015-11-25 MED ORDER — NASAL SPRAY 0.05 % NA SOLN: Freq: Every evening | NASAL | Status: DC | PRN

## 2015-11-25 MED ORDER — POTASSIUM CHLORIDE IN NACL 20-0.9 MEQ/L-% IV SOLN
INTRAVENOUS | Status: DC
  Administered 2015-11-25 – 2015-11-27 (×4): via INTRAVENOUS
  Filled 2015-11-25 (×4): qty 1000

## 2015-11-25 MED ORDER — PHENYLEPHRINE HCL 10 MG/ML IJ SOLN
INTRAMUSCULAR | Status: DC | PRN
  Administered 2015-11-25 (×2): 120 ug via INTRAVENOUS
  Administered 2015-11-25: 80 ug via INTRAVENOUS
  Administered 2015-11-25: 40 ug via INTRAVENOUS
  Administered 2015-11-25 (×3): 120 ug via INTRAVENOUS
  Administered 2015-11-25: 80 ug via INTRAVENOUS

## 2015-11-25 MED ORDER — ZOLPIDEM TARTRATE 5 MG PO TABS: 2.5000 mg | ORAL_TABLET | Freq: Every evening | ORAL | Status: DC | PRN

## 2015-11-25 MED ORDER — GLYCOPYRROLATE 0.2 MG/ML IJ SOLN
INTRAMUSCULAR | Status: AC
  Filled 2015-11-25: qty 1

## 2015-11-25 MED ORDER — FENTANYL CITRATE (PF) 250 MCG/5ML IJ SOLN
INTRAMUSCULAR | Status: AC
  Filled 2015-11-25: qty 5

## 2015-11-25 MED ORDER — BUPIVACAINE LIPOSOME 1.3 % IJ SUSP
20.0000 mL | Freq: Once | INTRAMUSCULAR | Status: DC
  Filled 2015-11-25: qty 20

## 2015-11-25 MED ORDER — CELECOXIB 200 MG PO CAPS
200.0000 mg | ORAL_CAPSULE | Freq: Two times a day (BID) | ORAL | Status: DC
  Administered 2015-11-25 – 2015-11-28 (×6): 200 mg via ORAL
  Filled 2015-11-25 (×6): qty 1

## 2015-11-25 MED ORDER — SODIUM CHLORIDE 0.9 % IR SOLN
Status: DC | PRN
  Administered 2015-11-25: 1000 mL

## 2015-11-25 MED ORDER — GLYCOPYRROLATE 0.2 MG/ML IJ SOLN
INTRAMUSCULAR | Status: DC | PRN
  Administered 2015-11-25: 0.2 mg via INTRAVENOUS
  Administered 2015-11-25 (×2): 0.1 mg via INTRAVENOUS

## 2015-11-25 MED ORDER — BUPIVACAINE LIPOSOME 1.3 % IJ SUSP
INTRAMUSCULAR | Status: DC | PRN
  Administered 2015-11-25: 20 mL

## 2015-11-25 MED ORDER — APIXABAN 2.5 MG PO TABS: ORAL_TABLET | ORAL | Status: AC

## 2015-11-25 MED ORDER — LIDOCAINE HCL (CARDIAC) 20 MG/ML IV SOLN
INTRAVENOUS | Status: AC
  Filled 2015-11-25: qty 5

## 2015-11-25 MED ORDER — PHENOL 1.4 % MT LIQD
1.0000 | OROMUCOSAL | Status: DC | PRN
Start: 2015-11-25 — End: 2015-11-28

## 2015-11-25 MED ORDER — PROPOFOL 500 MG/50ML IV EMUL
INTRAVENOUS | Status: DC | PRN
  Administered 2015-11-25: 10 ug/kg/min via INTRAVENOUS
  Administered 2015-11-25: 13:00:00 via INTRAVENOUS

## 2015-11-25 MED ORDER — FAMOTIDINE-CA CARB-MAG HYDROX 10-800-165 MG PO CHEW: 1.0000 | CHEWABLE_TABLET | Freq: Every day | ORAL | Status: DC | PRN

## 2015-11-25 MED ORDER — SALINE SPRAY 0.65 % NA SOLN
1.0000 | Freq: Every evening | NASAL | Status: DC | PRN
  Filled 2015-11-25: qty 44

## 2015-11-25 MED ORDER — HYDROCHLOROTHIAZIDE 12.5 MG PO CAPS
12.5000 mg | ORAL_CAPSULE | Freq: Every day | ORAL | Status: DC
  Administered 2015-11-25 – 2015-11-28 (×4): 12.5 mg via ORAL
  Filled 2015-11-25 (×4): qty 1

## 2015-11-25 MED ORDER — DOCUSATE SODIUM 100 MG PO CAPS
100.0000 mg | ORAL_CAPSULE | Freq: Two times a day (BID) | ORAL | Status: DC
  Administered 2015-11-25 – 2015-11-28 (×6): 100 mg via ORAL
  Filled 2015-11-25 (×6): qty 1

## 2015-11-25 MED ORDER — MEPERIDINE HCL 25 MG/ML IJ SOLN: 6.2500 mg | INTRAMUSCULAR | Status: DC | PRN

## 2015-11-25 MED ORDER — DILTIAZEM HCL ER COATED BEADS 240 MG PO CP24
240.0000 mg | ORAL_CAPSULE | Freq: Every day | ORAL | Status: DC
  Administered 2015-11-27 – 2015-11-28 (×2): 240 mg via ORAL
  Filled 2015-11-25 (×3): qty 1

## 2015-11-25 MED ORDER — METHOCARBAMOL 500 MG PO TABS
500.0000 mg | ORAL_TABLET | Freq: Four times a day (QID) | ORAL | Status: DC | PRN
Start: 2015-11-25 — End: 2015-11-28
  Administered 2015-11-25 – 2015-11-28 (×4): 500 mg via ORAL
  Filled 2015-11-25 (×4): qty 1

## 2015-11-25 MED ORDER — ACETAMINOPHEN 650 MG RE SUPP: 650.0000 mg | Freq: Four times a day (QID) | RECTAL | Status: DC | PRN

## 2015-11-25 MED ORDER — FENTANYL CITRATE (PF) 250 MCG/5ML IJ SOLN
INTRAMUSCULAR | Status: DC | PRN
  Administered 2015-11-25: 50 ug via INTRAVENOUS
  Administered 2015-11-25: 150 ug via INTRAVENOUS
  Administered 2015-11-25 (×3): 50 ug via INTRAVENOUS

## 2015-11-25 MED ORDER — ARTIFICIAL TEARS OP OINT
TOPICAL_OINTMENT | OPHTHALMIC | Status: DC | PRN
  Administered 2015-11-25: 1 via OPHTHALMIC

## 2015-11-25 MED ORDER — ONDANSETRON HCL 4 MG PO TABS: 4.0000 mg | ORAL_TABLET | Freq: Four times a day (QID) | ORAL | Status: DC | PRN

## 2015-11-25 MED ORDER — ONDANSETRON HCL 4 MG/2ML IJ SOLN
INTRAMUSCULAR | Status: AC
  Filled 2015-11-25: qty 2

## 2015-11-25 MED ORDER — FAMOTIDINE 20 MG PO TABS
10.0000 mg | ORAL_TABLET | Freq: Every day | ORAL | Status: DC | PRN
  Administered 2015-11-26: 10 mg via ORAL
  Filled 2015-11-25: qty 1

## 2015-11-25 MED ORDER — KETOROLAC TROMETHAMINE 15 MG/ML IJ SOLN
15.0000 mg | Freq: Once | INTRAMUSCULAR | Status: AC
  Administered 2015-11-25: 15 mg via INTRAVENOUS
  Filled 2015-11-25: qty 1

## 2015-11-25 MED ORDER — METOCLOPRAMIDE HCL 5 MG PO TABS: 5.0000 mg | ORAL_TABLET | Freq: Three times a day (TID) | ORAL | Status: DC | PRN

## 2015-11-25 MED ORDER — ONDANSETRON HCL 4 MG/2ML IJ SOLN
4.0000 mg | Freq: Four times a day (QID) | INTRAMUSCULAR | Status: DC | PRN
  Administered 2015-11-25 – 2015-11-26 (×3): 4 mg via INTRAVENOUS
  Filled 2015-11-25 (×4): qty 2

## 2015-11-25 MED ORDER — OXYCODONE HCL 5 MG PO TABS
5.0000 mg | ORAL_TABLET | ORAL | Status: DC | PRN
  Administered 2015-11-25 – 2015-11-27 (×5): 15 mg via ORAL
  Administered 2015-11-27: 5 mg via ORAL
  Administered 2015-11-28: 15 mg via ORAL
  Filled 2015-11-25 (×8): qty 3

## 2015-11-25 MED ORDER — ALBUTEROL SULFATE HFA 108 (90 BASE) MCG/ACT IN AERS
INHALATION_SPRAY | RESPIRATORY_TRACT | Status: AC
  Filled 2015-11-25: qty 6.7

## 2015-11-25 MED ORDER — DEXTROSE 5 % IV SOLN
500.0000 mg | Freq: Four times a day (QID) | INTRAVENOUS | Status: DC | PRN
  Filled 2015-11-25: qty 5

## 2015-11-25 MED ORDER — ONDANSETRON HCL 4 MG/2ML IJ SOLN
INTRAMUSCULAR | Status: DC | PRN
  Administered 2015-11-25: 4 mg via INTRAVENOUS

## 2015-11-25 MED ORDER — BUPIVACAINE HCL (PF) 0.5 % IJ SOLN
INTRAMUSCULAR | Status: DC | PRN
  Administered 2015-11-25: 10 mL

## 2015-11-25 MED ORDER — SUCCINYLCHOLINE CHLORIDE 20 MG/ML IJ SOLN
INTRAMUSCULAR | Status: AC
  Filled 2015-11-25: qty 1

## 2015-11-25 MED ORDER — ALBUTEROL SULFATE HFA 108 (90 BASE) MCG/ACT IN AERS
INHALATION_SPRAY | RESPIRATORY_TRACT | Status: DC | PRN
  Administered 2015-11-25: 2 via RESPIRATORY_TRACT

## 2015-11-25 MED ORDER — OXYCODONE HCL 5 MG PO TABS
ORAL_TABLET | ORAL | Status: AC
  Filled 2015-11-25: qty 2

## 2015-11-25 MED ORDER — METOPROLOL TARTRATE 25 MG PO TABS
25.0000 mg | ORAL_TABLET | Freq: Two times a day (BID) | ORAL | Status: DC
  Administered 2015-11-25 – 2015-11-28 (×6): 25 mg via ORAL
  Filled 2015-11-25 (×6): qty 1

## 2015-11-25 MED ORDER — BUPIVACAINE HCL (PF) 0.5 % IJ SOLN
INTRAMUSCULAR | Status: AC
  Filled 2015-11-25: qty 10

## 2015-11-25 MED ORDER — LISINOPRIL-HYDROCHLOROTHIAZIDE 20-12.5 MG PO TABS: 1.0000 | ORAL_TABLET | Freq: Every day | ORAL | Status: DC

## 2015-11-25 MED ORDER — LISINOPRIL 20 MG PO TABS
20.0000 mg | ORAL_TABLET | Freq: Every day | ORAL | Status: DC
  Administered 2015-11-25 – 2015-11-28 (×3): 20 mg via ORAL
  Filled 2015-11-25 (×4): qty 1

## 2015-11-25 SURGICAL SUPPLY — 89 items
APL SKNCLS STERI-STRIP NONHPOA (GAUZE/BANDAGES/DRESSINGS) ×1
BANDAGE ACE 4X5 VEL STRL LF (GAUZE/BANDAGES/DRESSINGS) ×2 IMPLANT
BANDAGE ACE 6X5 VEL STRL LF (GAUZE/BANDAGES/DRESSINGS) ×2 IMPLANT
BANDAGE ELASTIC 4 VELCRO ST LF (GAUZE/BANDAGES/DRESSINGS) ×3 IMPLANT
BANDAGE ELASTIC 6 VELCRO ST LF (GAUZE/BANDAGES/DRESSINGS) ×3 IMPLANT
BANDAGE ESMARK 6X9 LF (GAUZE/BANDAGES/DRESSINGS) ×1 IMPLANT
BASEPLATE TIBIA REVIS 6 R/L (Joint) IMPLANT
BENZOIN TINCTURE PRP APPL 2/3 (GAUZE/BANDAGES/DRESSINGS) ×2 IMPLANT
BLADE SAG 18X100X1.27 (BLADE) ×3 IMPLANT
BLADE SAW SGTL 13.0X1.19X90.0M (BLADE) ×3 IMPLANT
BLADE SURG 10 STRL SS (BLADE) ×6 IMPLANT
BLADE SURG ROTATE 9660 (MISCELLANEOUS) IMPLANT
BNDG CMPR 9X6 STRL LF SNTH (GAUZE/BANDAGES/DRESSINGS) ×1
BNDG CONFORM 3 STRL LF (GAUZE/BANDAGES/DRESSINGS) ×2 IMPLANT
BNDG ESMARK 6X9 LF (GAUZE/BANDAGES/DRESSINGS) ×3
BONE CEMENT SIMPLEX TOBRAMYCIN (Cement) ×6 IMPLANT
BOWL SMART MIX CTS (DISPOSABLE) ×2 IMPLANT
BSPLAT TIB 6 UNV CMNT TL STAB (Joint) ×1 IMPLANT
CEMENT BONE SIMPLEX SPEEDSET (Cement) ×6 IMPLANT
CEMENT BONE SIMPLEX TOBRAMYCIN (Cement) IMPLANT
CLOSURE STERI-STRIP 1/2X4 (GAUZE/BANDAGES/DRESSINGS) ×1
CLSR STERI-STRIP ANTIMIC 1/2X4 (GAUZE/BANDAGES/DRESSINGS) ×1 IMPLANT
COVER SURGICAL LIGHT HANDLE (MISCELLANEOUS) ×5 IMPLANT
CUFF TOURNIQUET SINGLE 34IN LL (TOURNIQUET CUFF) IMPLANT
DRAPE EXTREMITY T 121X128X90 (DRAPE) ×3 IMPLANT
DRAPE IMP U-DRAPE 54X76 (DRAPES) ×3 IMPLANT
DRAPE U-SHAPE 47X51 STRL (DRAPES) ×3 IMPLANT
DRSG PAD ABDOMINAL 8X10 ST (GAUZE/BANDAGES/DRESSINGS) ×5 IMPLANT
DURAPREP 26ML APPLICATOR (WOUND CARE) ×6 IMPLANT
ELECT CAUTERY BLADE 6.4 (BLADE) ×3 IMPLANT
ELECT REM PT RETURN 9FT ADLT (ELECTROSURGICAL) ×3
ELECTRODE REM PT RTRN 9FT ADLT (ELECTROSURGICAL) ×1 IMPLANT
EVACUATOR 1/8 PVC DRAIN (DRAIN) IMPLANT
FACESHIELD WRAPAROUND (MASK) ×3 IMPLANT
FACESHIELD WRAPAROUND OR TEAM (MASK) ×1 IMPLANT
GAUZE SPONGE 4X4 12PLY STRL (GAUZE/BANDAGES/DRESSINGS) ×3 IMPLANT
GAUZE XEROFORM 1X8 LF (GAUZE/BANDAGES/DRESSINGS) ×3 IMPLANT
GLOVE BIOGEL PI IND STRL 7.0 (GLOVE) ×1 IMPLANT
GLOVE BIOGEL PI INDICATOR 7.0 (GLOVE) ×2
GLOVE ECLIPSE 6.5 STRL STRAW (GLOVE) ×4 IMPLANT
GLOVE ECLIPSE 7.0 STRL STRAW (GLOVE) ×3 IMPLANT
GLOVE ORTHO TXT STRL SZ7.5 (GLOVE) ×3 IMPLANT
GOWN STRL REUS W/ TWL LRG LVL3 (GOWN DISPOSABLE) ×3 IMPLANT
GOWN STRL REUS W/ TWL XL LVL3 (GOWN DISPOSABLE) ×1 IMPLANT
GOWN STRL REUS W/TWL LRG LVL3 (GOWN DISPOSABLE) ×9
GOWN STRL REUS W/TWL XL LVL3 (GOWN DISPOSABLE) ×3
HANDPIECE INTERPULSE COAX TIP (DISPOSABLE) ×3
IMMOBILIZER KNEE 22 UNIV (SOFTGOODS) ×1 IMPLANT
IMMOBILIZER KNEE 24 THIGH 36 (MISCELLANEOUS) IMPLANT
IMMOBILIZER KNEE 24 UNIV (MISCELLANEOUS) ×3
INSERT TIBIAL TTL STABIL 22MM (Knees) ×1 IMPLANT
KIT BASIN OR (CUSTOM PROCEDURE TRAY) ×3 IMPLANT
KIT ROOM TURNOVER OR (KITS) ×3 IMPLANT
KNEE FEM COMP 7 L (Joint) ×2 IMPLANT
MANIFOLD NEPTUNE II (INSTRUMENTS) ×3 IMPLANT
NDL 18GX1X1/2 (RX/OR ONLY) (NEEDLE) ×1 IMPLANT
NEEDLE 18GX1X1/2 (RX/OR ONLY) (NEEDLE) ×3 IMPLANT
NEEDLE 22X1 1/2 (OR ONLY) (NEEDLE) ×3 IMPLANT
NS IRRIG 1000ML POUR BTL (IV SOLUTION) ×3 IMPLANT
PACK TOTAL JOINT (CUSTOM PROCEDURE TRAY) ×3 IMPLANT
PACK UNIVERSAL I (CUSTOM PROCEDURE TRAY) ×3 IMPLANT
PAD ARMBOARD 7.5X6 YLW CONV (MISCELLANEOUS) ×6 IMPLANT
PAD CAST 4YDX4 CTTN HI CHSV (CAST SUPPLIES) ×1 IMPLANT
PADDING CAST COTTON 4X4 STRL (CAST SUPPLIES) ×3
PADDING CAST COTTON 6X4 STRL (CAST SUPPLIES) ×3 IMPLANT
PATELLA ASYMMETRIC 38X11 (Knees) ×2 IMPLANT
PENCIL BUTTON HOLSTER BLD 10FT (ELECTRODE) ×2 IMPLANT
SET HNDPC FAN SPRY TIP SCT (DISPOSABLE) IMPLANT
SPONGE GAUZE 4X4 12PLY STER LF (GAUZE/BANDAGES/DRESSINGS) ×4 IMPLANT
STEM FLLUTED TRI 15X100MM (Stem) ×2 IMPLANT
SUCTION FRAZIER HANDLE 10FR (MISCELLANEOUS) ×2
SUCTION TUBE FRAZIER 10FR DISP (MISCELLANEOUS) ×1 IMPLANT
SUT MNCRL AB 4-0 PS2 18 (SUTURE) ×3 IMPLANT
SUT VIC AB 0 CT1 27 (SUTURE) ×6
SUT VIC AB 0 CT1 27XBRD ANBCTR (SUTURE) IMPLANT
SUT VIC AB 1 CTX 36 (SUTURE) ×3
SUT VIC AB 1 CTX36XBRD ANBCTR (SUTURE) ×1 IMPLANT
SUT VIC AB 2-0 CT1 27 (SUTURE) ×15
SUT VIC AB 2-0 CT1 TAPERPNT 27 (SUTURE) ×2 IMPLANT
SYR 50ML LL SCALE MARK (SYRINGE) ×3 IMPLANT
TIB REV BASEPLATE 6 R/L (Joint) ×3 IMPLANT
TIBIAL AUGMENT STRL LEFT 10MM (Knees) ×1 IMPLANT
TIBIAL AUGMENT STRL RIGHT 10MM (Knees) ×1 IMPLANT
TOWEL OR 17X24 6PK STRL BLUE (TOWEL DISPOSABLE) ×3 IMPLANT
TOWEL OR 17X26 10 PK STRL BLUE (TOWEL DISPOSABLE) ×3 IMPLANT
TRAY CATH 16FR W/PLASTIC CATH (SET/KITS/TRAYS/PACK) ×2 IMPLANT
TRAY FOLEY CATH 16FRSI W/METER (SET/KITS/TRAYS/PACK) IMPLANT
TUBE ANAEROBIC SPECIMEN COL (MISCELLANEOUS) IMPLANT
WATER STERILE IRR 1000ML POUR (IV SOLUTION) ×3 IMPLANT

## 2015-11-25 NOTE — Progress Notes (Signed)
Orthopedic Tech Progress Note Patient Details:  KATLIN SON 02-19-1946 [id number] On cpm at 1900 Patient ID: Oretha Milch, male   DOB: 12/25/1945, 70 y.o.   MRN: [id number]   Braulio Bosch 11/25/2015, 7:05 PM

## 2015-11-25 NOTE — Anesthesia Preprocedure Evaluation (Addendum)
Anesthesia Evaluation  Patient identified by MRN, date of birth, ID band Patient awake    Reviewed: Allergy & Precautions, H&P , NPO status , Patient's Chart, lab work & pertinent test results, reviewed documented beta blocker date and time   Airway Mallampati: III  TM Distance: >3 FB Neck ROM: Full    Dental  (+) Teeth Intact   Pulmonary    Pulmonary exam normal - rhonchi       Cardiovascular hypertension, Pt. on home beta blockers and Pt. on medications + Peripheral Vascular Disease   Rhythm:Regular Rate:Normal     Neuro/Psych    GI/Hepatic GERD  ,  Endo/Other  obesity  Renal/GU Renal InsufficiencyRenal disease     Musculoskeletal  (+) Arthritis ,   Abdominal (+) + obese,   Peds  Hematology  (+) anemia ,   Anesthesia Other Findings   Reproductive/Obstetrics                             Anesthesia Physical  Anesthesia Plan  ASA: II  Anesthesia Plan: General   Post-op Pain Management:    Induction: Intravenous  Airway Management Planned: Oral ETT  Additional Equipment:   Intra-op Plan:   Post-operative Plan: Extubation in OR  Informed Consent: I have reviewed the patients History and Physical, chart, labs and discussed the procedure including the risks, benefits and alternatives for the proposed anesthesia with the patient or authorized representative who has indicated his/her understanding and acceptance.   Dental advisory given  Plan Discussed with: CRNA, Surgeon and Anesthesiologist  Anesthesia Plan Comments:        Anesthesia Quick Evaluation

## 2015-11-25 NOTE — Progress Notes (Signed)
Utilization review completed.  

## 2015-11-25 NOTE — Interval H&P Note (Signed)
History and Physical Interval Note:  11/25/2015 8:26 AM  Marc Maldonado  has presented today for surgery, with the diagnosis of LEFT KNEE REVISION  The various methods of treatment have been discussed with the patient and family. After consideration of risks, benefits and other options for treatment, the patient has consented to  Procedure(s): LEFT TOTAL KNEE REVISION (Left) as a surgical intervention .  The patient's history has been reviewed, patient examined, no change in status, stable for surgery.  I have reviewed the patient's chart and labs.  Questions were answered to the patient's satisfaction.     [name]

## 2015-11-25 NOTE — Discharge Summary (Addendum)
Patient ID: Marc Maldonado MRN: [id number] DOB/AGE: 70/24/1947 70 y.o.  Admit date: 11/25/2015 Discharge date: 11/27/2015  Admission Diagnoses:  Active Problems:   S/P revision of total knee   Discharge Diagnoses:  Same  Past Medical History  Diagnosis Date  . BPH (benign prostatic hyperplasia)   . Wears glasses   . Hypertension   . GERD (gastroesophageal reflux disease)     occas. indigestion   . Chronic kidney disease     renal stone-(2) tumors removed fr. bladder  . Cancer ([place])     small section of bowel removed that was malignant - no further treatment   . Arthritis     low back & neck,- DDD  . DJD (degenerative joint disease)   . Edema of left lower extremity 10/27/2015  . Blood clot in vein     to arm caused by a picc line-per pt  . Anemia     Surgeries: Procedure(s): LEFT TOTAL KNEE REVISION on 11/25/2015   Consultants:    Discharged Condition: Improved  Hospital Course: Marc Maldonado is an 70 y.o. male who was admitted 11/25/2015 for operative treatment of left prosthetic knee infection. Patient has severe unremitting pain that affects sleep, daily activities, and work/hobbies. After pre-op clearance the patient was taken to the operating room on 11/25/2015 and underwent  Procedure(s): LEFT TOTAL KNEE REVISION.  Patient with a pre-op Hb of 13.3 developed ABLA on pod #1 with a Hb of 8.6.  He became symptomatic so we transfused with 2 units of prbc.  We will continue to follow.  Patient was given perioperative antibiotics:      Anti-infectives    Start     Dose/Rate Route Frequency Ordered Stop   11/25/15 2200  vancomycin (VANCOCIN) IVPB 1000 mg/200 mL premix     1,000 mg 200 mL/hr over 60 Minutes Intravenous Every 12 hours 11/25/15 1642 11/25/15 2156   11/25/15 1800  clindamycin (CLEOCIN) capsule 150 mg     150 mg Oral 4 times per day 11/25/15 1642     11/25/15 0600  vancomycin (VANCOCIN) IVPB 1000 mg/200 mL premix     1,000 mg 200 mL/hr over 60 Minutes  Intravenous On call to O.R. 11/24/15 1349 11/25/15 1055       Patient was given sequential compression devices, early ambulation, and chemoprophylaxis to prevent DVT.  Patient benefited maximally from hospital stay and there were no complications.    Recent vital signs:  Patient Vitals for the past 24 hrs:  BP Temp Temp src Pulse Resp SpO2  11/27/15 0500 122/70 mmHg 98.2 F (36.8 C) Oral 84 18 94 %  11/26/15 2327 113/60 mmHg 98.6 F (37 C) Oral 91 16 92 %  11/26/15 2117 (!) 128/46 mmHg 98.9 F (37.2 C) Oral 73 16 95 %  11/26/15 2109 (!) 122/59 mmHg 98.6 F (37 C) Oral 83 16 94 %  11/26/15 2029 (!) 114/55 mmHg 98.4 F (36.9 C) Oral 86 18 96 %  11/26/15 1952 (!) 124/57 mmHg 98.2 F (36.8 C) Oral 75 16 97 %  11/26/15 1703 (!) 99/48 mmHg 98.1 F (36.7 C) Oral 73 16 95 %  11/26/15 1630 (!) 91/54 mmHg 98 F (36.7 C) - 73 16 93 %  11/26/15 1355 (!) 97/48 mmHg 98 F (36.7 C) Oral 72 17 91 %  11/26/15 1052 (!) 92/50 mmHg - - 60 - -     Recent laboratory studies:   Recent Labs  11/26/15 0527 11/27/15 0510  WBC 8.9  7.3  HGB 8.6* 9.2*  HCT 24.9* 26.2*  PLT 106* 93*  NA 140 139  K 4.2 3.7  CL 105 101  CO2 26 29  BUN 20 27*  CREATININE 1.79* 2.03*  GLUCOSE 129* 111*  CALCIUM 8.1* 8.2*     Discharge Medications:     Medication List    STOP taking these medications        GLUCOSAMINE HCL-MSM PO      TAKE these medications        apixaban 2.5 MG Tabs tablet  Commonly known as:  ELIQUIS  Take 1 tab po q12 hours x 4 weeks following surgery to prevent blood clots     bisacodyl 5 MG EC tablet  Commonly known as:  DULCOLAX  Take 1 tablet (5 mg total) by mouth daily as needed for moderate constipation.     cholecalciferol 1000 units tablet  Commonly known as:  VITAMIN D  Take 1,000 Units by mouth daily.     clindamycin 150 MG capsule  Commonly known as:  CLEOCIN  Take by mouth every 6 (six) hours.     CoQ-10 200 MG Caps  Take 1 capsule by mouth daily.      diltiazem 240 MG 24 hr capsule  Commonly known as:  CARDIZEM CD  Take 240 mg by mouth daily.     docusate sodium 100 MG capsule  Commonly known as:  COLACE  Take 100 mg by mouth daily as needed for moderate constipation.     famotidine-calcium carbonate-magnesium hydroxide 10-800-165 MG chewable tablet  Commonly known as:  PEPCID COMPLETE  Chew 1 tablet by mouth daily as needed (heartburn).     lisinopril-hydrochlorothiazide 20-12.5 MG tablet  Commonly known as:  PRINZIDE,ZESTORETIC  Take 1 tablet by mouth daily.     methocarbamol 500 MG tablet  Commonly known as:  ROBAXIN  Take 1 tablet (500 mg total) by mouth 4 (four) times daily.     metoprolol tartrate 25 MG tablet  Commonly known as:  LOPRESSOR  Take 25 mg by mouth 2 (two) times daily.     multivitamin with minerals tablet  Take 1 tablet by mouth daily.     multivitamin with minerals Tabs tablet  Take 1 tablet by mouth daily.     NASAL SPRAY NA  Place 1 spray into both nostrils at bedtime as needed (congestion).     ondansetron 4 MG tablet  Commonly known as:  ZOFRAN  Take 1 tablet (4 mg total) by mouth every 8 (eight) hours as needed for nausea or vomiting.     oxyCODONE-acetaminophen 5-325 MG tablet  Commonly known as:  ROXICET  Take 1-2 tablets by mouth every 4 (four) hours as needed.     PROBIOTIC DAILY PO  Take 1 capsule by mouth daily.     tadalafil 5 MG tablet  Commonly known as:  CIALIS  Take 2.5 mg by mouth daily after supper.     tamsulosin 0.4 MG Caps capsule  Commonly known as:  FLOMAX  Take 0.4 mg by mouth daily before breakfast.     VITAMIN B 12 PO  Take 1 tablet by mouth daily.     vitamin C 250 MG tablet  Commonly known as:  ASCORBIC ACID  Take 250 mg by mouth daily.     zolpidem 5 MG tablet  Commonly known as:  AMBIEN  Take 2.5 mg by mouth at bedtime as needed for sleep.        Diagnostic  Studies: Dg Knee Left Port  11/25/2015  CLINICAL DATA:  70 year old male undergoing left  knee arthroplasty revision EXAM: PORTABLE LEFT KNEE - 1-2 VIEW COMPARISON:  Preoperative radiographs 08/12/2015 FINDINGS: Interval removal of previous arthroplasty hardware. A new long stem femoral and tibial component are present. Three intraoperative views demonstrate no evidence of periprosthetic fracture or other immediate hardware complication. There is soft tissue swelling about the knee. Scattered subcutaneous emphysema not unexpected in the acute postoperative period. IMPRESSION: Revision left total knee arthroplasty with long stem femoral and tibial components. No evidence of immediate complication. Electronically Signed   By: Jacqulynn Cadet M.D.   On: 11/25/2015 15:25    Disposition: 06-Home-Health Care Svc    Follow-up Information    Follow up with [name], MD. Schedule an appointment as soon as possible for a visit in 2 weeks.   Specialty:  Orthopedic Surgery   Contact information:   [address] [place] [address] [phone]        Signed: Fannie Knee 11/27/2015, 7:47 AM

## 2015-11-25 NOTE — H&P (View-Only) (Signed)
TOTAL KNEE REVISION ADMISSION H&P  Patient is being admitted for left revision total knee arthroplasty.  Subjective:  Chief Complaint:left knee pain.  HPI: Marc Maldonado, 70 y.o. male, has a history of pain and functional disability in the left knee(s) due to infection and patient has failed non-surgical conservative treatments for greater than 12 weeks to include NSAID's and/or analgesics and use of assistive devices. The indications for the revision of the total knee arthroplasty are history of total knee infection. Onset of symptoms was gradual starting 2 years ago with gradually worsening course since that time.  Prior procedures on the left knee(s) include arthroplasty.  Patient currently rates pain in the left knee(s) at 5 out of 10 with activity. There is night pain, worsening of pain with activity and weight bearing, pain that interferes with activities of daily living and pain with passive range of motion.  Patient has evidence of cement spacer by imaging studies. This condition presents safety issues increasing the risk of falls. This patient has had post-op infection.  There is no current active infection.  Patient Active Problem List   Diagnosis Date Noted  . Edema of left lower extremity 10/27/2015  . S/P revision of total knee   . Prosthetic joint infection ([place])   . 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 DVT of axillary vein, acute right 02/26/2014  . Postoperative anemia due to acute blood loss 10/20/2013  . Normocytic anemia 10/18/2013  . Acute renal insufficiency 10/18/2013  . Hyperglycemia 10/18/2013  . Bacteremia due to Staphylococcus 10/18/2013  . 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 DJD (degenerative joint disease) of knee 10/02/2013  . Essential hypertension, benign 09/23/2013  . GERD (gastroesophageal reflux disease) 09/23/2013  . 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 BPH (benign prostatic hyperplasia)   . Wears glasses   . Hypertension   . GERD (gastroesophageal reflux disease)     occas. indigestion   . Chronic kidney disease     renal stone-(2) tumors removed fr. bladder  . Cancer (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 Arthritis     low back & neck,- DDD  . DJD (degenerative joint disease)   . Edema of left lower extremity 10/27/2015    Past Surgical History  Procedure Laterality Date  . Total knee arthroplasty  2008    rt  . Total knee arthroplasty  2003    left  . Tonsillectomy    . Carpal tunnel release  2012    rt and lt  . Hernia repair  2007    umb hr  . Cystoscopy with biopsy      bladder tumor  . Colonoscopy    . Shoulder arthroscopy w/ rotator cuff repair  2010 & 2013    bilateral   . Total knee revision Right 07/24/2013    Procedure: TOTAL KNEE REVISION;  Surgeon: [name], MD;  Location: [place];  Service: Orthopedics;  Laterality: Right;  . Total knee revision Left 10/02/2013    Procedure: TOTAL KNEE REVISION, tibial component;  Surgeon: [name], MD;  Location: [facility];  Service: Orthopedics;  Laterality: Left;  . Total knee revision Left 10/18/13  . I&d knee with poly exchange Left 10/18/2013    Procedure: IRRIGATION AND DEBRIDEMENT KNEE WITH POLY EXCHANGE;  Surgeon: Christia Reading  Maryla Morrow, MD;  Location: [facility];  Service: Orthopedics;  Laterality: Left;  . Revision total knee arthroplasty Left 08/12/2015  . Total knee revision Left 08/12/2015    Procedure: LEFT TOTAL KNEE REVISION;  Surgeon: [name], MD;  Location: [facility];  Service: Orthopedics;  Laterality: Left;     (Not in a hospital admission) No Known Allergies  Social History  Substance Use Topics  . Smoking status: Never Smoker   . Smokeless tobacco: Never Used  . Alcohol Use: No    No family history on file.    Review of Systems  Constitutional: Negative.   HENT: Negative.    Eyes: Negative.   Respiratory: Negative.   Cardiovascular: Negative.   Gastrointestinal: Negative.   Genitourinary: Negative.   Musculoskeletal: Positive for joint pain.  Skin: Negative.   Neurological: Negative.   Endo/Heme/Allergies: Bruises/bleeds easily.  Psychiatric/Behavioral: Negative.      Objective:  Physical Exam  Constitutional: He is oriented to person, place, and time. He appears well-developed and well-nourished.  HENT:  Head: Normocephalic and atraumatic.  Eyes: EOM are normal. Pupils are equal, round, and reactive to light.  Neck: Normal range of motion. Neck supple.  Cardiovascular: Normal rate and regular rhythm.   Respiratory: Effort normal and breath sounds normal.  GI: Soft. Bowel sounds are normal.  Musculoskeletal:  Specific exam reveals really no warmth.  Limited motion of his knee as expected.  He does have a 2-3+ effusion, but this is non-tense and does not feel infectious.  Some distal edema because of the knee swelling.    Neurological: He is alert and oriented to person, place, and time.  Skin: Skin is warm and dry.  Psychiatric: He has a normal mood and affect. His behavior is normal. Judgment and thought content normal.    Vital signs in last 24 hours: @VSRANGES @  Labs:  Estimated body mass index is 33.86 kg/(m^2) as calculated from the following:   Height as of 08/12/15: 5\' 10"  (1.778 m).   Weight as of 10/27/15: 107.049 kg (236 lb).  Imaging Review Plain radiographs demonstrate cement spacer of the left knee(s). The overall alignment is neutral. The bone quality appears to be fair for age and reported activity level.  Assessment/Plan:  End stage arthritis, left knee(s) with failed previous arthroplasty.   The patient history, physical examination, clinical judgment of the provider and imaging studies are consistent with end stage degenerative joint disease of the left knee(s), previous total knee arthroplasty. Revision total knee  arthroplasty is deemed medically necessary. The treatment options including medical management, injection therapy, arthroscopy and revision arthroplasty were discussed at length. The risks and benefits of revision total knee arthroplasty were presented and reviewed. The risks due to aseptic loosening, infection, stiffness, patella tracking problems, thromboembolic complications and other imponderables were discussed. The patient acknowledged the explanation, agreed to proceed with the plan and consent was signed. Patient is being admitted for inpatient treatment for surgery, pain control, PT, OT, prophylactic antibiotics, VTE prophylaxis, progressive ambulation and ADL's and discharge planning.The patient is planning to be discharged home with home health services

## 2015-11-25 NOTE — Anesthesia Procedure Notes (Signed)
Procedure Name: Intubation Date/Time: 11/25/2015 11:26 AM Performed by: Collier Bullock Pre-anesthesia Checklist: Patient identified, Emergency Drugs available, Suction available and Patient being monitored Patient Re-evaluated:Patient Re-evaluated prior to inductionOxygen Delivery Method: Circle system utilized Preoxygenation: Pre-oxygenation with 100% oxygen Intubation Type: IV induction Laryngoscope Size: Mac and 3 Grade View: Grade II Tube type: Oral Number of attempts: 1 Airway Equipment and Method: Stylet Placement Confirmation: ETT inserted through vocal cords under direct vision,  positive ETCO2 and breath sounds checked- equal and bilateral Secured at: 23 cm Tube secured with: Tape Dental Injury: Teeth and Oropharynx as per pre-operative assessment

## 2015-11-25 NOTE — Progress Notes (Signed)
Orthopedic Tech Progress Note Patient Details:  Marc Maldonado 1946-06-03 [id number]  CPM Left Knee CPM Left Knee: On Left Knee Flexion (Degrees): 90 Left Knee Extension (Degrees): 0  Ortho Devices Ortho Device/Splint Location: applied ohf to bed, footsie roll Ortho Device/Splint Interventions: Ordered, Application   Braulio Bosch 11/25/2015, 3:29 PM

## 2015-11-25 NOTE — Transfer of Care (Signed)
Immediate Anesthesia Transfer of Care Note  Patient: Marc Maldonado  Procedure(s) Performed: Procedure(s): LEFT TOTAL KNEE REVISION (Left)  Patient Location: PACU  Anesthesia Type:General  Level of Consciousness: awake, alert  and patient cooperative  Airway & Oxygen Therapy: Patient Spontanous Breathing and Patient connected to face mask oxygen  Post-op Assessment: Report given to RN, Post -op Vital signs reviewed and stable, Patient moving all extremities X 4 and Patient able to stick tongue midline  Post vital signs: Reviewed and stable  Last Vitals:  Filed Vitals:   11/25/15 0931  BP: 149/88  Pulse: 48  Temp: 36.8 C  Resp: 20    Complications: No apparent anesthesia complications

## 2015-11-26 ENCOUNTER — Encounter (HOSPITAL_COMMUNITY): Payer: Self-pay | Admitting: Orthopedic Surgery

## 2015-11-26 LAB — CBC
HEMATOCRIT: 24.9 % — AB (ref 39.0–52.0)
HEMOGLOBIN: 8.6 g/dL — AB (ref 13.0–17.0)
MCH: 32.6 pg (ref 26.0–34.0)
MCHC: 34.5 g/dL (ref 30.0–36.0)
MCV: 94.3 fL (ref 78.0–100.0)
Platelets: 106 10*3/uL — ABNORMAL LOW (ref 150–400)
RBC: 2.64 MIL/uL — ABNORMAL LOW (ref 4.22–5.81)
RDW: 13.3 % (ref 11.5–15.5)
WBC: 8.9 10*3/uL (ref 4.0–10.5)

## 2015-11-26 LAB — BASIC METABOLIC PANEL
ANION GAP: 9 (ref 5–15)
BUN: 20 mg/dL (ref 6–20)
CALCIUM: 8.1 mg/dL — AB (ref 8.9–10.3)
CHLORIDE: 105 mmol/L (ref 101–111)
CO2: 26 mmol/L (ref 22–32)
Creatinine, Ser: 1.79 mg/dL — ABNORMAL HIGH (ref 0.61–1.24)
GFR calc Af Amer: 43 mL/min — ABNORMAL LOW (ref >60)
GFR calc non Af Amer: 37 mL/min — ABNORMAL LOW (ref >60)
GLUCOSE: 129 mg/dL — AB (ref 65–99)
Potassium: 4.2 mmol/L (ref 3.5–5.1)
Sodium: 140 mmol/L (ref 135–145)

## 2015-11-26 LAB — PREPARE RBC (CROSSMATCH)

## 2015-11-26 MED ORDER — SODIUM CHLORIDE 0.9 % IV SOLN: Freq: Once | INTRAVENOUS | Status: DC

## 2015-11-26 MED ORDER — ALUM & MAG HYDROXIDE-SIMETH 200-200-20 MG/5ML PO SUSP
30.0000 mL | Freq: Four times a day (QID) | ORAL | Status: DC | PRN
  Administered 2015-11-26: 30 mL via ORAL
  Filled 2015-11-26: qty 30

## 2015-11-26 MED FILL — Sodium Chloride Flush IV Soln 0.9%: INTRAVENOUS | Qty: 40 | Status: AC

## 2015-11-26 NOTE — Discharge Instructions (Signed)
INSTRUCTIONS AFTER JOINT REPLACEMENT   o Remove items at home which could result in a fall. This includes throw rugs or furniture in walking pathways o ICE to the affected joint every three hours while awake for 30 minutes at a time, for at least the first 3-5 days, and then as needed for pain and swelling.  Continue to use ice for pain and swelling. You may notice swelling that will progress down to the foot and ankle.  This is normal after surgery.  Elevate your leg when you are not up walking on it.   o Continue to use the breathing machine you got in the hospital (incentive spirometer) which will help keep your temperature down.  It is common for your temperature to cycle up and down following surgery, especially at night when you are not up moving around and exerting yourself.  The breathing machine keeps your lungs expanded and your temperature down.   DIET:  As you were doing prior to hospitalization, we recommend a well-balanced diet.  DRESSING / WOUND CARE / SHOWERING  You may change your dressing 3-5 days after surgery.  Then change the dressing every day with sterile gauze.  Please use good hand washing techniques before changing the dressing.  Do not use any lotions or creams on the incision until instructed by your surgeon. and You may shower 3 days after surgery, but keep the wounds dry during showering.  You may use an occlusive plastic wrap (Press'n Seal for example), NO SOAKING/SUBMERGING IN THE BATHTUB.  If the bandage gets wet, change with a clean dry gauze.  If the incision gets wet, pat the wound dry with a clean towel.  ACTIVITY  o Increase activity slowly as tolerated, but follow the weight bearing instructions below.   o No driving for 6 weeks or until further direction given by your physician.  You cannot drive while taking narcotics.  o No lifting or carrying greater than 10 lbs. until further directed by your surgeon. o Avoid periods of inactivity such as sitting longer  than an hour when not asleep. This helps prevent blood clots.  o You may return to work once you are authorized by your doctor.     WEIGHT BEARING   Weight bearing as tolerated with assist device (walker, cane, etc) as directed, use it as long as suggested by your surgeon or therapist, typically at least 4-6 weeks.   EXERCISES  Results after joint replacement surgery are often greatly improved when you follow the exercise, range of motion and muscle strengthening exercises prescribed by your doctor. Safety measures are also important to protect the joint from further injury. Any time any of these exercises cause you to have increased pain or swelling, decrease what you are doing until you are comfortable again and then slowly increase them. If you have problems or questions, call your caregiver or physical therapist for advice.   Rehabilitation is important following a joint replacement. After just a few days of immobilization, the muscles of the leg can become weakened and shrink (atrophy).  These exercises are designed to build up the tone and strength of the thigh and leg muscles and to improve motion. Often times heat used for twenty to thirty minutes before working out will loosen up your tissues and help with improving the range of motion but do not use heat for the first two weeks following surgery (sometimes heat can increase post-operative swelling).   These exercises can be done on a training (  exercise) mat, on the floor, on a table or on a bed. Use whatever works the best and is most comfortable for you.    Use music or television while you are exercising so that the exercises are a pleasant break in your day. This will make your life better with the exercises acting as a break in your routine that you can look forward to.   Perform all exercises about fifteen times, three times per day or as directed.  You should exercise both the operative leg and the other leg as well.  Exercises  include:    Quad Sets - Tighten up the muscle on the front of the thigh (Quad) and hold for 5-10 seconds.    Straight Leg Raises - With your knee straight (if you were given a brace, keep it on), lift the leg to 60 degrees, hold for 3 seconds, and slowly lower the leg.  Perform this exercise against resistance later as your leg gets stronger.   Leg Slides: Lying on your back, slowly slide your foot toward your buttocks, bending your knee up off the floor (only go as far as is comfortable). Then slowly slide your foot back down until your leg is flat on the floor again.   Angel Wings: Lying on your back spread your legs to the side as far apart as you can without causing discomfort.   Hamstring Strength:  Lying on your back, push your heel against the floor with your leg straight by tightening up the muscles of your buttocks.  Repeat, but this time bend your knee to a comfortable angle, and push your heel against the floor.  You may put a pillow under the heel to make it more comfortable if necessary.   A rehabilitation program following joint replacement surgery can speed recovery and prevent re-injury in the future due to weakened muscles. Contact your doctor or a physical therapist for more information on knee rehabilitation.    CONSTIPATION  Constipation is defined medically as fewer than three stools per week and severe constipation as less than one stool per week.  Even if you have a regular bowel pattern at home, your normal regimen is likely to be disrupted due to multiple reasons following surgery.  Combination of anesthesia, postoperative narcotics, change in appetite and fluid intake all can affect your bowels.   YOU MUST use at least one of the following options; they are listed in order of increasing strength to get the job done.  They are all available over the counter, and you may need to use some, POSSIBLY even all of these options:    Drink plenty of fluids (prune juice may be  helpful) and high fiber foods Colace 100 mg by mouth twice a day  Senokot for constipation as directed and as needed Dulcolax (bisacodyl), take with full glass of water  Miralax (polyethylene glycol) once or twice a day as needed.  If you have tried all these things and are unable to have a bowel movement in the first 3-4 days after surgery call either your surgeon or your primary doctor.    If you experience loose stools or diarrhea, hold the medications until you stool forms back up.  If your symptoms do not get better within 1 week or if they get worse, check with your doctor.  If you experience "the worst abdominal pain ever" or develop nausea or vomiting, please contact the office immediately for further recommendations for treatment.   ITCHING:  If  you experience itching with your medications, try taking only a single pain pill, or even half a pain pill at a time.  You can also use Benadryl over the counter for itching or also to help with sleep.   TED HOSE STOCKINGS:  Use stockings on both legs until for at least 2 weeks or as directed by physician office. They may be removed at night for sleeping.  MEDICATIONS:  See your medication summary on the After Visit Summary that nursing will review with you.  You may have some home medications which will be placed on hold until you complete the course of blood thinner medication.  It is important for you to complete the blood thinner medication as prescribed.  PRECAUTIONS:  If you experience chest pain or shortness of breath - call 911 immediately for transfer to the hospital emergency department.   If you develop a fever greater that 101 F, purulent drainage from wound, increased redness or drainage from wound, foul odor from the wound/dressing, or calf pain - CONTACT YOUR SURGEON.                                                   FOLLOW-UP APPOINTMENTS:  If you do not already have a post-op appointment, please call the office for an  appointment to be seen by your surgeon.  Guidelines for how soon to be seen are listed in your After Visit Summary, but are typically between 1-4 weeks after surgery.  OTHER INSTRUCTIONS:   Knee Replacement:  Do not place pillow under knee, focus on keeping the knee straight while resting. CPM instructions: 0-90 degrees, 2 hours in the morning, 2 hours in the afternoon, and 2 hours in the evening. Place foam block, curve side up under heel at all times except when in CPM or when walking.  DO NOT modify, tear, cut, or change the foam block in any way.  MAKE SURE YOU:   Understand these instructions.   Get help right away if you are not doing well or get worse.    Thank you for letting us be a part of your medical care team.  It is a privilege we respect greatly.  We hope these instructions will help you stay on track for a fast and full recovery!     Information on my medicine - ELIQUIS (apixaban)  This medication education was reviewed with me or my healthcare representative as part of my discharge preparation.    Why was Eliquis prescribed for you? Eliquis was prescribed for you to reduce the risk of blood clots forming after orthopedic surgery.    What do You need to know about Eliquis? Take your Eliquis 2.5 mg TWICE DAILY - one tablet in the morning and one tablet in the evening with or without food.  It would be best to take the dose about the same time each day.  If you have difficulty swallowing the tablet whole please discuss with your pharmacist how to take the medication safely.  Take Eliquis exactly as prescribed by your doctor and DO NOT stop taking Eliquis without talking to the doctor who prescribed the medication.  Stopping without other medication to take the place of Eliquis may increase your risk of developing a clot.  After discharge, you should have regular check-up appointments with your healthcare provider that is prescribing  your Eliquis.  What do you  do if you miss a dose? If a dose of ELIQUIS is not taken at the scheduled time, take it as soon as possible on the same day and twice-daily administration should be resumed.  The dose should not be doubled to make up for a missed dose.  Do not take more than one tablet of ELIQUIS at the same time.  Important Safety Information A possible side effect of Eliquis is bleeding. You should call your healthcare provider right away if you experience any of the following: ? Bleeding from an injury or your nose that does not stop. ? Unusual colored urine (red or dark brown) or unusual colored stools (red or black). ? Unusual bruising for unknown reasons. ? A serious fall or if you hit your head (even if there is no bleeding).  Some medicines may interact with Eliquis and might increase your risk of bleeding or clotting while on Eliquis. To help avoid this, consult your healthcare provider or pharmacist prior to using any new prescription or non-prescription medications, including herbals, vitamins, non-steroidal anti-inflammatory drugs (NSAIDs) and supplements.  This website has more information on Eliquis (apixaban): http://www.eliquis.com/eliquis/home

## 2015-11-26 NOTE — Evaluation (Signed)
Physical Therapy Evaluation Patient Details Name: Marc Maldonado MRN: [id number] DOB: 04/15/1946 Today's Date: 11/26/2015   History of Present Illness  70 y.o. male s/p L total knee revision. 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  Clinical Impression  Patient presents with problems listed below.  Will benefit from acute PT to maximize functional independence prior to discharge home with wife.  Patient limited today by anemia, orthostatic hypotension, nausea.    Follow Up Recommendations Home health PT;Supervision - Intermittent    Equipment Recommendations  None recommended by PT    Recommendations for Other Services       Precautions / Restrictions Precautions Precautions: Knee;Fall Precaution Booklet Issued: Yes (comment) Precaution Comments: Educated patient on knee precautions Restrictions Weight Bearing Restrictions: Yes LLE Weight Bearing: Weight bearing as tolerated      Mobility  Bed Mobility               General bed mobility comments: Patient unable to sit/stand with OT due to orthostatic BP, dizziness, and nausea/vomiting.  This pm patient reports he will receive blood.  Held mobility today.  Transfers                    Ambulation/Gait                Stairs            Wheelchair Mobility    Modified Rankin (Stroke Patients Only)       Balance                                             Pertinent Vitals/Pain Pain Assessment: 0-10 Pain Score: 4  Pain Location: Lt knee Pain Descriptors / Indicators: Aching Pain Intervention(s): Limited activity within patient's tolerance;Repositioned    Home Living Family/patient expects to be discharged to:: Private residence Living Arrangements: Spouse/significant other Available Help at Discharge: Family;Available PRN/intermittently (wife works - wife can be available for a few  days) Type of Home: House Home Access: Stairs to enter Entrance Stairs-Rails: None Entrance Stairs-Number of Steps: 5 Home Layout: Multi-level;Bed/bath upstairs (Bed/bath on 3rd floor) Home Equipment: Walker - 2 wheels;Cane - single point;Crutches;Bedside commode;Shower seat - built in Additional Comments: Wife works.  Patient reports wife not planning on time off, but could take a few days if needed.    Prior Function                 Hand Dominance   Dominant Hand: Right    Extremity/Trunk Assessment   Upper Extremity Assessment: Defer to OT evaluation           Lower Extremity Assessment: LLE deficits/detail   LLE Deficits / Details: Decreased strength and ROM due to pain/surgery.    Cervical / Trunk Assessment: Normal  Communication   Communication: No difficulties  Cognition Arousal/Alertness: Awake/alert Behavior During Therapy: WFL for tasks assessed/performed Overall Cognitive Status: Within Functional Limits for tasks assessed                      General Comments      Exercises Total Joint Exercises Ankle Circles/Pumps: AROM;Both;10 reps;Supine Quad Sets: AROM;Left;10 reps;Supine Towel Squeeze: AROM;Both;10 reps;Supine Short Arc Quad: AAROM;Left;10 reps;Supine Heel Slides: AAROM;Left;10 reps;Supine Hip ABduction/ADduction: AAROM;Left;10 reps;Supine  Assessment/Plan    PT Assessment Patient needs continued PT services  PT Diagnosis Difficulty walking;Acute pain   PT Problem List Decreased strength;Decreased range of motion;Decreased activity tolerance;Decreased balance;Decreased mobility;Decreased knowledge of use of DME;Decreased knowledge of precautions;Cardiopulmonary status limiting activity;Pain  PT Treatment Interventions DME instruction;Gait training;Stair training;Functional mobility training;Therapeutic activities;Therapeutic exercise;Patient/family education   PT Goals (Current goals can be found in the Care Plan section)  Acute Rehab PT Goals Patient Stated Goal: To be able to move around without getting sick PT Goal Formulation: With patient Time For Goal Achievement: 12/03/15 Potential to Achieve Goals: Good    Frequency 7X/week   Barriers to discharge Decreased caregiver support Wife works    Co-evaluation               End of Session   Activity Tolerance: Patient limited by fatigue;Patient limited by pain;Treatment limited secondary to medical complications (Comment) Patient left: in bed;with call bell/phone within reach           Time: [id number] PT Time Calculation (min) (ACUTE ONLY): 19 min   Charges:   PT Evaluation $PT Eval Moderate Complexity: 1 Procedure     PT G CodesDespina Pole 11/29/2015, 5:58 PM Carita Pian. Sanjuana Kava, [place] Pager [phone]

## 2015-11-26 NOTE — Care Management Note (Signed)
Case Management Note  Patient Details  Name: NICCOLAS STANSEL MRN: [id number] Date of Birth: [birth year]  Subjective/Objective:    70 yr old male s/p left total knee revision.                Action/Plan: Patient was preoperatively [facility] with [facility]    Expected Discharge Date:   11/27/15               Expected Discharge Plan:  [employer]  In-House Referral:  NA  Discharge planning Services  CM Consult  Post Acute Care Choice:  Home Health Choice offered to:  Patient  DME Arranged:  CPM DME Agency:  [employer]  HH Arranged:  PT HH Agency:  [facility]  Status of Service:  Completed, signed off  Medicare Important Message Given:    Date Medicare IM Given:    Medicare IM give by:    Date Additional Medicare IM Given:    Additional Medicare Important Message give by:     If discussed at [place] of Stay Meetings, dates discussed:    Additional Comments:  Ninfa Meeker, RN 11/26/2015, 12:23 PM

## 2015-11-26 NOTE — Progress Notes (Signed)
Occupational Therapy Evaluation Patient Details Name: Marc Maldonado MRN: [id number] DOB: 1946-02-23 Today's Date: 11/26/2015    History of Present Illness 70 y.o. male s/p L total knee revision. 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   Clinical Impression   Occupational Therapy evaluation limited by pt's low BP, dizziness, and nausea/vomiting. PTA, pt needed assistance with ADLs and mobility due to immobilization after spacer placed 3 months ago. Pt currently requires min assist for bed mobility and became dizzy, nauseated, and vomited sitting EOB with relief afterwards. Attempted sit-stand transfer but pt reported severe dizziness and BP was 89/46. Laid pt back down and BP was 85/44, pulse 70, and SpO2 89-99% with cues for breathing techniques. Pt would benefit from continued acute OT to increase independence and safety with ADLs and mobility to allow for safe discharge home. Recommending [facility] for ADL retraining and safety evaluation.     Follow Up Recommendations  Home health OT;Supervision/Assistance - 24 hour    Equipment Recommendations  None recommended by OT    Recommendations for Other Services       Precautions / Restrictions Precautions Precautions: Knee;Fall Precaution Booklet Issued: No Precaution Comments: Educated pt to not place pillow or other objects under L knee Restrictions Weight Bearing Restrictions: Yes LLE Weight Bearing: Weight bearing as tolerated      Mobility Bed Mobility Overal bed mobility: Needs Assistance Bed Mobility: Supine to Sit;Sit to Supine     Supine to sit: Min assist;HOB elevated Sit to supine: Min assist;HOB elevated   General bed mobility comments: Slight min assist to elevate/lower LLE on and off bed. Pt hooking R foot underneath L ankle to move it. Pt reported increased dizziness sitting EOB that began to subside after 2 minutes, but returned again after  another 2 minutes. Pt became nauseus and vomited sitting EOB - but reported relief from nausea and dizziness afterwards.  Transfers Overall transfer level: Needs assistance Equipment used: Rolling walker (2 wheeled) Transfers: Sit to/from Stand Sit to Stand: Min guard         General transfer comment: Attempted sit-stand transfer with min guard assist, but pt reported immediate severe dizziness. Returned sitting EOB - BP 89/46. Laid pt back down after 2 minutes BP 87/42 and Sp02 91%. After another 2 minutes BP 85/44 and Sp02 ranging from 89-99 with verbal cues for breathing techniques.     Balance Overall balance assessment: Needs assistance Sitting-balance support: No upper extremity supported;Feet supported Sitting balance-Leahy Scale: Fair     Standing balance support: During functional activity;Bilateral upper extremity supported Standing balance-Leahy Scale: Poor                              ADL Overall ADL's : Needs assistance/impaired                                       General ADL Comments: Pt's low BP, dizziness, nausea and vomiting limited session.     Vision Vision Assessment?: No apparent visual deficits   Perception     Praxis      Pertinent Vitals/Pain Pain Assessment: 0-10 Pain Score: 2  Pain Location: L knee Pain Descriptors / Indicators: Aching Pain Intervention(s): Limited activity within patient's tolerance;Monitored during session;Premedicated before session;Repositioned     Hand Dominance Right  Extremity/Trunk Assessment Upper Extremity Assessment Upper Extremity Assessment: Generalized weakness   Lower Extremity Assessment Lower Extremity Assessment: LLE deficits/detail LLE Deficits / Details: decreased AROM and strength as expected post op   Cervical / Trunk Assessment Cervical / Trunk Assessment: Normal   Communication Communication Communication: No difficulties   Cognition Arousal/Alertness:  Lethargic;Suspect due to medications Behavior During Therapy: [facility] for tasks assessed/performed Overall Cognitive Status: Within Functional Limits for tasks assessed                     General Comments       Exercises       Shoulder Instructions      Home Living Family/patient expects to be discharged to:: Private residence Living Arrangements: Spouse/significant other Available Help at Discharge: Family;Available PRN/intermittently Type of Home: House Home Access: Stairs to enter Entrance Stairs-Number of Steps: 5 Entrance Stairs-Rails: None Home Layout: Multi-level (bedroom/bathroom on 3rd floor) Alternate Level Stairs-Number of Steps: 12 (12 to get to 2nd floor, 12 to get to 3rd floor)   Bathroom Shower/Tub: Walk-in shower;Door   [employer] Toilet: Standard Bathroom Accessibility: Yes How Accessible: Accessible via walker Home Equipment: Walker - 2 wheels;Cane - single point;Crutches;Bedside commode;Shower seat - built in   Additional Comments: wife works from 7-2:30pm and will not be taking any time off when pt d/c.      Prior Functioning/Environment Level of Independence: Needs assistance        Comments: Has been in traction for past 3 months    OT Diagnosis: Generalized weakness;Acute pain   OT Problem List: Decreased strength;Decreased range of motion;Decreased activity tolerance;Impaired balance (sitting and/or standing);Decreased coordination;Decreased safety awareness;Decreased knowledge of use of DME or AE;Decreased knowledge of precautions;Pain   OT Treatment/Interventions: Self-care/ADL training;Therapeutic exercise;DME and/or AE instruction;Energy conservation;Therapeutic activities;Patient/family education;Balance training    OT Goals(Current goals can be found in the care plan section) Acute Rehab OT Goals Patient Stated Goal: to be able to bend knee OT Goal Formulation: With patient Time For Goal Achievement: 12/10/15 Potential to Achieve  Goals: Good ADL Goals Pt Will Perform Grooming: with supervision;standing Pt Will Perform Lower Body Bathing: with supervision;sit to/from stand;with adaptive equipment Pt Will Perform Lower Body Dressing: with supervision;with adaptive equipment;sit to/from stand Pt Will Transfer to Toilet: with supervision;ambulating;bedside commode (with BSC over toilet) Pt Will Perform Toileting - Clothing Manipulation and hygiene: with supervision;sit to/from stand Pt Will Perform Tub/Shower Transfer: Shower transfer;ambulating;with supervision;shower seat;rolling walker  OT Frequency: Min 2X/week   Barriers to D/C: Decreased caregiver support  Wife works during the day. Has no other family or friends available to assist       Co-evaluation              End of Session Equipment Utilized During Treatment: Gait belt;Rolling walker CPM Left Knee CPM Left Knee: On Left Knee Flexion (Degrees): 60 Left Knee Extension (Degrees): -2 Nurse Communication: Mobility status;Other (comment) (low BP, vomited)  Activity Tolerance: Patient limited by lethargy (nausea and dizziness) Patient left: in bed;with call bell/phone within reach;with SCD's reapplied   Time: [id number] OT Time Calculation (min): 40 min Charges:  OT General Charges $OT Visit: 1 Procedure OT Evaluation $OT Eval Moderate Complexity: 1 Procedure OT Treatments $Self Care/Home Management : 23-37 mins G-Codes:    Redmond Baseman, OTR/L [phone] 11/26/2015, 12:28 PM

## 2015-11-26 NOTE — Progress Notes (Signed)
PT Cancellation Note  Patient Details Name: Marc Maldonado MRN: [id number] DOB: Mar 04, 1946   Cancelled Treatment:    Reason Eval/Treat Not Completed: Medical issues which prohibited therapy.  Attempted to work with patient this am.  Was with OT who reports patient is orthostatic with BP 89/46, was dizzy and nauseated/ vomiting.  Will return this pm to attempt PT eval.   Despina Pole 11/26/2015, 11:58 AM Carita Pian. Sanjuana Kava, [place] Pager [phone]

## 2015-11-26 NOTE — Progress Notes (Signed)
PA made aware this morning that patient is DTV, but last bladder scan had small volume.  Bladder rescan only showed 151 ml at 1230.  Patient encouraged to remain hydrated.  Will continue to ask hourly if assistance is needed.  Will rescan bladder prior to shift change if no void has occurred to determine if intermittent cath is needed despite low volume.  Patient voided 100 at 1800, repeat bladder scan only showed 97.  Will continue to monitor.

## 2015-11-26 NOTE — Progress Notes (Signed)
Subjective: 1 Day Post-Op Procedure(s) (LRB): LEFT TOTAL KNEE REVISION (Left) Patient reports pain as mild.  No nausea but has vomited.  No lightheadedness/dizziness, chest pain/sob.  Patient appears a little too sedated this am.    Objective: Vital signs in last 24 hours: Temp:  [97.6 F (36.4 C)-98.3 F (36.8 C)] 97.9 F (36.6 C) (02/02 0549) Pulse Rate:  [48-75] 71 (02/02 0549) Resp:  [9-20] 16 (02/02 0549) BP: (91-149)/(48-88) 91/54 mmHg (02/02 0549) SpO2:  [96 %-100 %] 96 % (02/02 0549) Weight:  [109.09 kg (240 lb 8 oz)] 109.09 kg (240 lb 8 oz) (02/01 0931)  Intake/Output from previous day: 02/01 0701 - 02/02 0700 In: 5083.3 [P.O.:240; I.V.:4643.3; IV Piggyback:200] Out: 900 [Urine:850; Blood:50] Intake/Output this shift:     Recent Labs  11/26/15 0527  HGB 8.6*    Recent Labs  11/26/15 0527  WBC 8.9  RBC 2.64*  HCT 24.9*  PLT PENDING    Recent Labs  11/26/15 0527  NA 140  K 4.2  CL 105  CO2 26  BUN 20  CREATININE 1.79*  GLUCOSE 129*  CALCIUM 8.1*   No results for input(s): LABPT, INR in the last 72 hours.  Neurologically intact Neurovascular intact Sensation intact distally Intact pulses distally Dorsiflexion/Plantar flexion intact Incision: moderate drainage No cellulitis present Compartment soft  Assessment/Plan: 1 Day Post-Op Procedure(s) (LRB): LEFT TOTAL KNEE REVISION (Left) Advance diet Up with therapy  Dry dressing change prn ABLA-stable but will continue to monitor D/C oxycontin due to sedation WBAT LLE Likely d/c tom with hhpt  Fannie Knee 11/26/2015, 7:32 AM

## 2015-11-27 ENCOUNTER — Encounter (HOSPITAL_COMMUNITY): Payer: Self-pay | Admitting: Orthopedic Surgery

## 2015-11-27 LAB — BASIC METABOLIC PANEL
Anion gap: 9 (ref 5–15)
BUN: 27 mg/dL — AB (ref 6–20)
CO2: 29 mmol/L (ref 22–32)
Calcium: 8.2 mg/dL — ABNORMAL LOW (ref 8.9–10.3)
Chloride: 101 mmol/L (ref 101–111)
Creatinine, Ser: 2.03 mg/dL — ABNORMAL HIGH (ref 0.61–1.24)
GFR calc Af Amer: 37 mL/min — ABNORMAL LOW (ref >60)
GFR, EST NON AFRICAN AMERICAN: 32 mL/min — AB (ref >60)
GLUCOSE: 111 mg/dL — AB (ref 65–99)
POTASSIUM: 3.7 mmol/L (ref 3.5–5.1)
Sodium: 139 mmol/L (ref 135–145)

## 2015-11-27 LAB — TYPE AND SCREEN
ABO/RH(D): A POS
ANTIBODY SCREEN: NEGATIVE
UNIT DIVISION: 0
Unit division: 0

## 2015-11-27 LAB — CBC
HEMATOCRIT: 26.2 % — AB (ref 39.0–52.0)
HEMOGLOBIN: 9.2 g/dL — AB (ref 13.0–17.0)
MCH: 32.2 pg (ref 26.0–34.0)
MCHC: 35.1 g/dL (ref 30.0–36.0)
MCV: 91.6 fL (ref 78.0–100.0)
PLATELETS: 93 10*3/uL — AB (ref 150–400)
RBC: 2.86 MIL/uL — ABNORMAL LOW (ref 4.22–5.81)
RDW: 14.5 % (ref 11.5–15.5)
WBC: 7.3 10*3/uL (ref 4.0–10.5)

## 2015-11-27 NOTE — Progress Notes (Signed)
Physical Therapy Treatment Patient Details Name: Marc Maldonado MRN: [id number] DOB: 1946-01-17 Today's Date: 11/27/2015    History of Present Illness 70 y.o. male s/p L total knee revision. 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    PT Comments    Returned this pm to complete exercise program.  Patient able to do SLR with LLE today.  Pain decreased.  Progressing well.  Follow Up Recommendations  Home health PT;Supervision - Intermittent     Equipment Recommendations  None recommended by PT    Recommendations for Other Services       Precautions / Restrictions Precautions Precautions: Knee;Fall Restrictions Weight Bearing Restrictions: Yes LLE Weight Bearing: Weight bearing as tolerated    Mobility  Bed Mobility Overal bed mobility: Needs Assistance Bed Mobility: Supine to Sit;Sit to Supine     Supine to sit: Supervision Sit to supine: Supervision   General bed mobility comments: No physical assist needed.  Patient able to use RLE to assist LLE off of and onto bed during transitions.  Supervision for safety only.  Transfers Overall transfer level: Needs assistance Equipment used: Rolling walker (2 wheeled) Transfers: Sit to/from Stand Sit to Stand: Min guard         General transfer comment: Assist for safety only.  No c/o dizziness in sitting or standing.  Ambulation/Gait Ambulation/Gait assistance: Min guard Ambulation Distance (Feet): 62 Feet Assistive device: Rolling walker (2 wheeled) Gait Pattern/deviations: Step-to pattern;Decreased stance time - left;Decreased step length - right;Decreased stride length;Decreased weight shift to left;Antalgic Gait velocity: decreased Gait velocity interpretation: Below normal speed for age/gender General Gait Details: Verbal cues for safe use of RW and gait sequence.  Slow, steady gait.  Cues to look up during gait.   Stairs             Wheelchair Mobility    Modified Rankin (Stroke Patients Only)       Balance                                    Cognition Arousal/Alertness: Awake/alert Behavior During Therapy: WFL for tasks assessed/performed Overall Cognitive Status: Within Functional Limits for tasks assessed                      Exercises Total Joint Exercises Ankle Circles/Pumps: AROM;Both;10 reps;Supine Quad Sets: AROM;Left;10 reps;Supine Short Arc Quad: Left;10 reps;Supine;AROM Heel Slides: Left;10 reps;Supine;AROM Hip ABduction/ADduction: Left;10 reps;Supine;AROM Straight Leg Raises: AROM;Left;5 reps;Supine    General Comments General comments (skin integrity, edema, etc.): At end of gait training, noted patient's Lt knee bleeding from under bandage.  Returned to supine.  RN called to room to address.      Pertinent Vitals/Pain Pain Assessment: 0-10 Pain Score: 3  Pain Location: Lt knee Pain Descriptors / Indicators: Sore Pain Intervention(s): Monitored during session;Repositioned    Home Living                      Prior Function            PT Goals (current goals can now be found in the care plan section) Progress towards PT goals: Progressing toward goals    Frequency  7X/week    PT Plan Current plan remains appropriate    Co-evaluation  End of Session Equipment Utilized During Treatment: Gait belt Activity Tolerance: Patient tolerated treatment well Patient left: in bed;with call bell/phone within reach     Time: 1712-1721 PT Time Calculation (min) (ACUTE ONLY): 9 min  Charges:  $Gait Training: 8-22 mins $Therapeutic Exercise: 8-22 mins                    G Codes:      Despina Pole 12/11/2015, 6:02 PM Carita Pian. Sanjuana Kava, [place] Pager [phone]

## 2015-11-27 NOTE — Op Note (Signed)
NAMEARMOND, MUSSETT NO.:  [id number]  MEDICAL RECORD NO.:  [id number]  LOCATION:  5N08C                        FACILITY:  [facility]  PHYSICIAN:  [name], M.D. 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  Subsequent hematogenous based infection with septic joint.  Status post removal of implants and placement of an antibiotic impregnated cement spacer on both the tibia and femur.  Now with resolution of infection.  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  Subsequent hematogenous based infection with septic joint.  Status post removal of implants and placement of an antibiotic impregnated cement spacer on both the tibia and femur.  Now with resolution of infection, with extensive intra and extra-articular arthrofibrosis put reasonable bone stock.  PROCEDURE:  Left knee aspiration and extensive debridement, lysis debridement of adhesions and scar tissue.  Removal of antibiotic impregnated cement spacer, tibia and femur.  Revision to total knee replacement, utilizing Stryker prosthesis.  A cemented total stabilized #7 femoral component with 18 x 100 mm stem.  A cemented #6 tibial component with 10 mm medial and lateral metallic augments.  A 15 x 100 mm stem.  A 22 mm TS polyethylene insert.  Cemented resurfacing 38-mm patellar component.  SURGEON:  [name], M.D.  ASSISTANT:  Elmyra Ricks, PA, present throughout the entire case and necessary for timely completion of procedure.  ANESTHESIA:  General.  BLOOD LOSS:  Minimal.  SPECIMENS:  None.  CULTURES:  None.  COMPLICATIONS:  None.  DRESSINGS:  Soft compressive knee immobilizer.  TOURNIQUET TIME:  2 hours.  DESCRIPTION OF  PROCEDURE:  The patient was brought to the operating room, placed on the operating table in supine position.  After adequate anesthesia had been obtained, tourniquet applied.  Prepped and draped in usual sterile fashion.  Exsanguinated with elevation of Esmarch. Tourniquet inflated to 350 mmHg.  Motion 0 to about 30 degrees.  I used his previous anterior incision.  Skin and subcutaneous tissue divided. Extensive adhesions in the soft tissue debrided.  Medial arthrotomy, vastus splitting, and preserving quad tendon.  Knee exposed.  Fair amount of clear fluid, but no evidence of infection.  Extensive adhesions throughout.  I re-established the superior recess as well as the medial and lateral gutters.  The previous cement inserters were then removed from the femur and tibia.  Extensive debridement of soft tissue. I then used an intramedullary guide fixation, bringing the femur up to good sizing and fitting for an 18 mm stem.  Using that stem, I did not distal cut at 5 degrees of valgus, again not having to remove much bone on the femur.  Most of the bone loss was on the tibia.  Using the epicondylar axis and appropriate instrumentation, the femur was then sized, cut, and fitted for #7 TS stemmed component, with an 18 x 100 mm intramedullary stem.  Extramedullary guide on the tibia just to a freshening cut.  I then used  an intramedullary instrumentation bringing it up to good fitting with a 15 x 100 mm stem.  off that, I used the tibial guide and then made the final cut at going down to reasonable good bone.  Copious irrigation throughout.  The tibia was sized for a #6 component.  I assembled the trials with the stems on the tibia and femur.  With the 10 mm augments and a 22 mm insert, I was able to get a balanced knee.  Good bite mechanical axis, with full flexion, full extension, good alignment, and good stability.  I freshened the back of the patella, drill sized and fitted with a 38 mm  component.  Good patellar tracking confirmed.  Tibia had been marked for rotation and hand reamed.  All trials were removed.  Copious irrigation with a pulse irrigating device.  An antibiotic impregnated cement was then prepared and the femoral and tibial components were firmly cemented with the augments placed on the bottom of the tibial component and the stems as described above attached.  Patellar component was cemented in place. Held with a clamp.  Once cement hardened, the knee was irrigated again. Soft tissues injected with Exparel.  I then went through trials again elected the 22 mm TS polyethylene insert.  This was insert on the tibia component.  They placed all this in place.  I reinforced it, it was inserted through the polyethylene.  With that final construct, I was again pleased with mechanical axis, motion stability, and tracking. Soft tissues had been injected with Exparel.  The wound was irrigated once again.  Arthrotomy closed with Vicryl.  Subcutaneous and subcuticular closure.  Margins were injected with Marcaine.  Sterile compressive dressing applied.  Tourniquet deflated and removed.  Knee immobilizer applied.  Anesthesia reversed.  Brought to the recovery room.  Tolerated the surgery well.  No complications.     [name], M.D.     DFM/MEDQ  D:  11/26/2015  T:  11/26/2015  Job:  [id number]

## 2015-11-27 NOTE — Progress Notes (Signed)
Subjective: 2 Days Post-Op Procedure(s) (LRB): LEFT TOTAL Maldonado REVISION (Left) Patient reports pain as mild.  No nausea/vomiting, lightheadedness/dizziness, chest pain/sob today.  Patient feeling much better after getting 2 units of blood yesterday.  Negative flatus/bm.   Objective: Vital signs in last 24 hours: Temp:  [98 F (36.7 C)-98.9 F (37.2 C)] 98.2 F (36.8 C) (02/03 0500) Pulse Rate:  [60-91] 84 (02/03 0500) Resp:  [16-18] 18 (02/03 0500) BP: (91-128)/(46-70) 122/70 mmHg (02/03 0500) SpO2:  [91 %-97 %] 94 % (02/03 0500)  Intake/Output from previous day: 02/02 0701 - 02/03 0700 In: 1030 [P.O.:360; Blood:670] Out: 475 [Urine:475] Intake/Output this shift:     Recent Labs  11/26/15 0527 11/27/15 0510  HGB 8.6* 9.2*    Recent Labs  11/26/15 0527 11/27/15 0510  WBC 8.9 7.3  RBC 2.64* 2.86*  HCT 24.9* 26.2*  PLT 106* 93*    Recent Labs  11/26/15 0527 11/27/15 0510  NA 140 139  K 4.2 3.7  CL 105 101  CO2 26 29  BUN 20 27*  CREATININE 1.79* 2.03*  GLUCOSE 129* 111*  CALCIUM 8.1* 8.2*   No results for input(s): LABPT, INR in the last 72 hours.  Neurologically intact Neurovascular intact Sensation intact distally Intact pulses distally Dorsiflexion/Plantar flexion intact Compartment soft  Assessment/Plan: 2 Days Post-Op Procedure(s) (LRB): LEFT TOTAL Maldonado REVISION (Left) Advance diet Up with therapy Plan for discharge tomorrow with hhpt WBAT LLE Please place new aquacel over wound Continue fluids due to decreased renal function ABLA- 2 units of prbc given yesterday   Marc Maldonado 11/27/2015, 7:59 AM

## 2015-11-27 NOTE — Care Management Important Message (Signed)
Important Message  Patient Details  Name: Marc Maldonado MRN: [id number] Date of Birth: Oct 15, 1946   Medicare Important Message Given:  Yes    Barb Merino Brekyn [place] 11/27/2015, 4:17 PM

## 2015-11-27 NOTE — Progress Notes (Signed)
Physical Therapy Progress Note  Clinical Impressions:  Patient progressing with ambulation today.  Able to ambulate 12' with RW and min guard assist.  Noted bleeding from Lt knee following gait training.  RN called to room to address.  Will return later pm for exercises.   11/27/15 1423  PT Visit Information  Last PT Received On 11/27/15  Assistance Needed +1  History of Present Illness 70 y.o. male s/p L total knee revision. 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  PT Time Calculation  PT Start Time (ACUTE ONLY) 1403  PT Stop Time (ACUTE ONLY) 1421  PT Time Calculation (min) (ACUTE ONLY) 18 min  Subjective Data  Subjective Patient reports numbness on Rt side is getting better.  Precautions  Precautions Knee;Fall  Restrictions  Weight Bearing Restrictions Yes  LLE Weight Bearing WBAT  Pain Assessment  Pain Assessment 0-10  Pain Score 2  Pain Location Lt knee  Pain Descriptors / Indicators Sore  Pain Intervention(s) Monitored during session;Repositioned  Cognition  Arousal/Alertness Awake/alert  Behavior During Therapy WFL for tasks assessed/performed  Overall Cognitive Status Within Functional Limits for tasks assessed  Bed Mobility  Overal bed mobility Needs Assistance  Bed Mobility Supine to Sit;Sit to Supine  Supine to sit Supervision  Sit to supine Supervision  General bed mobility comments No physical assist needed.  Patient able to use RLE to assist LLE off of and onto bed during transitions.  Supervision for safety only.  Transfers  Overall transfer level Needs assistance  Equipment used Rolling walker (2 wheeled)  Transfers Sit to/from Stand  Sit to Stand Min guard  General transfer comment Assist for safety only.  No c/o dizziness in sitting or standing.  Ambulation/Gait  Ambulation/Gait assistance Min guard  Ambulation Distance (Feet) 62 Feet  Assistive device Rolling walker (2 wheeled)   Gait Pattern/deviations Step-to pattern;Decreased stance time - left;Decreased step length - right;Decreased stride length;Decreased weight shift to left;Antalgic  General Gait Details Verbal cues for safe use of RW and gait sequence.  Slow, steady gait.  Cues to look up during gait.  Gait velocity decreased  Gait velocity interpretation Below normal speed for age/gender  General Comments  General comments (skin integrity, edema, etc.) At end of gait training, noted patient's Lt knee bleeding from under bandage.  Returned to supine.  RN called to room to address.  PT - End of Session  Equipment Utilized During Treatment Gait belt  Activity Tolerance Patient limited by fatigue  Patient left in bed;with call bell/phone within reach  Nurse Communication Other (comment) (Bleeding from Lt knee )  PT - Assessment/Plan  PT Plan Current plan remains appropriate  PT Frequency (ACUTE ONLY) 7X/week  Follow Up Recommendations Home health PT;Supervision - Intermittent  PT equipment None recommended by PT  PT Goal Progression  Progress towards PT goals Progressing toward goals  PT General Charges  $$ ACUTE PT VISIT 1 Procedure  PT Treatments  $Gait Training 8-22 mins  Carita Pian. Sanjuana Kava, [place] Pager [phone]

## 2015-11-27 NOTE — Progress Notes (Signed)
ON shift report; night nurse informed this RN that pt was complaining of numbness in RLE since the placement of TED hose on 11/26/15. Night nurse intervention was to remove TED hose. On this RN assessment this am pt co numbness in RLE that was slowly alleviating. At 12pm. Physical therapy came to work with pt and pt reported numbness to RUE and was unable to provide time of onset. This RN completed an NIHSS assessment. Pt denied blurry vision nor headache; had no facial drooping, aphasia; nor weakness or drift in RUE and RLE. RN will continue to monitor with neurovascular checks

## 2015-11-27 NOTE — Progress Notes (Signed)
Occupational Therapy Treatment Patient Details Name: Marc Maldonado MRN: [id number] DOB: Oct 22, 1946 Today's Date: 11/27/2015    History of present illness 70 y.o. male s/p L total knee revision. 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   OT comments  Pt. Reports dizziness has improved but still limited by fatigue and nausea.  Able to complete toileting task with min a, along with bed mobility min a.  Plan to address LB ADLS next session.    Follow Up Recommendations  Home health OT;Supervision/Assistance - 24 hour    Equipment Recommendations  None recommended by OT    Recommendations for Other Services      Precautions / Restrictions Precautions Precautions: Knee;Fall Restrictions LLE Weight Bearing: Weight bearing as tolerated       Mobility Bed Mobility Overal bed mobility: Needs Assistance Bed Mobility: Supine to Sit     Supine to sit: Min guard     General bed mobility comments: hob flat, no rails for home env. pt. able to complete bed mobility and bring b les towards eob with no physical assist  Transfers Overall transfer level: Needs assistance Equipment used: Rolling walker (2 wheeled) Transfers: Sit to/from [facility] Sit to Stand: Min guard Stand pivot transfers: Min guard       General transfer comment: cues for sequencing and hand placement, also on sliding LLE forward when transitioning into sitting    Balance                                   ADL Overall ADL's : Needs assistance/impaired     Grooming: Wash/dry face;Sitting;Set up                   Toilet Transfer: Minimal assistance;RW;Ambulation;Regular Toilet;BSC;Grab bars   Toileting- Clothing Manipulation and Hygiene: Sitting/lateral lean;Min guard Toileting - Clothing Manipulation Details (indicate cue type and reason): simulated, pt. did not have to actually use the b.room     Functional mobility during ADLs: Minimal assistance General ADL Comments: pt. reports BP is improved, no c/o dizziness but nausea continues.        Vision                     Perception     Praxis      Cognition   Behavior During Therapy: WFL for tasks assessed/performed Overall Cognitive Status: Within Functional Limits for tasks assessed                       Extremity/Trunk Assessment               Exercises     Shoulder Instructions       General Comments      Pertinent Vitals/ Pain       Pain Assessment: 0-10 Pain Score: 2  (2 before activity but then it progressed with activity) Pain Descriptors / Indicators: Aching Pain Intervention(s): Limited activity within patient's tolerance;Repositioned;Patient requesting pain meds-RN notified  Home Living                                          Prior Functioning/Environment              Frequency Min  2X/week     Progress Toward Goals  OT Goals(current goals can now be found in the care plan section)  Progress towards OT goals: Progressing toward goals     Plan Discharge plan remains appropriate    Co-evaluation                 End of Session Equipment Utilized During Treatment: Gait belt;Rolling walker   Activity Tolerance Patient limited by fatigue;Patient limited by pain   Patient Left in chair;with call bell/phone within reach   Nurse Communication Patient requests pain meds        Time: 0940-1003 OT Time Calculation (min): 23 min  Charges: OT General Charges $OT Visit: 1 Procedure OT Treatments $Self Care/Home Management : 23-37 mins  Janice Coffin, COTA/L 11/27/2015, 10:14 AM

## 2015-11-27 NOTE — Progress Notes (Signed)
Physical Therapy Treatment Patient Details Name: Marc Maldonado MRN: [id number] DOB: [birth year] Today's Date: 18-Dec-2015    History of Present Illness 70 y.o. male s/p L total knee revision. 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    PT Comments    Patient fatigued this am following OOB and OT.  Did agree to perform exercise program.  Patient c/o Rt hand and LE pins and needles feeling.  RN notified - to room to assess.  Will continue PT this pm.  Follow Up Recommendations  Home health PT;Supervision - Intermittent     Equipment Recommendations  None recommended by PT    Recommendations for Other Services       Precautions / Restrictions Precautions Precautions: Knee;Fall Precaution Comments: Reviewed precautions Restrictions Weight Bearing Restrictions: Yes LLE Weight Bearing: Weight bearing as tolerated    Mobility  Bed Mobility               General bed mobility comments: Patient declined mobility - just returned to bed.  Transfers                    Ambulation/Gait                 Stairs            Wheelchair Mobility    Modified Rankin (Stroke Patients Only)       Balance                                    Cognition Arousal/Alertness: Awake/alert Behavior During Therapy: WFL for tasks assessed/performed Overall Cognitive Status: Within Functional Limits for tasks assessed                      Exercises Total Joint Exercises Ankle Circles/Pumps: AROM;Both;10 reps;Supine Quad Sets: AROM;Left;10 reps;Supine Towel Squeeze: AROM;Both;10 reps;Supine Short Arc Quad: Left;10 reps;Supine;AROM Heel Slides: Left;10 reps;Supine;AROM Hip ABduction/ADduction: Left;10 reps;Supine;AROM Straight Leg Raises: AROM;Left;Supine (2 reps)    General Comments General comments (skin integrity, edema, etc.): Reported Rt hand and RLE were numb.  Feels like pins and needles.  RN called to room to assess.      Pertinent Vitals/Pain Pain Assessment: 0-10 Pain Score: 3  Pain Location: Lt knee Pain Descriptors / Indicators: Sore Pain Intervention(s): Monitored during session;Repositioned    Home Living                      Prior Function            PT Goals (current goals can now be found in the care plan section) Progress towards PT goals: Progressing toward goals    Frequency  7X/week    PT Plan Current plan remains appropriate    Co-evaluation             End of Session   Activity Tolerance: Patient limited by fatigue Patient left: in bed;with call bell/phone within reach;with family/visitor present     Time: [id number] PT Time Calculation (min) (ACUTE ONLY): 26 min  Charges:  $Therapeutic Exercise: 23-37 mins                    G Codes:      Despina Pole December 18, 2015, 3:30 PM Carita Pian. Sanjuana Kava, [place] Pager [phone]

## 2015-11-28 LAB — BASIC METABOLIC PANEL
ANION GAP: 8 (ref 5–15)
BUN: 18 mg/dL (ref 6–20)
CO2: 28 mmol/L (ref 22–32)
Calcium: 8.5 mg/dL — ABNORMAL LOW (ref 8.9–10.3)
Chloride: 103 mmol/L (ref 101–111)
Creatinine, Ser: 1.26 mg/dL — ABNORMAL HIGH (ref 0.61–1.24)
GFR calc Af Amer: >60 mL/min (ref >60)
GFR calc non Af Amer: 57 mL/min — ABNORMAL LOW (ref >60)
GLUCOSE: 111 mg/dL — AB (ref 65–99)
POTASSIUM: 3.4 mmol/L — AB (ref 3.5–5.1)
Sodium: 139 mmol/L (ref 135–145)

## 2015-11-28 LAB — CBC
HEMATOCRIT: 24.7 % — AB (ref 39.0–52.0)
Hemoglobin: 8.7 g/dL — ABNORMAL LOW (ref 13.0–17.0)
MCH: 32 pg (ref 26.0–34.0)
MCHC: 35.2 g/dL (ref 30.0–36.0)
MCV: 90.8 fL (ref 78.0–100.0)
Platelets: 102 10*3/uL — ABNORMAL LOW (ref 150–400)
RBC: 2.72 MIL/uL — AB (ref 4.22–5.81)
RDW: 14.2 % (ref 11.5–15.5)
WBC: 5.8 10*3/uL (ref 4.0–10.5)

## 2015-11-28 NOTE — Progress Notes (Signed)
[facility] Progress Note  Subjective  Doing well Ready to go home Has worked with PT already  No specific complaints  ROS As above   Objective   BP 135/74 mmHg  Pulse 66  Temp(Src) 99 F (37.2 C) (Oral)  Resp 18  Ht '5\' 10"'$  (1.778 m)  Wt 109.09 kg (240 lb 8 oz)  BMI 34.51 kg/m2  SpO2 98%  Intake/Output      02/03 0701 - 02/04 0700 02/04 0701 - 02/05 0700   P.O. 480    Blood     Total Intake(mL/kg) 480 (4.4)    Urine (mL/kg/hr) 1100 (0.4)    Emesis/NG output     Total Output 1100     Net -620          Urine Occurrence 3 x      Labs  Results for KHALIB, FENDLEY (MRN [id number]) as of 11/28/2015 11:40  Ref. Range 11/28/2015 05:29  Sodium Latest Ref Range: 135-145 mmol/L 139  Potassium Latest Ref Range: 3.5-5.1 mmol/L 3.4 (L)  Chloride Latest Ref Range: 101-111 mmol/L 103  CO2 Latest Ref Range: 22-32 mmol/L 28  BUN Latest Ref Range: 6-20 mg/dL 18  Creatinine Latest Ref Range: 0.61-1.24 mg/dL 1.26 (H)  Calcium Latest Ref Range: 8.9-10.3 mg/dL 8.5 (L)  EGFR (Non-African Amer.) Latest Ref Range: >60 mL/min 57 (L)  EGFR (African American) Latest Ref Range: >60 mL/min >60  Glucose Latest Ref Range: 65-99 mg/dL 111 (H)  Anion gap Latest Ref Range: 5-15  8  WBC Latest Ref Range: 4.0-10.5 K/uL 5.8  RBC Latest Ref Range: 4.22-5.81 MIL/uL 2.72 (L)  Hemoglobin Latest Ref Range: 13.0-17.0 g/dL 8.7 (L)  HCT Latest Ref Range: 39.0-52.0 % 24.7 (L)  MCV Latest Ref Range: 78.0-100.0 fL 90.8  MCH Latest Ref Range: 26.0-34.0 pg 32.0  MCHC Latest Ref Range: 30.0-36.0 g/dL 35.2  RDW Latest Ref Range: 11.5-15.5 % 14.2  Platelets Latest Ref Range: 150-400 K/uL 102 (L)    Exam  Gen: resting comfortably in bed, NAD Lungs: breathing unlabored Cardiac: Reg Ext:       Left Lower Extremity   Dressing c/d/i  Mild swelling L leg  Ext warm  Motor and sensory functions intact  No DCT    Assessment and Plan   POD/HD#: 48  70 y/o male s/p staged revision L  TKA  WBAT Dc home today Follow up with Dr. Percell Miller in 2 weeks Renal function improved  apixaban for dvt  Prophylaxis   Jari Pigg, PA-C [facility] [phone] (P) [phone] (O) 11/28/2015 11:39 AM

## 2015-11-28 NOTE — Progress Notes (Signed)
Discharge instructions and prescriptions provided to patient.  Denied discharge related questions.  IV removed.  Patient discharged to home with home health set up.  Escorted by RN via wheelchair to vehicle.  Accompanied home by sister and wife.  All belonging with patient at the time of discharge.

## 2015-11-28 NOTE — Progress Notes (Addendum)
Occupational Therapy Treatment Patient Details Name: Marc Maldonado MRN: [id number] DOB: 03/26/1946 Today's Date: 11/28/2015    History of present illness 70 y.o. male s/p L total knee revision. 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   OT comments  Pt. Continues to progress with acute OT goals.  States nausea has subsided and he is feeling better.  Now performing toileting tasks and LB ADLS at S level of A.  Reports he has all DME and wife will be available to assist as needed.  Will notify OTR/L pt. Clear for d/c from acute OT.    Follow Up Recommendations  Home health OT;Supervision/Assistance - 24 hour    Equipment Recommendations  None recommended by OT    Recommendations for Other Services      Precautions / Restrictions Precautions Precautions: Knee;Fall Precaution Comments: Reviewed precautions Restrictions LLE Weight Bearing: Weight bearing as tolerated       Mobility Bed Mobility               General bed mobility comments: amb. out of b.room upon arrival into room  Transfers Overall transfer level: Needs assistance Equipment used: Rolling walker (2 wheeled) Transfers: Sit to/from [facility] Sit to Stand: Supervision Stand pivot transfers: Supervision            Balance                                   ADL Overall ADL's : Needs assistance/impaired             Lower Body Bathing: Set up;Sitting/lateral leans Lower Body Bathing Details (indicate cue type and reason): simulated and was able to reach b les seated by bending forward     Lower Body Dressing: Supervision/safety;Sitting/lateral leans Lower Body Dressing Details (indicate cue type and reason): pt. able to bend forward and don/doff socks seated with no need for A/E Toilet Transfer: Supervision/safety;Ambulation;Regular Toilet;BSC;RW Toilet Transfer Details (indicate cue type  and reason): bsc over the commode Toileting- Clothing Manipulation and Hygiene: Sitting/lateral lean;Supervision/safety       Functional mobility during ADLs: Min guard General ADL Comments: continues with improvement, states no nausea today. moving well in the room.  states he has all needed DME at home and has assist and support from wife if needed      Vision                     Perception     Praxis      Cognition     Overall Cognitive Status: Within Functional Limits for tasks assessed                       Extremity/Trunk Assessment               Exercises     Shoulder Instructions       General Comments      Pertinent Vitals/ Pain       Pain Assessment: No/denies pain  Home Living                                          Prior Functioning/Environment  Frequency Min 2X/week     Progress Toward Goals  OT Goals(current goals can now be found in the care plan section)  Progress towards OT goals: Progressing toward goals     Plan Discharge plan remains appropriate    Co-evaluation                 End of Session Equipment Utilized During Treatment: Rolling walker CPM Left Knee CPM Left Knee: On Left Knee Flexion (Degrees): 80 Left Knee Extension (Degrees): 0   Activity Tolerance Patient tolerated treatment well   Patient Left in chair;with call bell/phone within reach   Nurse Communication Other (comment) (bleeding present from L knee bandage, rn notified and came in room to assess)        Time: 0725-0737 OT Time Calculation (min): 12 min  Charges: OT General Charges $OT Visit: 1 Procedure OT Treatments $Self Care/Home Management : 8-22 mins  Janice Coffin, COTA/L 11/28/2015, 7:54 AM

## 2015-11-28 NOTE — Care Management Note (Addendum)
Case Management Note  Patient Details  Name: Marc Maldonado MRN: [id number] Date of Birth: 06/11/1946  Subjective/Objective:                  Left total knee replacement  Action/Plan: [name] at the bedside who states that he was previously setup with [facility] and would like to use them again. Patient said that he has all equipment needed including a walker, cane, BSC. Patient will be home with spouse who is able to provide needed assistance. Patient said that he does not have any medication needs and no further needs at this time. Cm called Butch Penny with [facility] [employer] to advise of [facility] services.   Expected Discharge Date:  11/28/15               Expected Discharge Plan:  [place]  In-House Referral:  NA  Discharge planning Services  CM Consult  Post Acute Care Choice:  Home Health Choice offered to:  Patient  DME Arranged:  CPM DME Agency:  [employer]  HH Arranged:  PT/OT HH Agency:  [facility]  Status of Service:  Completed, signed off  Medicare Important Message Given:  Yes Date Medicare IM Given:    Medicare IM give by:    Date Additional Medicare IM Given:    Additional Medicare Important Message give by:     If discussed at [place] of Stay Meetings, dates discussed:    Additional Comments:  Guido Sander, RN 11/28/2015, 9:47 AM

## 2015-11-28 NOTE — Progress Notes (Signed)
Physical Therapy Treatment Patient Details Name: Marc Maldonado MRN: [id number] DOB: 07-19-1946 Today's Date: 11/28/2015    History of Present Illness 70 y.o. male s/p L total knee revision. 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    PT Comments    Patient doing well with mobility and gait.  Ready for d/c from PT perspective.  Will have HHPT at d/c.  Follow Up Recommendations  Home health PT;Supervision - Intermittent     Equipment Recommendations  None recommended by PT    Recommendations for Other Services       Precautions / Restrictions Precautions Precautions: Knee;Fall Precaution Comments: Patient aware of knee precautions Restrictions Weight Bearing Restrictions: Yes LLE Weight Bearing: Weight bearing as tolerated    Mobility  Bed Mobility Overal bed mobility: Modified Independent Bed Mobility: Supine to Sit;Sit to Supine     Supine to sit: Modified independent (Device/Increase time) Sit to supine: Modified independent (Device/Increase time)   General bed mobility comments: No physical assist needed.  Transfers Overall transfer level: Modified independent Equipment used: Rolling walker (2 wheeled) Transfers: Sit to/from Stand Sit to Stand: Modified independent (Device/Increase time) Stand pivot transfers: Supervision       General transfer comment: Patient uses proper technique.  No physical assist needed.  Ambulation/Gait Ambulation/Gait assistance: Modified independent (Device/Increase time) Ambulation Distance (Feet): 140 Feet Assistive device: Rolling walker (2 wheeled) Gait Pattern/deviations: Step-through pattern;Decreased stance time - left;Decreased step length - right;Decreased stride length Gait velocity: decreased Gait velocity interpretation: Below normal speed for age/gender General Gait Details: Patient demonstrates safe use of RW and correct gait sequence.   Slow, steady gait.   Stairs Stairs:  (Patient declined - reports he has no difficulty with stairs)          Wheelchair Mobility    Modified Rankin (Stroke Patients Only)       Balance                                    Cognition Arousal/Alertness: Awake/alert Behavior During Therapy: WFL for tasks assessed/performed Overall Cognitive Status: Within Functional Limits for tasks assessed                      Exercises Total Joint Exercises Ankle Circles/Pumps: AROM;Both;10 reps;Supine Quad Sets: AROM;Left;10 reps;Supine Short Arc Quad: Left;10 reps;Supine;AROM Heel Slides: Left;10 reps;Supine;AROM Hip ABduction/ADduction: Left;10 reps;Supine;AROM Long Arc Quad: AROM;Left;10 reps;Seated Knee Flexion: AROM;Left;10 reps;Seated Goniometric ROM: -10 ext; 70 flex    General Comments        Pertinent Vitals/Pain Pain Assessment: 0-10 Pain Score: 3  Pain Location: Lt knee Pain Descriptors / Indicators: Sore;Tightness Pain Intervention(s): Monitored during session;Premedicated before session;Repositioned    Home Living                      Prior Function            PT Goals (current goals can now be found in the care plan section) Progress towards PT goals: Progressing toward goals    Frequency  7X/week    PT Plan Current plan remains appropriate    Co-evaluation             End of Session   Activity Tolerance: Patient tolerated treatment well Patient left: in bed;with call bell/phone within reach     Time:  [id number] PT Time Calculation (min) (ACUTE ONLY): 35 min  Charges:  $Gait Training: 8-22 mins $Therapeutic Exercise: 8-22 mins                    G Codes:      Despina Pole Dec 18, 2015, 11:13 AM Carita Pian. Sanjuana Kava, [place] Pager [phone]

## 2015-12-03 ENCOUNTER — Encounter (HOSPITAL_COMMUNITY): Payer: Self-pay | Admitting: Orthopedic Surgery

## 2015-12-07 ENCOUNTER — Encounter (HOSPITAL_COMMUNITY): Payer: Self-pay | Admitting: Orthopedic Surgery

## 2015-12-08 ENCOUNTER — Encounter (HOSPITAL_COMMUNITY): Payer: Self-pay | Admitting: Orthopedic Surgery

## 2015-12-08 NOTE — Anesthesia Postprocedure Evaluation (Signed)
Anesthesia Post Note  Patient: Marc Maldonado  Procedure(s) Performed: Procedure(s) (LRB): LEFT TOTAL KNEE REVISION (Left)  Patient location during evaluation: PACU Anesthesia Type: General Level of consciousness: sedated and patient cooperative Pain management: pain level controlled Vital Signs Assessment: post-procedure vital signs reviewed and stable Respiratory status: spontaneous breathing Cardiovascular status: stable Anesthetic complications: no    Last Vitals:  Filed Vitals:   11/28/15 0409 11/28/15 1237  BP: 135/74 156/85  Pulse: 66 93  Temp: 37.2 C   Resp: 18 18    Last Pain:  Filed Vitals:   11/28/15 1403  PainSc: 2                  Nolon Nations

## 2015-12-10 ENCOUNTER — Encounter (HOSPITAL_COMMUNITY): Payer: Self-pay | Admitting: Orthopedic Surgery

## 2015-12-30 ENCOUNTER — Ambulatory Visit: Payer: Medicare Other | Admitting: Infectious Disease

## 2016-01-11 ENCOUNTER — Ambulatory Visit: Payer: Medicare Other | Admitting: Infectious Disease

## 2016-02-02 ENCOUNTER — Encounter: Payer: Self-pay | Admitting: Infectious Disease

## 2016-03-17 IMAGING — CR DG KNEE 1-2V PORT*L*
3 series · 3 of 3 positions shown · non-contrast
Comparison: Preoperative radiographs 08/12/2015

CLINICAL DATA: 69-year-old male undergoing left knee arthroplasty
revision

EXAM:
PORTABLE LEFT KNEE - 1-2 VIEW

[AP]
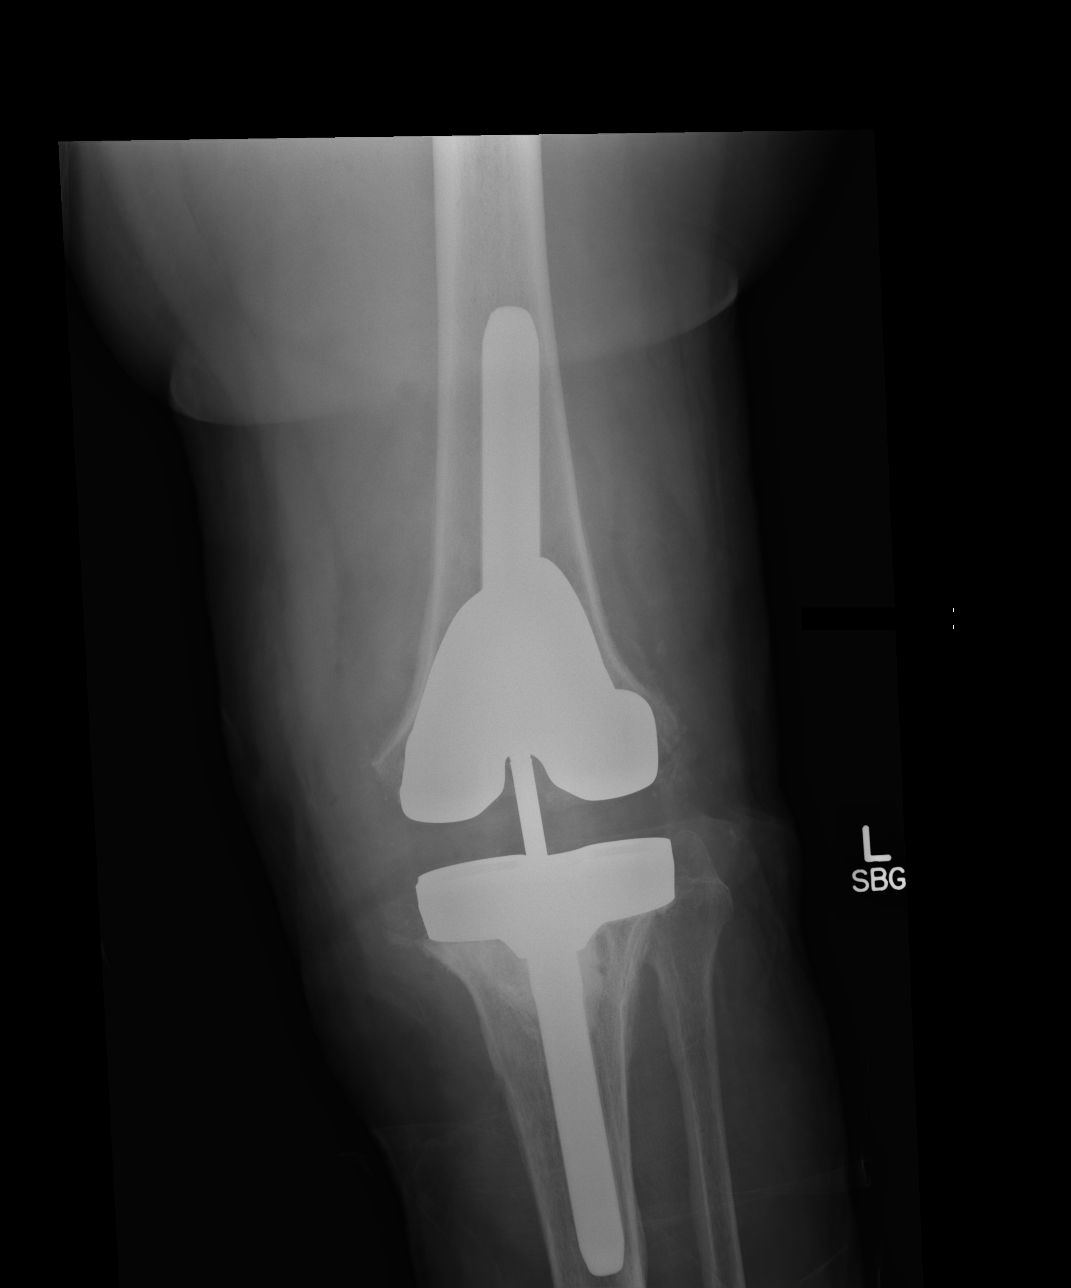

[xtable lateral (1 of 2)]
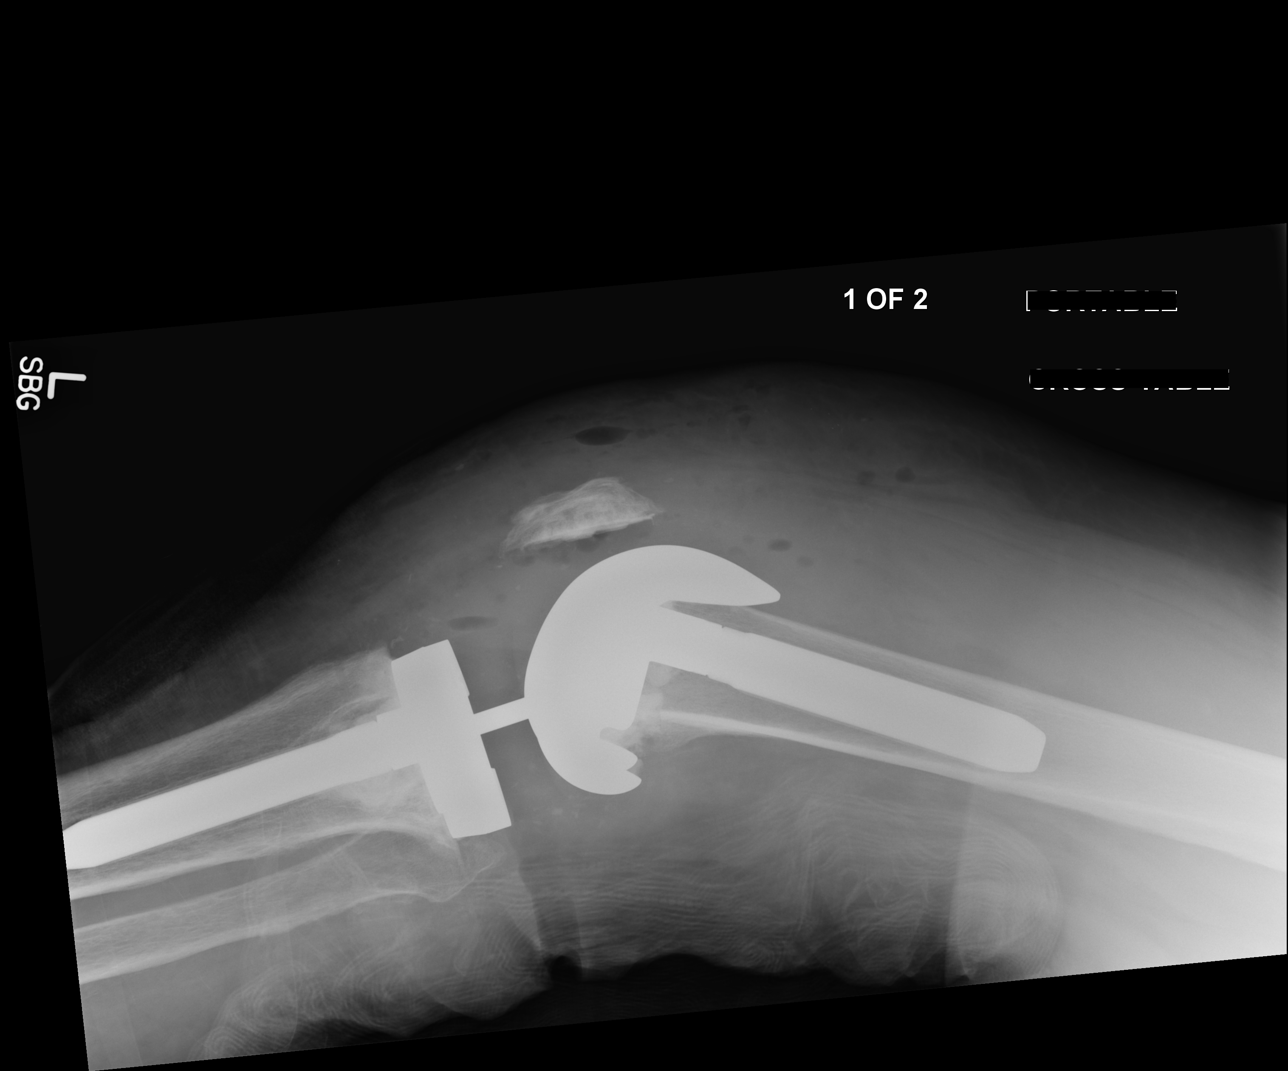

[xtable lateral (2 of 2)]
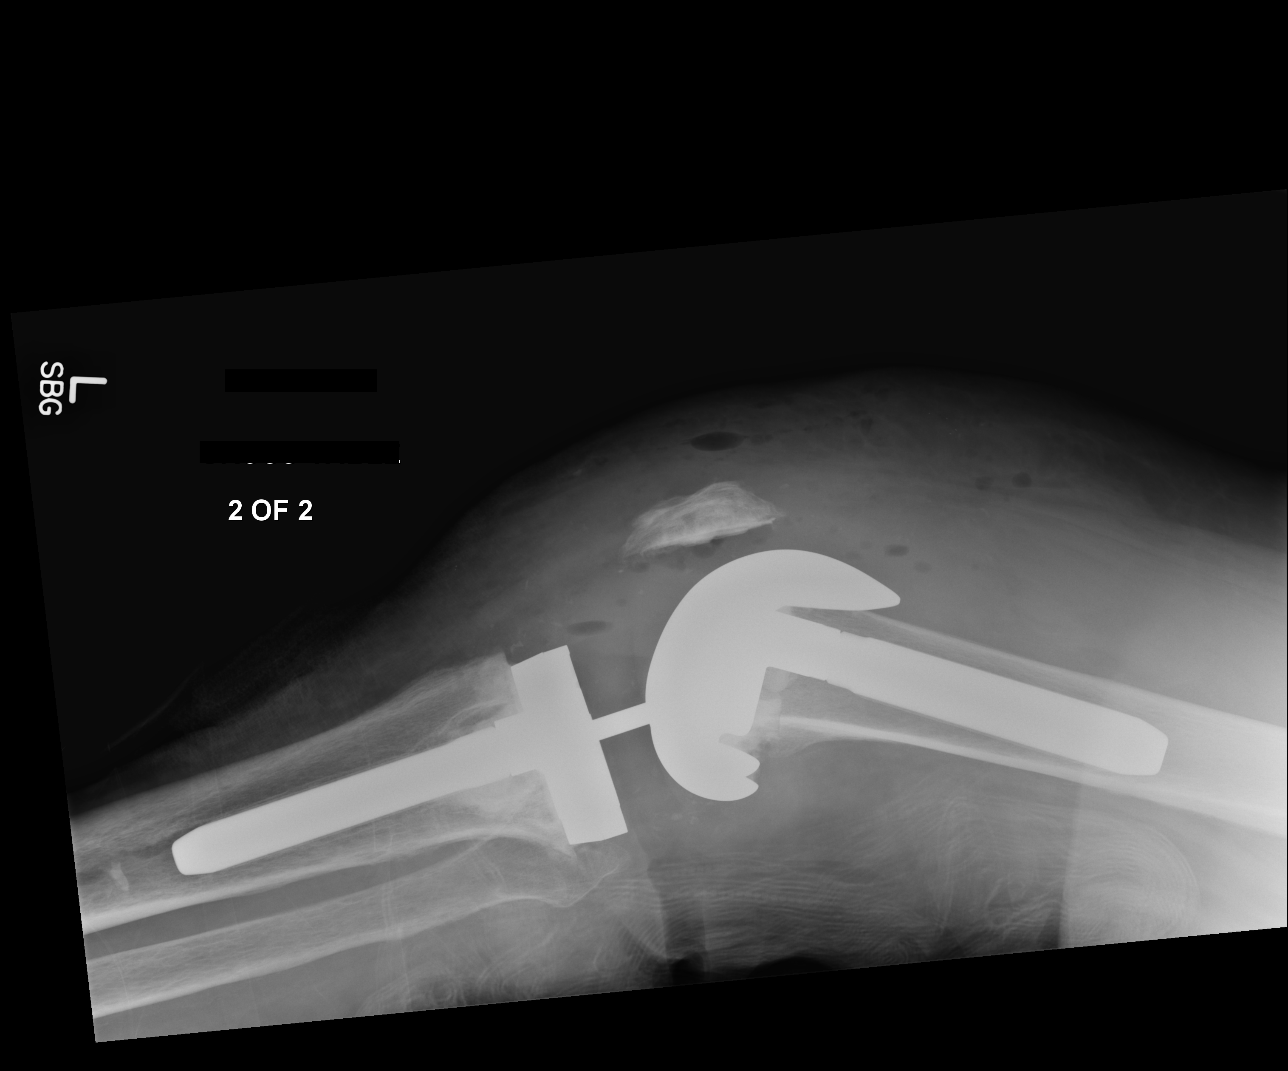

[3 of 3 positions shown; findings below may reference images not displayed]

FINDINGS: Interval removal of previous arthroplasty hardware. A new long stem
femoral and tibial component are present. Three intraoperative views
demonstrate no evidence of periprosthetic fracture or other
immediate hardware complication. There is soft tissue swelling about
the knee. Scattered subcutaneous emphysema not unexpected in the
acute postoperative period.
IMPRESSION: Revision left total knee arthroplasty with long stem femoral and
tibial components. No evidence of immediate complication.
